# Patient Record
Sex: Female | Born: 1950 | Race: White | Hispanic: No | Marital: Married | Smoking: Former smoker
Health system: Southern US, Community
[De-identification: ages and names within clinical notes are randomized; demographics above are authoritative.]

## PROBLEM LIST (undated history)

## (undated) DIAGNOSIS — I059 Rheumatic mitral valve disease, unspecified: Secondary | ICD-10-CM

## (undated) DIAGNOSIS — I080 Rheumatic disorders of both mitral and aortic valves: Secondary | ICD-10-CM

## (undated) DIAGNOSIS — E785 Hyperlipidemia, unspecified: Secondary | ICD-10-CM

## (undated) HISTORY — DX: Rheumatic disorders of both mitral and aortic valves: I08.0

## (undated) HISTORY — PX: ABDOMINAL HYSTERECTOMY: SHX81

## (undated) HISTORY — DX: Rheumatic mitral valve disease, unspecified: I05.9

## (undated) HISTORY — PX: BREAST BIOPSY: SHX20

## (undated) HISTORY — DX: Hyperlipidemia, unspecified: E78.5

## (undated) HISTORY — PX: BREAST EXCISIONAL BIOPSY: SUR124

## (undated) HISTORY — PX: CERVICAL SPINE SURGERY: SHX589

---

## 2004-12-05 ENCOUNTER — Ambulatory Visit: Payer: Self-pay | Admitting: Family Medicine

## 2005-08-20 ENCOUNTER — Encounter: Payer: Self-pay | Admitting: Emergency Medicine

## 2005-08-20 ENCOUNTER — Ambulatory Visit: Payer: Self-pay | Admitting: Cardiology

## 2005-08-21 ENCOUNTER — Inpatient Hospital Stay (HOSPITAL_COMMUNITY): Admission: AD | Admit: 2005-08-21 | Discharge: 2005-08-22 | Payer: Self-pay | Admitting: Cardiology

## 2005-08-21 ENCOUNTER — Encounter: Payer: Self-pay | Admitting: Internal Medicine

## 2005-08-27 ENCOUNTER — Ambulatory Visit: Payer: Self-pay | Admitting: Cardiology

## 2005-09-29 ENCOUNTER — Ambulatory Visit: Payer: Self-pay | Admitting: Cardiology

## 2005-12-11 ENCOUNTER — Ambulatory Visit: Payer: Self-pay

## 2006-10-09 DIAGNOSIS — Z79890 Hormone replacement therapy (postmenopausal): Secondary | ICD-10-CM | POA: Insufficient documentation

## 2006-10-09 DIAGNOSIS — M810 Age-related osteoporosis without current pathological fracture: Secondary | ICD-10-CM | POA: Insufficient documentation

## 2006-10-23 ENCOUNTER — Ambulatory Visit: Payer: Self-pay | Admitting: Cardiology

## 2006-12-15 ENCOUNTER — Ambulatory Visit: Payer: Self-pay

## 2007-05-19 ENCOUNTER — Ambulatory Visit: Payer: Self-pay | Admitting: Gastroenterology

## 2008-01-18 ENCOUNTER — Ambulatory Visit: Payer: Self-pay

## 2009-01-02 ENCOUNTER — Ambulatory Visit: Payer: Self-pay | Admitting: Family Medicine

## 2009-01-02 DIAGNOSIS — M6530 Trigger finger, unspecified finger: Secondary | ICD-10-CM | POA: Insufficient documentation

## 2009-01-18 ENCOUNTER — Ambulatory Visit: Payer: Self-pay | Admitting: Family Medicine

## 2009-03-19 DIAGNOSIS — I8390 Asymptomatic varicose veins of unspecified lower extremity: Secondary | ICD-10-CM | POA: Insufficient documentation

## 2009-10-19 ENCOUNTER — Encounter: Payer: Self-pay | Admitting: Cardiology

## 2009-10-30 ENCOUNTER — Encounter: Payer: Self-pay | Admitting: Cardiology

## 2009-11-01 DIAGNOSIS — I519 Heart disease, unspecified: Secondary | ICD-10-CM | POA: Insufficient documentation

## 2009-12-14 ENCOUNTER — Ambulatory Visit: Payer: Self-pay | Admitting: Family Medicine

## 2009-12-24 DIAGNOSIS — I4891 Unspecified atrial fibrillation: Secondary | ICD-10-CM | POA: Insufficient documentation

## 2009-12-24 DIAGNOSIS — I059 Rheumatic mitral valve disease, unspecified: Secondary | ICD-10-CM | POA: Insufficient documentation

## 2009-12-24 DIAGNOSIS — I341 Nonrheumatic mitral (valve) prolapse: Secondary | ICD-10-CM | POA: Insufficient documentation

## 2009-12-26 ENCOUNTER — Ambulatory Visit: Payer: Self-pay | Admitting: Cardiology

## 2009-12-26 DIAGNOSIS — I080 Rheumatic disorders of both mitral and aortic valves: Secondary | ICD-10-CM | POA: Insufficient documentation

## 2009-12-26 DIAGNOSIS — I428 Other cardiomyopathies: Secondary | ICD-10-CM | POA: Insufficient documentation

## 2010-01-03 ENCOUNTER — Ambulatory Visit: Payer: Self-pay | Admitting: Internal Medicine

## 2010-01-03 ENCOUNTER — Encounter: Payer: Self-pay | Admitting: Internal Medicine

## 2010-01-03 ENCOUNTER — Ambulatory Visit (HOSPITAL_COMMUNITY): Admission: RE | Admit: 2010-01-03 | Discharge: 2010-01-03 | Payer: Self-pay | Admitting: Internal Medicine

## 2010-01-09 ENCOUNTER — Telehealth: Payer: Self-pay | Admitting: Cardiology

## 2010-01-22 ENCOUNTER — Ambulatory Visit: Payer: Self-pay

## 2010-01-24 ENCOUNTER — Ambulatory Visit: Payer: Self-pay

## 2010-03-04 ENCOUNTER — Telehealth: Payer: Self-pay | Admitting: Cardiology

## 2010-04-09 ENCOUNTER — Ambulatory Visit: Payer: Self-pay | Admitting: Family Medicine

## 2010-04-15 ENCOUNTER — Ambulatory Visit: Payer: Self-pay | Admitting: Family Medicine

## 2010-04-16 DIAGNOSIS — M5412 Radiculopathy, cervical region: Secondary | ICD-10-CM | POA: Insufficient documentation

## 2010-04-17 ENCOUNTER — Telehealth: Payer: Self-pay | Admitting: Cardiology

## 2010-06-18 ENCOUNTER — Encounter: Payer: Self-pay | Admitting: Cardiology

## 2010-06-20 ENCOUNTER — Ambulatory Visit: Payer: Self-pay | Admitting: Cardiology

## 2010-07-02 ENCOUNTER — Ambulatory Visit: Payer: Self-pay

## 2010-07-02 ENCOUNTER — Ambulatory Visit: Payer: Self-pay | Admitting: Internal Medicine

## 2010-07-02 ENCOUNTER — Ambulatory Visit (HOSPITAL_COMMUNITY): Admission: RE | Admit: 2010-07-02 | Discharge: 2010-07-02 | Payer: Self-pay | Admitting: Cardiology

## 2010-07-02 ENCOUNTER — Encounter: Payer: Self-pay | Admitting: Cardiology

## 2010-07-29 ENCOUNTER — Telehealth: Payer: Self-pay | Admitting: Cardiology

## 2010-08-29 ENCOUNTER — Ambulatory Visit: Payer: Self-pay | Admitting: General Surgery

## 2010-10-15 NOTE — Miscellaneous (Signed)
  Clinical Lists Changes  Observations: Added new observation of TEEFINDING: - Left ventricle: Systolic function was normal. The estimated   ejection fraction was in the range of 50% to 55%. Hypokinesis of   the anteroseptal myocardium. - Mitral valve: Mild prolapse, involving the anterior leaflet and   the posterior leaflet. Mild regurgitation. - Atrial septum: There was a small patent foramen ovale. There was   an atrial septal aneurysm, predominantly within the right atrial   cavity. - Tricuspid valve: No evidence of vegetation. (01/03/2010 14:43)      TE Echocardiogram  Procedure date:  01/03/2010  Findings:      - Left ventricle: Systolic function was normal. The estimated   ejection fraction was in the range of 50% to 55%. Hypokinesis of   the anteroseptal myocardium. - Mitral valve: Mild prolapse, involving the anterior leaflet and   the posterior leaflet. Mild regurgitation. - Atrial septum: There was a small patent foramen ovale. There was   an atrial septal aneurysm, predominantly within the right atrial   cavity. - Tricuspid valve: No evidence of vegetation.

## 2010-10-15 NOTE — Progress Notes (Signed)
Summary: [facility] to review TEE results/waiting on report review from [facility]  Phone Note Call from Patient Call back at Home Phone [phone]   Caller: Patient Reason for Call: Talk to Nurse, Talk to Doctor, Lab or Test Results Summary of Call: pt would like TEE results she had this last Thursday and she was wondering when and who her next f/u would be with. If no one calls today please call after 4pm cause thats when she gets home Initial call taken by: Omer Jack,  January 09, 2010 4:14 PM  Follow-up for Phone Call        Talked with Mrs. Hardgrove and told her the results of her TEE were not available yet.   I told her Dr. Lindaann Slough nurse, Elita Quick, would call her with those results as well as if she needed a follow-up appt with Dr. Antoine Poche or her pcp.  Follow-up by: Lisabeth Devoid RN,  January 09, 2010 4:32 PM  Additional Follow-up for Phone Call Additional follow up Details #1::        received TEE results and taken to Dr Antoine Poche for review.  Sander Nephew, RN    Additional Follow-up for Phone Call Additional follow up Details #2::    I called the patient with results.  MR mild/mod.  I will follow with TTE in six months. Follow-up by: Rollene Rotunda, MD, [facility],  Jan 15, 2010 5:53 PM

## 2010-10-15 NOTE — Letter (Signed)
Summary: Cardioversion/TEE Instructions  Architectural technologist, Main Office  [address]   [place], [place] [address]   Phone: [phone]  Fax: [phone]        Cardioversion / TEE Cardioversion Instructions  You are scheduled for a Cardioversion / TEE Cardioversion on ____________________________ with Dr. _________________________________________.   Please arrive at the [facility] at _____________ a.m. / p.m. on the day of your procedure.  1)   DIET:  A)   Nothing to eat or drink after midnight except your medications with a sip of water.  B)   YOU MAY TAKE ALL of your remaining medications with a small amount of water.  2)  Must have a responsible person to drive you home.  3)   Bring a current list of your medications and current insurance cards.   * Special Note:  Every effort is made to have your procedure done on time. Occasionally there are emergencies that present themselves at the hospital that may cause delays. Please be patient if a delay does occur.  * If you have any questions after you get home, please call the office at [phone].

## 2010-10-15 NOTE — Assessment & Plan Note (Signed)
Summary: f53m   Visit Type:  Follow-up Primary Provider:  Maurine Minister Chrismon PA-C  CC:  Atrial Fib, MR, and MVP.  History of Present Illness: The patient presents for followup of mitral valve prolapse with mitral regurgitation. This summer she had a transesophageal ultrasound to followup a transthoracic echo done at [place]. The transesophageal suggested mild mitral regurgitation which contradicted to some degree the transthoracic suggesting moderate MR with a reduced EF. Ejection fraction by TTE was low normal at 50%. She does have some mild anterior leaf prolapse. The patient also has a history of atrial fibrillation. However, she has had no recurrent tachycardia palpitations and no suggestion of recurrent arrhythmia. She denies any new shortness of breath, PND or orthopnea. She has had no chest pressure, neck or arm discomfort.  Current Medications (verified): 1)  Propranolol Hcl 40 Mg Tabs (Propranolol Hcl) .Marland Kitchen.. 1 By Mouth Two Times A Day 2)  Diltiazem Hcl 120 Mg Tabs (Diltiazem Hcl) .Marland Kitchen.. 1 Po Daily 3)  Aspirin 325 Mg  Tabs (Aspirin) .Marland Kitchen.. 1 By Mouth Daily 4)  Vitamin D 2000 Unit Tabs (Cholecalciferol) .Marland Kitchen.. 1 By Mouth Daily 5)  Calcitonin (Salmon) 200 Unit/act Soln (Calcitonin (Salmon)) .... As Directed 6)  Stress Vitamin With Iron .Marland Kitchen.. 1 By Mouth Daily  Allergies (verified): 1)  ! Sulfa  Past History:  Past Medical History: Reviewed history from 12/26/2009 and no changes required.  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  Hysterectomy.  Review of Systems       As stated in the HPI and negative for all other systems.   Vital Signs:  Patient profile:   60 year old female Height:      66 inches Weight:      136 pounds BMI:     22.03 Pulse rate:   66 / minute Resp:     16 per minute BP  sitting:   102 / 60  (right arm)  Vitals Entered By: Marrion Coy, CNA (June 20, 2010 4:12 PM)  Physical Exam  General:  Well developed, well nourished, in no acute distress. Head:  normocephalic and atraumatic Eyes:  PERRLA/EOM intact; conjunctiva and lids normal. Neck:  Neck supple, no JVD. No masses, thyromegaly or abnormal cervical nodes. Chest Wall:  no deformities or breast masses noted Lungs:  Clear bilaterally to auscultation and percussion. Abdomen:  Bowel sounds positive; abdomen soft and non-tender without masses, organomegaly, or hernias noted. No hepatosplenomegaly. Msk:  Back normal, normal gait. Muscle strength and tone normal. Skin:  Intact without lesions or rashes. Cervical Nodes:  no significant adenopathy Axillary Nodes:  no significant adenopathy Inguinal Nodes:  no significant adenopathy Psych:  Normal affect.   Detailed Cardiovascular Exam  Neck    Carotids: Carotids full and equal bilaterally without bruits.      Neck Veins: Normal, no JVD.    Heart    Inspection: no deformities or lifts noted.      Palpation: normal PMI with no thrills palpable.      Auscultation: regular rate and rhythm, S1, S2 without murmurs, rubs, gallops, or clicks.    Vascular    Abdominal Aorta: no palpable masses, pulsations, or audible bruits.      Femoral Pulses: normal femoral pulses bilaterally.      Pedal Pulses: normal pedal pulses bilaterally.      Radial Pulses: normal radial pulses bilaterally.  Peripheral Circulation: no clubbing, cyanosis, or edema noted with normal capillary refill.     EKG  Procedure date:  06/20/2010  Findings:      Sinus rhythm, rate 68, axis within normal limits, intervals within normal limits, no acute ST-T wave changes.  Impression & Recommendations:  Problem # 1:  MITRAL INSUFFICIENCY (ICD-396.3) I will order a transthoracic echocardiogram to further evaluate this. If it continues to be mild I will follow this in one year.  Her discussed the anatomy and physiology in detail today. Orders: Echocardiogram (Echo)  Problem # 2:  FIBRILLATION, ATRIAL (ICD-427.31) She has had no recurrent tachycardia arrhythmias. Further evaluation is planned. Orders: EKG w/ Interpretation (93000)  Problem # 3:  CARDIOMYOPATHY (ICD-425.4) Her EF is low normal and she has no symptoms. No change in therapy is planned.  Patient Instructions: 1)  Your physician recommends that you schedule a follow-up appointment in: 12 months with Dr Antoine Poche 2)  Your physician recommends that you continue on your current medications as directed. Please refer to the Current Medication list given to you today. 3)  Your physician has requested that you have an echocardiogram.  Echocardiography is a painless test that uses sound waves to create images of your heart. It provides your doctor with information about the size and shape of your heart and how well your heart's chambers and valves are working.  This procedure takes approximately one hour. There are no restrictions for this procedure.

## 2010-10-15 NOTE — Progress Notes (Signed)
Summary: is pt scheduled to soon  Phone Note Call from Patient Call back at Home Phone [phone]   Caller: Patient Reason for Call: Talk to Nurse, Talk to Doctor Summary of Call: pt is scheduled for july 19th but she thinks this appt is too soon. I read to the last office note that said pt to schedule appt after TEE but she thought it said 6 months but I see no time frame Initial call taken by: Omer Jack,  March 04, 2010 11:56 AM  Follow-up for Phone Call        per TEE Dr Antoine Poche said f/u in 6 months, pt aware cx'd July appt put pt in for reminder for Oct. Meredith Staggers, RN  March 04, 2010 12:11 PM

## 2010-10-15 NOTE — Progress Notes (Signed)
Summary: Question about having dental work  Phone Note Call from Patient Call back at [employer] [phone]   Caller: Patient Summary of Call: Pt having dental work need to know if she needs premeds Initial call taken by: Judie Grieve,  April 17, 2010 1:28 PM  Follow-up for Phone Call        Pt. called to find out if she needs antibiotics prior dental work. Pt. states she has been diagnosed with mitral valve prolapse and mild regurgutation. I let pt. know according to the guidelines she will need antibiotics only if she has had a mitral valve repair or replacement. Pt. verbalized understanding. Follow-up by: Ollen Gross, RN, BSN,  April 17, 2010 2:55 PM

## 2010-10-15 NOTE — Assessment & Plan Note (Signed)
Summary: f3y/abn echo/mitral insuffiency   Visit Type:  Initial Consult Primary Provider:  Dortha Kern PA-C  CC:  MR.  History of Present Illness: The patient presents for evaluation of an abnormal echocardiogram. I saw her greater than 3 years ago after she was treated for atrial fibrillation requiring cardioversion. She had a history of mitral valve prolapse at that time but no regurgitation. She has not seen [place] in followup as there had been no indication. She has had no symptomatic recurrence of atrial fibrillation. She will get some rare palpitations. These are not sustained and not similar to her previous period  Recently she had a followup echocardiogram. This demonstrated mild global left ventricular dysfunction with an ejection fraction of 40%, moderate left atrial enlargement and moderate to severe mitral insufficiency. There was an atrial septal aneurysm which had previously been described. There was mild prolapse. She is referred for evaluation of this.  She gets along well working full time. She gets tired at work but this is not new. She does not have any shortness of breath and denies any PND or orthopnea. She does not have any chest discomfort, neck or arm discomfort. She does not have any palpitations, presyncope or syncope.  Current Medications (verified): 1)  Propranolol Hcl 40 Mg Tabs (Propranolol Hcl) .Marland Kitchen.. 1 By Mouth Two Times A Day 2)  Diltiazem Hcl 120 Mg Tabs (Diltiazem Hcl) .Marland Kitchen.. 1 Po Daily 3)  Aspirin 325 Mg  Tabs (Aspirin) .Marland Kitchen.. 1 By Mouth Daily 4)  Vitamin D 2000 Unit Tabs (Cholecalciferol) .Marland Kitchen.. 1 By Mouth Daily 5)  Calcitonin (Salmon) 200 Unit/act Soln (Calcitonin (Salmon)) .... As Directed 6)  Stress Vitamin With Iron .Marland Kitchen.. 1 By Mouth Daily  Allergies (verified): 1)  ! 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  Hysterectomy.  Family History: Reviewed history from 12/24/2009 and no changes required.  Contributory for coronary disease in later age. She does  have a family history of her dad and sister having thyroid problems.  Social History: Reviewed history from 12/24/2009 and no changes required.  The patient is married. She has 2 children. One grandchild.  She never smoked cigarettes.  Review of Systems       Positive for occasional leg cramping, reflux. Otherwise as stated in the history of present illness negative for all other systems.  Vital Signs:  Patient profile:   60 year old female Height:      66 inches Weight:      133 pounds BMI:     21.54 Pulse rate:   63 / minute Resp:     16 per minute BP sitting:   114 / 71  (left arm)  Vitals Entered By: Marrion Coy, CNA (December 26, 2009 4:20 PM)  Physical Exam  General:  Well developed, well nourished, in no acute distress. Head:  normocephalic and atraumatic Eyes:  PERRLA/EOM intact; conjunctiva and lids normal. Mouth:  Teeth, gums and palate normal. Oral mucosa normal. Neck:  Neck supple, no JVD. No masses, thyromegaly or abnormal cervical nodes. Chest Wall:  no deformities or breast masses noted Lungs:  Clear bilaterally to auscultation and percussion. Abdomen:  Bowel sounds positive; abdomen soft and non-tender without masses, organomegaly, or hernias noted. No hepatosplenomegaly. Msk:  Back normal, normal gait. Muscle strength and tone normal. Extremities:  No clubbing  or cyanosis. Neurologic:  Alert and oriented x 3. Skin:  Intact without lesions or rashes. Cervical Nodes:  no significant adenopathy Axillary Nodes:  no significant adenopathy Inguinal Nodes:  no significant adenopathy Psych:  Normal affect.   Detailed Cardiovascular Exam  Neck    Carotids: Carotids full and equal bilaterally without bruits.       Neck Veins: Normal, no JVD.    Heart    Inspection: no deformities or lifts noted.      Palpation: normal PMI with no thrills palpable.      Auscultation: regular rate and rhythm, S1, S2 without murmurs, rubs, gallops, or clicks.    Vascular    Abdominal Aorta: no palpable masses, pulsations, or audible bruits.      Femoral Pulses: normal femoral pulses bilaterally.      Pedal Pulses: normal pedal pulses bilaterally.      Radial Pulses: normal radial pulses bilaterally.      Peripheral Circulation: no clubbing, cyanosis, or edema noted with normal capillary refill.     EKG  Procedure date:  12/26/2009  Findings:      Sinus rhythm, rate 59, axis within normal limits, intervals within normal limits, no acute ST-T wave changes.  Impression & Recommendations:  Problem # 1:  MITRAL INSUFFICIENCY (ICD-396.3) The patient has mitral regurgitation identified on an outside echocardiogram done in [place]. I actually do not appreciate a murmur or a mitral valve prolapse click. However, given this finding on echo the next step is a transesophageal echocardiogram. She has no contraindications and I have discussed this with her. We will schedule this.  Problem # 2:  CARDIOMYOPATHY (ICD-425.4) We can reassess her ejection fraction at the time of the transesophageal echocardiogram. I will then prescribed therapy as indicated.  Problem # 3:  FIBRILLATION, ATRIAL (ICD-427.31) She has had no symptomatic recurrence of this. No further therapy is indicated.  Patient Instructions: 1)  Your physician recommends that you schedule a follow-up appointment AFTER TEE 2)  Your physician recommends that you continue on your current medications as directed. Please refer to the Current Medication list given to you today. 3)  Your physician has requested that you have a TEE/Cardioversion.  During a TEE, sound waves are used to create images of your heart. It provides your doctor with information about the size  and shape of your heart and how well your heart's chambers and valves are working. In this test, a transducer is attached to the end of a flexible tube that is guided down your throat and into your esophagus (the tube leading from your mouth to your stomach) to get a more detailed image of your heart. Once the TEE has determined that a blood clot is not present, the cardioversion begins.  Electrical cardioversion uses a jolt of electricity to your heart either through paddles or wired patches attached to your chest. This is a controlled, usually prescheduled, procedure. This procedure is done at the hospital and you are not awake during the procedure.  You usually go home the day of the procedure. Please see the instruction sheet given to you today for more information.  TO BE SCHEDULED

## 2010-10-15 NOTE — Progress Notes (Signed)
Summary: results of echo  Phone Note Call from Patient   Caller: Patient [phone] Reason for Call: Talk to Nurse, Lab or Test Results Summary of Call: pt calling re results of echo Initial call taken by: Glynda Jaeger,  July 29, 2010 4:23 PM  Follow-up for Phone Call        [facility] Katina Dung, RN, BSN  July 29, 2010 4:45 PM --I talked with pt

## 2010-10-15 NOTE — Consult Note (Signed)
Summary: [facility]   Imported By: Marylou Mccoy 02/07/2010 15:33:16  _____________________________________________________________________  External Attachment:    Type:   Image     Comment:   External Document

## 2011-01-31 NOTE — H&P (Signed)
Kristina Jordan, Kristina Jordan                ACCOUNT NO.:  [id number]   MEDICAL RECORD NO.:  [id number]          PATIENT TYPE:  EMS   LOCATION:  ED                           FACILITY:  [facility]   PHYSICIAN:  Rollene Rotunda, M.D.   DATE OF BIRTH:  [birth year]   DATE OF ADMISSION:  08/20/2005  DATE OF DISCHARGE:                                HISTORY & PHYSICAL   PRIMARY CARE PHYSICIAN:  Julieanne Manson, MD.   CARDIOLOGIST:  None.   REASON FOR PRESENTATION:  Patient with palpitations.   HISTORY OF PRESENT ILLNESS:  The patient is a very pleasant 60 year old  white female with a past history of mitral valve prolapse. She also has a  history of some dizziness that was evaluated with catheterization some time  ago. She was told she had normal coronary arteries.   Today she was playing with her new grandchild. She developed sudden  palpitations. Her daughter who is a Engineer, civil (consulting) noted her heart rate to be  irregular and suggested she come to the ER where she was noted to be in  atrial fibrillation with a rapid ventricular response. She had some mild  shortness of breath. She had some fleeting sharp chest discomfort. However,  she had no substernal chest pressure and no neck or arm discomfort. She had  no PND or orthopnea. She did not have any presyncope or syncope. Since  coming to the emergency room and having her heart rate slowed down with a  little IV diltiazem, she has had resolution of her symptoms though she  remains in atrial fibrillation. She has not noticed these before. She has  had isolated skipped beats over the years but these have never been  particularly problematic. Otherwise she is active and denies any  cardiovascular symptoms. In particular, she denies any chest discomfort,  neck discomfort, arm discomfort with activity, any nausea, vomiting, or  excessive diaphoresis.   PAST MEDICAL HISTORY:  Osteoporosis, mitral valve prolapse.   PAST SURGICAL HISTORY:  Hysterectomy.   ALLERGIES:  Questionably to sulfa.   MEDICATIONS:  1.  Propranolol 40 mg b.i.d.  2.  Premarin.  3.  Miacalcin nasal spray.  4.  Calcium.  5.  Vitamin D.   SOCIAL HISTORY:  The patient is married. She has 2 children. One grandchild.  She never smoked cigarettes.   FAMILY HISTORY:  Contributory for coronary disease in later age. She does  have a family history of her dad and sister having thyroid problems.   REVIEW OF SYMPTOMS:  Positive for recent URI treated with Z-pack. She has  had a dry nonprotective cough for quite a while. She has cold intolerance  that is chronic.   PHYSICAL EXAMINATION:  GENERAL:  The patient is in no distress.  VITAL SIGNS:  Blood pressure 103/77, heart rate 95 and irregular.  HEENT:  Eyes unremarkable. Pupils equal round and reactive to light, fundi  not visualized. Oral mucosa unremarkable.  NECK:  No jugular venous distention, wave form within normal limits, carotid  upstroke brisk and symmetric. No bruits, no thyromegaly.  LYMPHATICS:  No cervical, axillary or  inguinal adenopathy.  LUNGS:  Clear to auscultation bilaterally.  BACK:  No costovertebral angle tenderness.  CHEST:  Unremarkable.  HEART:  PMI not displaced with sustained, S1 and S2 within normal limits, no  S3, no murmurs.  ABDOMEN:  Flat, positive bowel sounds, normal in frequency and pitch, no  bruits, rebound, guarding, no midline pulsatile mass, no hepatosplenomegaly  or splenomegaly.  SKIN:  No rashes, no nodules.  EXTREMITIES:  2+ pulses throughout. No edema, no cyanosis, no clubbing.  NEUROLOGIC:  Oriented to person, place and time. Cranial nerves II-XII  grossly intact, motor grossly intact.   EKG, atrial fibrillation, acts within normal limits. Interval is within  normal limits, nonspecific inferior ST depression.   Chest x-ray questionable emphysema.   LABORATORY DATA:  WBC 5.6, hemoglobin 12.4, platelets 211, sodium 139,  potassium 3.6, chloride 106, BUN 14, creatinine  0.8, CK-MB 1.2, troponin  less than 0.05.   IMPRESSION:  1.  Atrial fibrillation, the patient had new onset atrial fibrillation. She      does have a history of mitral valve prolapse. At this point will check      an echocardiogram. Will follow her on telemetry. I will start heparin.      Will check a TSH. If her echocardiogram is unremarkable, she could be      considered to have lone atrial fibrillation and not require long-term      anticoagulation. If she does not convert spontaneously, she would need      cardioversion before discharge. Will rate control with Cardizem. I will      restart her propranolol. She could have a higher dose of propranolol at      discharge if her blood pressure tolerates for rate control.  2.  Mitral valve prolapse. Again this will be assessed with the      echocardiogram.  3.  Osteoporosis. Will continue her Miacalcin, vitamin D and calcium.           ______________________________  Rollene Rotunda, M.D.     JH/MEDQ  D:  08/21/2005  T:  08/21/2005  Job:  [id number]   cc:   Julieanne Manson  Fax: [phone]

## 2011-01-31 NOTE — Assessment & Plan Note (Signed)
[facility]                            CARDIOLOGY OFFICE NOTE   NAME:Kristina Jordan, Kristina Jordan                       MRN:          [id number]  DATE:10/23/2006                            DOB:          16-Mar-1951    PRIMARY CARE PHYSICIAN:  Dr. Julieanne Manson.   REASON FOR PRESENTATION:  The patient with atrial fibrillation.   HISTORY OF PRESENT ILLNESS:  The patient presents for followup of her  atrial fibrillation that required cardioversion in December. Since that  time, she has had no further sustained tachy palpitations. She may  occasionally feel her heart fluttering, but has had no significant  symptoms. She has had no pre-syncope or syncope. She denies any chest  pain. She has had no shortness of breath.   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   ALLERGIES:  Questionable allergy to SULFA.   MEDICATIONS:  1. Propranolol 40 mg b.i.d.  2. Aspirin 225 mg daily.  3. Calcium.  4. Miacalcin.  5. Premarin.  6. Multivitamin.  7. Cartia 100 mg daily.   REVIEW OF SYSTEMS:  As stated in the history of present illness,  otherwise negative for other systems.   PHYSICAL EXAMINATION:  GENERAL: The patient is in no distress.  VITAL SIGNS: Blood pressure 116/72, heart rate 62 and regular, weight  125 pounds, body mass index 20.  HEENT:  Eyelids unremarkable. Pupils equal, round, and reactive to  light, fundi are visualized, oral mucosa unremarkable.  NECK: No jugular venous distension at 45 degrees, carotid upstroke brisk  and symmetrical no bruits, no thyromegaly.  LYMPHATICS: No cervical, maxillary, inguinal adenopathy.  LUNGS: Clear to auscultation bilaterally.  BACK: No costovertebral angle tenderness.  CHEST: Unremarkable.  HEART: PMI not displaced or sustained, S1 and S2 within normal limits,  no S3, no S4, no  clicks, no rubs, no murmurs.  ABDOMEN: Flat, positive bowel sounds normal to frequency and pitch, no  bruits, no rebound, no guarding, no midline pulsatile mass, no  hepatomegaly or splenomegaly.  SKIN: No rashes, nodules.  EXTREMITIES: 2+ pulses, no cyanosis, clubbing, or edema.  NEUROLOGIC: Oriented to person, time and place, cranial nerves 2-12 are  grossly intact, motor grossly intact.   EKG sinus rhythm, rate 62, axis within normal limits, intervals within  normal limits, no acute STT wave changes.   IMPRESSION:  1. Atrial fibrillation. The patient has had no sustained paroxysms      with this. No further cardiovascular testing is suggested. She will      continue on the propranolol and the [place]. If she has any problems      with tachy palpitations she is to call our office.  2. Mitral valve prolapse, this is very mild. There is no evidence of      significant regurgitation. I do not hear any on physical      examination. This can be followed clinically.  3. Followup. We will see her back in this office or in our [facility]  office as needed.     Rollene Rotunda, MD, [facility]  Electronically Signed    JH/MedQ  DD: 10/23/2006  DT: 10/24/2006  Job #: [id number]   cc:   Julieanne Manson

## 2011-01-31 NOTE — Discharge Summary (Signed)
Kristina Jordan, Kristina Jordan                ACCOUNT NO.:  [id number]   MEDICAL RECORD NO.:  [id number]          PATIENT TYPE:  INP   LOCATION:  2030                         FACILITY:  [facility]   PHYSICIAN:  Maisie Fus Long            DATE OF BIRTH:  03-Jul-1951   DATE OF ADMISSION:  08/21/2005  DATE OF DISCHARGE:  08/22/2005                                 DISCHARGE SUMMARY   PRIMARY DIAGNOSIS:  Paroxysmal atrial fibrillation.   SECONDARY DIAGNOSES:  1.  History of mitral valve prolapse.  2.  Reported history of catheterization in the past that was normal.  No      further details available.  3.  History of osteoporosis.  4.  Status post hysterectomy.  5.  Anti-coagulation with aspirin, begun this admission.   ALLERGIES:  POSSIBLY SULFA.   PROCEDURE:  Direct current cardioversion.   HOSPITAL COURSE:  Kristina Jordan is a 60 year old female with no known history  of coronary artery disease.  She also has no history of arhythmia.  On the  day of admission, she was playing with her grandchild and developed  palpitations.  Her daughter, who is a Engineer, civil (consulting), noted that her heart beat was  irregular and when she came to the Emergency Room she was in atrial  fibrillation with rapid ventricular response.  Her heart rate slowed some  with IV Diltiazem and her symptoms resolved, but she remained in atrial  fibrillation.  She was admitted for further evaluation and treatment.   The next day, her potassium was 3.6.  Her point of care markers were  negative and her TSH was within normal limits at 0.786.  A 2-D  echocardiogram showed an EF of 55 to 65% with the mitral valve mildly  thickened with a mild myxomatous appearance and minimal mitral valve  prolapse noted only in the apical views.  The interatrial septum bowed from  left to right consistent with increased left atrial pressure and there was  an atrial septal aneurysm.  There was no paracardial effusion, and no other  valvular abnormalities.   On August 22, 2005, Kristina Jordan was evaluated by Dr. Daleen Squibb.  He felt that  cardioversion was the best option for her.  Anesthesia sedated her, and she  was cardioverted with 120 joules to normal sinus rhythm.  She is to start  Diltiazem SR 120 mg q. day, and an aspirin a day.  On August 22, 2005-p.m.,  she was maintaining sinus rhythm and considered stable for discharge with  outpatient follow-up arranged.   DISCHARGE INSTRUCTIONS:  Her activities be increased gradually.  She is to  stick to a low-fat diet.  She is to use steroid cream on the cardioversion  marks.  She is to follow-up with Dr. Jenene Slicker PA, or NP on August 27, 2005 at 8:45.   DISCHARGE MEDICATIONS:  1.  Diltiazem SR 120 mg q. day.  2.  Aspirin 325 mg a day.      Theodore Demark, P.A. [facility]    ______________________________  [name]    RB/MEDQ  D:  08/22/2005  T:  08/23/2005  Job:  [id number]   cc:   Julieanne Manson  Fax: [phone]

## 2011-01-31 NOTE — Discharge Summary (Signed)
NAMEANGELIA, Kristina Jordan                ACCOUNT NO.:  [id number]   MEDICAL RECORD NO.:  [id number]          PATIENT TYPE:  INP   LOCATION:  2030                         FACILITY:  [facility]   PHYSICIAN:  Jesse Sans. Wall, M.D.   DATE OF BIRTH:  12/29/1950   DATE OF ADMISSION:  08/21/2005  DATE OF DISCHARGE:                                 DISCHARGE SUMMARY   PROCEDURE:  Direct current cardioversion.   INDICATIONS FOR PROCEDURE:  New onset atrial fibrillation not converting  with intravenous Diltiazem and beta blockers.  The patient has a history of  mitral valve prolapse.  Echocardiogram shows trivial mitral regurgitation  with mild mitral valve prolapse.  Informed consent obtained.   DESCRIPTION OF PROCEDURE:  Dr. Roxine Caddy anesthetized the patient with 70 mg  IV Pentothal.  120 joules of biphasic current administered converting the  patient to sinus tach with PVCs.  She is now in sinus rhythm.  There were no  complications.   PLAN:  1.  Check 12 lead EKG.  2.  Diltiazem XR 180 mg p.o. now.  Will discontinue IV Diltiazem in one      hour.  3.  Continue Propranolol 40 mg b.i.d.  She was on this prior to admission.  4.  Discontinue heparin.  5.  Increase activity.  6.  Aspirin 325 mg p.o. daily then q.a.m.  7.  Discharge home later on today.  8.  Will schedule follow up with Dr. Rollene Rotunda.      Thomas C. Wall, M.D.  Electronically Signed     TCW/MEDQ  D:  08/22/2005  T:  08/22/2005  Job:  [id number]   cc:   Rollene Rotunda, M.D.  [address]  [address]  [place]  [place] [address]   Julieanne Manson  Fax: [phone]

## 2011-02-19 ENCOUNTER — Ambulatory Visit: Payer: Self-pay | Admitting: Family Medicine

## 2011-07-14 ENCOUNTER — Encounter: Payer: Self-pay | Admitting: *Deleted

## 2011-07-15 ENCOUNTER — Ambulatory Visit (INDEPENDENT_AMBULATORY_CARE_PROVIDER_SITE_OTHER): Payer: BC Managed Care – PPO | Admitting: Cardiology

## 2011-07-15 ENCOUNTER — Encounter: Payer: Self-pay | Admitting: Cardiology

## 2011-07-15 DIAGNOSIS — I4891 Unspecified atrial fibrillation: Secondary | ICD-10-CM

## 2011-07-15 DIAGNOSIS — I059 Rheumatic mitral valve disease, unspecified: Secondary | ICD-10-CM

## 2011-07-15 NOTE — Assessment & Plan Note (Signed)
I would like to follow this up with repeat echocardiography. Further treatment will be based on this result.

## 2011-07-15 NOTE — Patient Instructions (Signed)
Your physician has requested that you have an echocardiogram. Echocardiography is a painless test that uses sound waves to create images of your heart. It provides your doctor with information about the size and shape of your heart and how well your heart's chambers and valves are working. This procedure takes approximately one hour. There are no restrictions for this procedure.  Follow up in 1 year with Dr Hochrein.  You will receive a letter in the mail 2 months before you are due.  Please call us when you receive this letter to schedule your follow up appointment.  

## 2011-07-15 NOTE — Assessment & Plan Note (Signed)
She has had rare palpitations but no further episodes of fibrillation. No change in therapy is indicated.

## 2011-07-15 NOTE — Progress Notes (Signed)
HPI The patient presents for followup of an atrial septal aneurysm and mitral valve prolapse with mild MR. Since I last saw her she has had no new complaints. She has some rare palpitations. However, she has no presyncope or syncope. She denies any chest pressure, neck or arm discomfort. She has no new shortness of breath, PND or orthopnea. She doesn't exercise but she works a full-time job and walks they are without limitations. She's had no weight gain or edema.  Allergies  Allergen Reactions  . Sulfonamide Derivatives     Current Outpatient Prescriptions  Medication Sig Dispense Refill  . aspirin 325 MG tablet Take 325 mg by mouth daily.        . calcitonin, salmon, (MIACALCIN/FORTICAL) 200 UNIT/ACT nasal spray       . Cholecalciferol (VITAMIN D) 2000 UNITS CAPS Take 1 capsule by mouth daily.        Marland Kitchen diltiazem (CARDIZEM CD) 120 MG 24 hr capsule       . propranolol (INDERAL) 40 MG tablet Take 40 mg by mouth 2 (two) times daily.          Past Medical History  Diagnosis Date  . MITRAL VALVE PROLAPSE   . MITRAL INSUFFICIENCY   . FIBRILLATION, ATRIAL   . CARDIOMYOPATHY     Past Surgical History  Procedure Date  . Abdominal hysterectomy   . Cervical spine surgery     ROS:  As stated in the HPI and negative for all other systems.  PHYSICAL EXAM BP 103/59  Pulse 69  Resp 18  Ht 5\' 6"  (1.676 m)  Wt 136 lb (61.689 kg)  BMI 21.95 kg/m2 GENERAL:  Well appearing HEENT:  Pupils equal round and reactive, fundi not visualized, oral mucosa unremarkable NECK:  No jugular venous distention, waveform within normal limits, carotid upstroke brisk and symmetric, no bruits, no thyromegaly LYMPHATICS:  No cervical, inguinal adenopathy LUNGS:  Clear to auscultation bilaterally BACK:  No CVA tenderness CHEST:  Unremarkable HEART:  PMI not displaced or sustained,S1 and S2 within normal limits, no S3, no S4, no clicks, no rubs, no murmurs ABD:  Flat, positive bowel sounds normal in frequency  in pitch, no bruits, no rebound, no guarding, no midline pulsatile mass, no hepatomegaly, no splenomegaly EXT:  2 plus pulses throughout, no edema, no cyanosis no clubbing SKIN:  No rashes no nodules NEURO:  Cranial nerves II through XII grossly intact, motor grossly intact throughout PSYCH:  Cognitively intact, oriented to person place and time  EKG:  Sinus rhythm, rate 67, axis within normal limits, intervals within normal limits, no acute ST-T wave changes.  ASSESSMENT AND PLAN

## 2011-07-24 ENCOUNTER — Ambulatory Visit (HOSPITAL_COMMUNITY): Payer: BC Managed Care – PPO | Attending: Internal Medicine | Admitting: Radiology

## 2011-07-24 DIAGNOSIS — I059 Rheumatic mitral valve disease, unspecified: Secondary | ICD-10-CM | POA: Insufficient documentation

## 2011-07-24 DIAGNOSIS — I079 Rheumatic tricuspid valve disease, unspecified: Secondary | ICD-10-CM | POA: Insufficient documentation

## 2011-08-04 ENCOUNTER — Telehealth: Payer: Self-pay | Admitting: Cardiology

## 2011-08-04 NOTE — Telephone Encounter (Signed)
Reviewed echo results with pt

## 2011-08-04 NOTE — Telephone Encounter (Signed)
Pt calling about test results and you can leave message

## 2011-09-09 ENCOUNTER — Inpatient Hospital Stay (HOSPITAL_COMMUNITY)
Admission: EM | Admit: 2011-09-09 | Discharge: 2011-09-11 | DRG: 121 | Disposition: A | Discharge status: Home / Self Care | Payer: BC Managed Care – PPO | Attending: Internal Medicine | Admitting: Internal Medicine

## 2011-09-09 ENCOUNTER — Other Ambulatory Visit: Payer: Self-pay

## 2011-09-09 ENCOUNTER — Encounter (HOSPITAL_COMMUNITY): Payer: Self-pay

## 2011-09-09 ENCOUNTER — Emergency Department (HOSPITAL_COMMUNITY): Payer: BC Managed Care – PPO

## 2011-09-09 DIAGNOSIS — I428 Other cardiomyopathies: Secondary | ICD-10-CM | POA: Diagnosis present

## 2011-09-09 DIAGNOSIS — R0789 Other chest pain: Secondary | ICD-10-CM | POA: Diagnosis present

## 2011-09-09 DIAGNOSIS — I4891 Unspecified atrial fibrillation: Secondary | ICD-10-CM | POA: Diagnosis present

## 2011-09-09 DIAGNOSIS — I359 Nonrheumatic aortic valve disorder, unspecified: Secondary | ICD-10-CM | POA: Diagnosis present

## 2011-09-09 DIAGNOSIS — I341 Nonrheumatic mitral (valve) prolapse: Secondary | ICD-10-CM | POA: Diagnosis present

## 2011-09-09 DIAGNOSIS — I214 Non-ST elevation (NSTEMI) myocardial infarction: Principal | ICD-10-CM | POA: Diagnosis not present

## 2011-09-09 DIAGNOSIS — I059 Rheumatic mitral valve disease, unspecified: Secondary | ICD-10-CM | POA: Diagnosis present

## 2011-09-09 DIAGNOSIS — R079 Chest pain, unspecified: Secondary | ICD-10-CM

## 2011-09-09 DIAGNOSIS — Z7982 Long term (current) use of aspirin: Secondary | ICD-10-CM

## 2011-09-09 HISTORY — DX: Non-ST elevation (NSTEMI) myocardial infarction: I21.4

## 2011-09-09 LAB — URINALYSIS, ROUTINE W REFLEX MICROSCOPIC
Glucose, UA: NEGATIVE mg/dL
Protein, ur: NEGATIVE mg/dL

## 2011-09-09 LAB — DIFFERENTIAL
Basophils Relative: 0 % (ref 0–1)
Eosinophils Absolute: 0.4 10*3/uL (ref 0.0–0.7)
Eosinophils Relative: 6 % — ABNORMAL HIGH (ref 0–5)
Monocytes Absolute: 0.5 10*3/uL (ref 0.1–1.0)
Monocytes Relative: 9 % (ref 3–12)

## 2011-09-09 LAB — LIPID PANEL
Cholesterol: 156 mg/dL (ref 0–200)
HDL: 63 mg/dL (ref >39)
Total CHOL/HDL Ratio: 2.5 RATIO
Triglycerides: 144 mg/dL (ref <150)

## 2011-09-09 LAB — COMPREHENSIVE METABOLIC PANEL
Albumin: 3.4 g/dL — ABNORMAL LOW (ref 3.5–5.2)
BUN: 21 mg/dL (ref 6–23)
Chloride: 104 mEq/L (ref 96–112)
Creatinine, Ser: 0.77 mg/dL (ref 0.50–1.10)
Total Bilirubin: 0.2 mg/dL — ABNORMAL LOW (ref 0.3–1.2)
Total Protein: 6.7 g/dL (ref 6.0–8.3)

## 2011-09-09 LAB — CARDIAC PANEL(CRET KIN+CKTOT+MB+TROPI)
CK, MB: 5.9 ng/mL — ABNORMAL HIGH (ref 0.3–4.0)
Total CK: 70 U/L (ref 7–177)

## 2011-09-09 LAB — CBC
HCT: 35.8 % — ABNORMAL LOW (ref 36.0–46.0)
Hemoglobin: 11.8 g/dL — ABNORMAL LOW (ref 12.0–15.0)
MCH: 28.2 pg (ref 26.0–34.0)
MCHC: 33 g/dL (ref 30.0–36.0)
MCV: 85.6 fL (ref 78.0–100.0)

## 2011-09-09 LAB — URINE MICROSCOPIC-ADD ON

## 2011-09-09 LAB — TSH: TSH: 0.895 u[IU]/mL (ref 0.350–4.500)

## 2011-09-09 LAB — POCT I-STAT TROPONIN I

## 2011-09-09 MED ORDER — ONDANSETRON HCL 4 MG PO TABS: 4.0000 mg | ORAL_TABLET | Freq: Four times a day (QID) | ORAL | Status: DC | PRN

## 2011-09-09 MED ORDER — ONDANSETRON HCL 4 MG/2ML IJ SOLN: 4.0000 mg | Freq: Four times a day (QID) | INTRAMUSCULAR | Status: DC | PRN

## 2011-09-09 MED ORDER — CALCITONIN (SALMON) 200 UNIT/ACT NA SOLN
1.0000 | Freq: Every day | NASAL | Status: DC
  Administered 2011-09-09 – 2011-09-11 (×3): 1 via NASAL
  Filled 2011-09-09: qty 3.7

## 2011-09-09 MED ORDER — NITROGLYCERIN 0.4 MG SL SUBL: 0.4000 mg | SUBLINGUAL_TABLET | SUBLINGUAL | Status: DC | PRN

## 2011-09-09 MED ORDER — HYDROCODONE-ACETAMINOPHEN 5-325 MG PO TABS: 1.0000 | ORAL_TABLET | ORAL | Status: DC | PRN

## 2011-09-09 MED ORDER — HEPARIN SOD (PORCINE) IN D5W 100 UNIT/ML IV SOLN
800.0000 [IU]/h | INTRAVENOUS | Status: DC
  Administered 2011-09-09: 700 [IU]/h via INTRAVENOUS
  Administered 2011-09-10: 750 [IU]/h via INTRAVENOUS
  Filled 2011-09-09: qty 250

## 2011-09-09 MED ORDER — VITAMIN D 50 MCG (2000 UT) PO CAPS: 1.0000 | ORAL_CAPSULE | Freq: Every day | ORAL | Status: DC

## 2011-09-09 MED ORDER — PANTOPRAZOLE SODIUM 40 MG PO TBEC
40.0000 mg | DELAYED_RELEASE_TABLET | Freq: Every day | ORAL | Status: DC
  Administered 2011-09-09 – 2011-09-11 (×3): 40 mg via ORAL
  Filled 2011-09-09 (×3): qty 1

## 2011-09-09 MED ORDER — SIMVASTATIN 20 MG PO TABS
20.0000 mg | ORAL_TABLET | Freq: Every day | ORAL | Status: DC
  Administered 2011-09-09: 20 mg via ORAL
  Filled 2011-09-09 (×2): qty 1

## 2011-09-09 MED ORDER — SODIUM CHLORIDE 0.9 % IV SOLN
250.0000 mL | INTRAVENOUS | Status: DC | PRN
  Administered 2011-09-09: 250 mL via INTRAVENOUS

## 2011-09-09 MED ORDER — ALUM & MAG HYDROXIDE-SIMETH 200-200-20 MG/5ML PO SUSP: 30.0000 mL | Freq: Four times a day (QID) | ORAL | Status: DC | PRN

## 2011-09-09 MED ORDER — HEPARIN BOLUS VIA INFUSION
3000.0000 [IU] | Freq: Once | INTRAVENOUS | Status: AC
  Administered 2011-09-09: 3000 [IU] via INTRAVENOUS
  Filled 2011-09-09: qty 3000

## 2011-09-09 MED ORDER — ASPIRIN 325 MG PO TABS
325.0000 mg | ORAL_TABLET | Freq: Every day | ORAL | Status: AC
  Administered 2011-09-09 – 2011-09-10 (×2): 325 mg via ORAL
  Filled 2011-09-09 (×2): qty 1

## 2011-09-09 MED ORDER — PROPRANOLOL HCL 40 MG PO TABS
40.0000 mg | ORAL_TABLET | Freq: Two times a day (BID) | ORAL | Status: DC
Start: 2011-09-09 — End: 2011-09-11
  Administered 2011-09-09 – 2011-09-11 (×4): 40 mg via ORAL
  Filled 2011-09-09 (×6): qty 1

## 2011-09-09 MED ORDER — SODIUM CHLORIDE 0.9 % IJ SOLN: 3.0000 mL | INTRAMUSCULAR | Status: DC | PRN

## 2011-09-09 MED ORDER — VITAMIN D3 25 MCG (1000 UNIT) PO TABS
2000.0000 [IU] | ORAL_TABLET | Freq: Every day | ORAL | Status: DC
  Administered 2011-09-09 – 2011-09-11 (×3): 2000 [IU] via ORAL
  Filled 2011-09-09 (×3): qty 2

## 2011-09-09 MED ORDER — DILTIAZEM HCL ER COATED BEADS 120 MG PO CP24
120.0000 mg | ORAL_CAPSULE | Freq: Every day | ORAL | Status: DC
Start: 2011-09-09 — End: 2011-09-11
  Administered 2011-09-09 – 2011-09-11 (×3): 120 mg via ORAL
  Filled 2011-09-09 (×3): qty 1

## 2011-09-09 MED ORDER — SODIUM CHLORIDE 0.9 % IJ SOLN
3.0000 mL | Freq: Two times a day (BID) | INTRAMUSCULAR | Status: DC
  Administered 2011-09-09 – 2011-09-11 (×4): 3 mL via INTRAVENOUS

## 2011-09-09 NOTE — Consult Note (Signed)
Reason for Consult:Chest pain  Referring Physician: [name], Jordan is an 60 y.o. female.   HPI: 60 y/o female with a PMH of paroxysmal AFIB (on Propranolol and Diltiazem for rate contro-currently in sinus rhythml), MVP with mild mitral regurgitation who was admitted for chest pain that started at about 1 a.m. On 09/09/2011.  Pain is described as "hard" (could not elaborate any further) 7/10 in severity.  It lasted about 20 minutes before spontaneous relief (after chewing aspirin).  She continues to have intermittent chest pain on and off but of lower severity than the initial chest pain that lead to her admission. Her cardiac markers were position with CKMB 5.9 and troponin of 0.94. Prior TTE from 07/24/11 showed LVEF of 60-65%.  Past Medical History  Diagnosis Date  . MITRAL VALVE PROLAPSE   . MITRAL INSUFFICIENCY   . FIBRILLATION, ATRIAL   . CARDIOMYOPATHY   . Atrial septal aneurysm     Past Surgical History  Procedure Date  . Abdominal hysterectomy   . Cervical spine surgery     Family History  Problem Relation Age of Onset  . Coronary artery disease    . Thyroid disease    . Multiple sclerosis Sister   . Heart attack Mother     Social History:  reports that she has quit smoking. She does not have any smokeless tobacco history on file. She reports that she does not drink alcohol or use illicit drugs.  Allergies:  Allergies  Allergen Reactions  . Sulfonamide Derivatives Rash    Medications: Reviewed  Results for orders placed during the hospital encounter of 09/09/11 (from the past 48 hour(s))  CBC     Status: Abnormal   Collection Time   09/09/11  2:59 AM      Component Value Range Comment   WBC 5.6  4.0 - 10.5 (K/uL)    RBC 4.18  3.87 - 5.11 (MIL/uL)    Hemoglobin 11.8 (*) 12.0 - 15.0 (g/dL)    HCT 04.5 (*) 40.9 - 46.0 (%)    MCV 85.6  78.0 - 100.0 (fL)    MCH 28.2  26.0 - 34.0 (pg)    MCHC 33.0  30.0 - 36.0 (g/dL)    RDW 81.1  91.4 - 78.2 (%)     Platelets 190  150 - 400 (K/uL)   DIFFERENTIAL     Status: Abnormal   Collection Time   09/09/11  2:59 AM      Component Value Range Comment   Neutrophils Relative 63  43 - 77 (%)    Neutro Abs 3.5  1.7 - 7.7 (K/uL)    Lymphocytes Relative 22  12 - 46 (%)    Lymphs Abs 1.2  0.7 - 4.0 (K/uL)    Monocytes Relative 9  3 - 12 (%)    Monocytes Absolute 0.5  0.1 - 1.0 (K/uL)    Eosinophils Relative 6 (*) 0 - 5 (%)    Eosinophils Absolute 0.4  0.0 - 0.7 (K/uL)    Basophils Relative 0  0 - 1 (%)    Basophils Absolute 0.0  0.0 - 0.1 (K/uL)   COMPREHENSIVE METABOLIC PANEL     Status: Abnormal   Collection Time   09/09/11  2:59 AM      Component Value Range Comment   Sodium 139  135 - 145 (mEq/L)    Potassium 3.9  3.5 - 5.1 (mEq/L)    Chloride 104  96 - 112 (mEq/L)  CO2 27  19 - 32 (mEq/L)    Glucose, Bld 96  70 - 99 (mg/dL)    BUN 21  6 - 23 (mg/dL)    Creatinine, Ser 1.61  0.50 - 1.10 (mg/dL)    Calcium 09.6  8.4 - 10.5 (mg/dL)    Total Protein 6.7  6.0 - 8.3 (g/dL)    Albumin 3.4 (*) 3.5 - 5.2 (g/dL)    AST 23  0 - 37 (U/L)    ALT 18  0 - 35 (U/L)    Alkaline Phosphatase 102  39 - 117 (U/L)    Total Bilirubin 0.2 (*) 0.3 - 1.2 (mg/dL)    GFR calc non Af Amer 88 (*) >90 (mL/min)    GFR calc Af Amer >90  >90 (mL/min)   POCT I-STAT TROPONIN I     Status: Normal   Collection Time   09/09/11  3:19 AM      Component Value Range Comment   Troponin i, poc 0.00  0.00 - 0.08 (ng/mL)    Comment 3            URINALYSIS, ROUTINE W REFLEX MICROSCOPIC     Status: Abnormal   Collection Time   09/09/11  3:41 AM      Component Value Range Comment   Color, Urine YELLOW  YELLOW     APPearance CLEAR  CLEAR     Specific Gravity, Urine 1.005  1.005 - 1.030     pH 6.0  5.0 - 8.0     Glucose, UA NEGATIVE  NEGATIVE (mg/dL)    Hgb urine dipstick NEGATIVE  NEGATIVE     Bilirubin Urine NEGATIVE  NEGATIVE     Ketones, ur NEGATIVE  NEGATIVE (mg/dL)    Protein, ur NEGATIVE  NEGATIVE (mg/dL)     Urobilinogen, UA 0.2  0.0 - 1.0 (mg/dL)    Nitrite NEGATIVE  NEGATIVE     Leukocytes, UA TRACE (*) NEGATIVE    URINE MICROSCOPIC-ADD ON     Status: Normal   Collection Time   09/09/11  3:41 AM      Component Value Range Comment   Squamous Epithelial / LPF RARE  RARE     WBC, UA 0-2  <3 (WBC/hpf)    Bacteria, UA RARE  RARE    TSH     Status: Normal   Collection Time   09/09/11  8:45 AM      Component Value Range Comment   TSH 0.895  0.350 - 4.500 (uIU/mL)   CARDIAC PANEL(CRET KIN+CKTOT+MB+TROPI)     Status: Abnormal   Collection Time   09/09/11  3:24 PM      Component Value Range Comment   Total CK 70  7 - 177 (U/L)    CK, MB 5.9 (*) 0.3 - 4.0 (ng/mL)    Troponin I 0.94 (*) <0.30 (ng/mL)    Relative Index RELATIVE INDEX IS INVALID  0.0 - 2.5    LIPID PANEL     Status: Normal   Collection Time   09/09/11  4:46 PM      Component Value Range Comment   Cholesterol 156  0 - 200 (mg/dL)    Triglycerides 045  <150 (mg/dL)    HDL 63  >40 (mg/dL)    Total CHOL/HDL Ratio 2.5      VLDL 29  0 - 40 (mg/dL)    LDL Cholesterol 64  0 - 99 (mg/dL)     Dg Chest 2 View  98/07/9146  *  RADIOLOGY REPORT*  Clinical Data: Chest pain.  CHEST - 2 VIEW  Comparison: Chest radiograph performed 08/20/2005  Findings: The lungs are hyperexpanded, with flattening of the hemidiaphragms, compatible with COPD.  There is no evidence of focal opacification, pleural effusion or pneumothorax.  Bilateral nipple shadows are seen, more prominent on the right.  The heart is normal in size; the mediastinal contour is within normal limits.  No acute osseous abnormalities are seen. Cervical spinal fusion hardware is noted.  IMPRESSION: Findings of COPD; no acute cardiopulmonary process seen.  Original Report Authenticated By: Tonia Ghent, M.D.    Review of Systems  Constitutional: Negative.   HENT: Negative.   Eyes: Negative.   Respiratory: Negative.   Cardiovascular: Positive for chest pain. Negative for  palpitations, orthopnea, claudication, leg swelling and PND.  Gastrointestinal: Negative.   Genitourinary: Negative.   Musculoskeletal: Negative.   Skin: Negative.   Neurological: Negative.   Psychiatric/Behavioral: Negative.    Blood pressure 102/54, pulse 78, temperature 98.9 F (37.2 C), temperature source Oral, resp. rate 18, height 5\' 5"  (1.651 m), weight 57.9 kg (127 lb 10.3 oz), SpO2 98.00%. Physical Exam  Constitutional: She is oriented to person, place, and time. She appears well-developed and well-nourished.  HENT:  Head: Normocephalic.  Eyes: EOM are normal.  Neck: Normal range of motion.  Cardiovascular: Normal rate, regular rhythm, normal heart sounds and intact distal pulses.   Respiratory: Effort normal and breath sounds normal. No respiratory distress. She has no wheezes. She has no rales. She exhibits no tenderness.  GI: Soft. Bowel sounds are normal.  Musculoskeletal: Normal range of motion.  Neurological: She is alert and oriented to person, place, and time.  Skin: Skin is dry.   ECG: Sinus rhythm with no ST/T-wave changes.  Assessment:  1. NSTEMI 2. MVP with mild mitral regurgitation 3. History of paroxymal AFIB-currently in sinus rhythm  Plan:  1. Agree with Aspirin, but at 81 mg every Daily 2. Agree with heparin as you are already doing 3. Start Metoprolol at 25mg  b.i.d and d/c propranolol 4.  Wean Diltiazem as you are maximizing metoprolol 5. Continue to cycle cardiac markers 6. If recurrent chest pain, consider nitro paste to chest wall 7. Fasting lipids and LFTs in a.m. 8.  NPO past midnight for possible cardiac cath in a.m.  Emile Ringgenberg E 09/09/2011, 6:24 PM

## 2011-09-09 NOTE — ED Notes (Signed)
Pt states that she fell asleep in a chair, and awoke and then had chest pain

## 2011-09-09 NOTE — H&P (Signed)
PCP:   Raynelle Jan., MD   Chief Complaint: Chest pain   HPI: Kristina Jordan is an 60 y.o. female with history for mitral valve prolapse, atrial septal aneurysm, mitral regurg, cardiomyopathy, proximal atrial fibrillation, but no known coronary artery disease, presents to the emergency room with 2 episodes of substernal chest pain lasting 30 minutes each. She denied shortness of breath, nausea, diaphoresis, black stool, bloody stool, fever, chills, leg swelling, or calf tenderness. She has been active and denied any exertional chest pain. She still works  at [employer] and denied having any chest discomfort. She has no diabetes, hypertension, hyperlipidemia, or premature coronary disease in her family. Workup included negative cardiac markers, a normal EKG, and a chest x-ray only showed evidence of COPD. Hospitalists was asked to admit her for cardiac rule out.  Rewiew of Systems:  The patient denies anorexia, fever, weight loss,, vision loss, decreased hearing, hoarseness, chest pain, syncope, dyspnea on exertion, peripheral edema, balance deficits, hemoptysis, abdominal pain, melena, hematochezia, hematuria, incontinence, genital sores, muscle weakness, suspicious skin lesions, transient blindness, difficulty walking, depression, unusual weight change, abnormal bleeding, enlarged lymph nodes, angioedema, and breast masses.   Past Medical History  Diagnosis Date  . MITRAL VALVE PROLAPSE   . MITRAL INSUFFICIENCY   . FIBRILLATION, ATRIAL   . CARDIOMYOPATHY   . Atrial septal aneurysm     Past Surgical History  Procedure Date  . Abdominal hysterectomy   . Cervical spine surgery     Medications:  HOME MEDS: Prior to Admission medications   Medication Sig Start Date End Date Taking? Authorizing Provider  aspirin 325 MG tablet Take 325 mg by mouth daily.     Yes Historical Provider, MD  calcitonin, salmon, (MIACALCIN/FORTICAL) 200 UNIT/ACT nasal spray Place 1 spray into the nose  daily.  04/28/11  Yes Historical Provider, MD  Cholecalciferol (VITAMIN D) 2000 UNITS CAPS Take 1 capsule by mouth daily.     Yes Historical Provider, MD  diltiazem (CARDIZEM CD) 120 MG 24 hr capsule Take 120 mg by mouth daily.  05/31/11  Yes Historical Provider, MD  Multiple Vitamins-Iron (STRESS B/IRON) TABS Take 1 tablet by mouth daily.    Yes Historical Provider, MD  propranolol (INDERAL) 40 MG tablet Take 40 mg by mouth 2 (two) times daily.     Yes Historical Provider, MD     Allergies:  Allergies  Allergen Reactions  . Sulfonamide Derivatives Rash    Social History:   reports that she has quit smoking. She does not have any smokeless tobacco history on file. She reports that she does not drink alcohol or use illicit drugs. 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  Family History: Family History  Problem Relation Age of Onset  . Coronary artery disease    . Thyroid disease     her mother had a massive MI at age 34.   Physical Exam: Filed Vitals:   09/09/11 0500 09/09/11 0515 09/09/11 0530 09/09/11 0545  BP: 101/56 103/54 103/54 105/55  Pulse: 65 65 73 69  Temp:      TempSrc:      Resp: 18 13 17 17   Height:      Weight:      SpO2: 97% 96% 98% 98%   Blood pressure 105/55, pulse 69, temperature 97.7 F (36.5 C), temperature source Oral, resp. rate 17, height 5\' 5"  (1.651 m), weight 58.968 kg (130 lb), SpO2 98.00%.  GEN:  Pleasant  person lying in the  stretcher in no acute distress; cooperative with exam PSYCH:  alert and oriented x4; does not appear anxious does not appear depressed; affect is normal HEENT: Mucous membranes pink and anicteric; PERRLA; EOM intact; no cervical lymphadenopathy nor thyromegaly or carotid bruit; no JVD; Breasts:: Not examined CHEST WALL: No tenderness CHEST: Normal respiration, there are scattered rhonchi with decreased breath sounds throughout. HEART: Regular rate and rhythm; no murmurs rubs or gallops BACK: No kyphosis or  scoliosis; no CVA tenderness ABDOMEN: Obese, soft non-tender; no masses, no organomegaly, normal abdominal bowel sounds; no pannus; no intertriginous candida. Rectal Exam: Not done EXTREMITIES: No bone or joint deformity; age-appropriate arthropathy of the hands and knees; no edema; no ulcerations. Genitalia: not examined PULSES: 2+ and symmetric SKIN: Normal hydration no rash or ulceration CNS: Cranial nerves 2-12 grossly intact no focal neurologic deficit   Labs & Imaging Results for orders placed during the hospital encounter of 09/09/11 (from the past 48 hour(s))  CBC     Status: Abnormal   Collection Time   09/09/11  2:59 AM      Component Value Range Comment   WBC 5.6  4.0 - 10.5 (K/uL)    RBC 4.18  3.87 - 5.11 (MIL/uL)    Hemoglobin 11.8 (*) 12.0 - 15.0 (g/dL)    HCT 16.1 (*) 09.6 - 46.0 (%)    MCV 85.6  78.0 - 100.0 (fL)    MCH 28.2  26.0 - 34.0 (pg)    MCHC 33.0  30.0 - 36.0 (g/dL)    RDW 04.5  40.9 - 81.1 (%)    Platelets 190  150 - 400 (K/uL)   DIFFERENTIAL     Status: Abnormal   Collection Time   09/09/11  2:59 AM      Component Value Range Comment   Neutrophils Relative 63  43 - 77 (%)    Neutro Abs 3.5  1.7 - 7.7 (K/uL)    Lymphocytes Relative 22  12 - 46 (%)    Lymphs Abs 1.2  0.7 - 4.0 (K/uL)    Monocytes Relative 9  3 - 12 (%)    Monocytes Absolute 0.5  0.1 - 1.0 (K/uL)    Eosinophils Relative 6 (*) 0 - 5 (%)    Eosinophils Absolute 0.4  0.0 - 0.7 (K/uL)    Basophils Relative 0  0 - 1 (%)    Basophils Absolute 0.0  0.0 - 0.1 (K/uL)   COMPREHENSIVE METABOLIC PANEL     Status: Abnormal   Collection Time   09/09/11  2:59 AM      Component Value Range Comment   Sodium 139  135 - 145 (mEq/L)    Potassium 3.9  3.5 - 5.1 (mEq/L)    Chloride 104  96 - 112 (mEq/L)    CO2 27  19 - 32 (mEq/L)    Glucose, Bld 96  70 - 99 (mg/dL)    BUN 21  6 - 23 (mg/dL)    Creatinine, Ser 9.14  0.50 - 1.10 (mg/dL)    Calcium 78.2  8.4 - 10.5 (mg/dL)    Total Protein 6.7  6.0 -  8.3 (g/dL)    Albumin 3.4 (*) 3.5 - 5.2 (g/dL)    AST 23  0 - 37 (U/L)    ALT 18  0 - 35 (U/L)    Alkaline Phosphatase 102  39 - 117 (U/L)    Total Bilirubin 0.2 (*) 0.3 - 1.2 (mg/dL)    GFR calc non Af Denyse Dago  88 (*) >90 (mL/min)    GFR calc Af Amer >90  >90 (mL/min)   POCT I-STAT TROPONIN I     Status: Normal   Collection Time   09/09/11  3:19 AM      Component Value Range Comment   Troponin i, poc 0.00  0.00 - 0.08 (ng/mL)    Comment 3            URINALYSIS, ROUTINE W REFLEX MICROSCOPIC     Status: Abnormal   Collection Time   09/09/11  3:41 AM      Component Value Range Comment   Color, Urine YELLOW  YELLOW     APPearance CLEAR  CLEAR     Specific Gravity, Urine 1.005  1.005 - 1.030     pH 6.0  5.0 - 8.0     Glucose, UA NEGATIVE  NEGATIVE (mg/dL)    Hgb urine dipstick NEGATIVE  NEGATIVE     Bilirubin Urine NEGATIVE  NEGATIVE     Ketones, ur NEGATIVE  NEGATIVE (mg/dL)    Protein, ur NEGATIVE  NEGATIVE (mg/dL)    Urobilinogen, UA 0.2  0.0 - 1.0 (mg/dL)    Nitrite NEGATIVE  NEGATIVE     Leukocytes, UA TRACE (*) NEGATIVE    URINE MICROSCOPIC-ADD ON     Status: Normal   Collection Time   09/09/11  3:41 AM      Component Value Range Comment   Squamous Epithelial / LPF RARE  RARE     WBC, UA 0-2  <3 (WBC/hpf)    Bacteria, UA RARE  RARE     Dg Chest 2 View  09/09/2011  *RADIOLOGY REPORT*  Clinical Data: Chest pain.  CHEST - 2 VIEW  Comparison: Chest radiograph performed 08/20/2005  Findings: The lungs are hyperexpanded, with flattening of the hemidiaphragms, compatible with COPD.  There is no evidence of focal opacification, pleural effusion or pneumothorax.  Bilateral nipple shadows are seen, more prominent on the right.  The heart is normal in size; the mediastinal contour is within normal limits.  No acute osseous abnormalities are seen. Cervical spinal fusion hardware is noted.  IMPRESSION: Findings of COPD; no acute cardiopulmonary process seen.  Original Report Authenticated  By: Tonia Ghent, M.D.      Assessment Present on Admission:  .MITRAL VALVE PROLAPSE .Atypical chest pain   PLAN:  Patient presents with atypical chest pain and low cardiac risk factors admitted for cardiac rule out. I do not think that her chest pain is cardiac however. We'll continue all her medications including her beta blocker, aspirin, calcium channel blocker, and will add a PPI to her regiment. Other plans as per orders. Ffurther risk modifications discussed as well.    Roderic Lammert 09/09/2011, 6:28 AM

## 2011-09-09 NOTE — ED Notes (Signed)
MD at bedside. Dr. Dayna Ramus with patient

## 2011-09-09 NOTE — ED Provider Notes (Signed)
History     CSN: [id number]  Arrival date & time 09/09/11  0202     Chief Complaint  Patient presents with  . Chest Pain   HPI Pt was seen at 0255.  Per pt, c/o gradual onset and resolution of one episode of chest "pain" that began approx 0100 PTA.  Pt describes the CP as mid-sternal chest "heaviness."  States the discomfort woke her up from sleep.  Pt states she was sitting in a chair when it began.  Discomfort lasted approx 1/2 hour before resolving after taking ASA 325mg  PO.  Denies hx of same pain, no SOB/cough, no palpitations, no abd pain, no back pain, no N/V/D, no fevers.    Past Medical History  Diagnosis Date  . MITRAL VALVE PROLAPSE   . MITRAL INSUFFICIENCY   . FIBRILLATION, ATRIAL   . CARDIOMYOPATHY   . Atrial septal aneurysm     Past Surgical History  Procedure Date  . Abdominal hysterectomy   . Cervical spine surgery     Family History  Problem Relation Age of Onset  . Coronary artery disease    . Thyroid disease      History  Substance Use Topics  . Smoking status: Former Games developer  . Smokeless tobacco: Not on file  . Alcohol Use: No    Review of Systems ROS: Statement: All systems negative except as marked or noted in the HPI; Constitutional: Negative for fever and chills. ; ; Eyes: Negative for eye pain, redness and discharge. ; ; ENMT: Negative for ear pain, hoarseness, nasal congestion, sinus pressure and sore throat. ; ; Cardiovascular: +CP.  Negative for palpitations, diaphoresis, dyspnea and peripheral edema. ; ; Respiratory: Negative for cough, wheezing and stridor. ; ; Gastrointestinal: Negative for nausea, vomiting, diarrhea, abdominal pain, blood in stool, hematemesis, jaundice and rectal bleeding. . ; ; Genitourinary: Negative for dysuria, flank pain and hematuria. ; ; Musculoskeletal: Negative for back pain and neck pain. Negative for swelling and trauma.; ; Skin: Negative for pruritus, rash, abrasions, blisters, bruising and skin lesion.; ;  Neuro: Negative for headache, lightheadedness and neck stiffness. Negative for weakness, altered level of consciousness , altered mental status, extremity weakness, paresthesias, involuntary movement, seizure and syncope.     Allergies  Sulfonamide derivatives  Home Medications   Current Outpatient Rx  Name Route Sig Dispense Refill  . ASPIRIN 325 MG PO TABS Oral Take 325 mg by mouth daily.      Marland Kitchen CALCITONIN (SALMON) 200 UNIT/ACT NA SOLN Nasal Place 1 spray into the nose daily.     Marland Kitchen VITAMIN D 2000 UNITS PO CAPS Oral Take 1 capsule by mouth daily.      Marland Kitchen DILTIAZEM HCL ER COATED BEADS 120 MG PO CP24 Oral Take 120 mg by mouth daily.     . STRESS B/IRON PO TABS Oral Take 1 tablet by mouth daily.     Marland Kitchen PROPRANOLOL HCL 40 MG PO TABS Oral Take 40 mg by mouth 2 (two) times daily.        BP 105/55  Pulse 69  Temp(Src) 97.7 F (36.5 C) (Oral)  Resp 17  Ht 5\' 5"  (1.651 m)  Wt 130 lb (58.968 kg)  BMI 21.63 kg/m2  SpO2 98%  Physical Exam 0300: Physical examination:  Nursing notes reviewed; Vital signs and O2 SAT reviewed;  Constitutional: Well developed, Well nourished, Well hydrated, In no acute distress; Head:  Normocephalic, atraumatic; Eyes: EOMI, PERRL, No scleral icterus; ENMT: Mouth and pharynx normal, Mucous membranes  moist; Neck: Supple, Full range of motion, No lymphadenopathy; Cardiovascular: Regular rate and rhythm, No murmur, rub, or gallop; Respiratory: Breath sounds clear & equal bilaterally, No rales, rhonchi, wheezes, or rub, Normal respiratory effort/excursion; Chest: Nontender, Movement normal; Abdomen: Soft, Nontender, Nondistended, Normal bowel sounds;  Extremities: Pulses normal, No tenderness, No edema, No calf edema or asymmetry.; Neuro: AA&Ox3, Major CN grossly intact. Speech clear, no facial droop.  No gross focal motor or sensory deficits in extremities.; Skin: Color normal, Warm, Dry, no rash.    ED Course  Procedures   MDM  MDM Reviewed: nursing note, vitals and  previous chart Reviewed previous: ECG Interpretation: ECG, labs and x-ray    Date: 09/09/2011  Rate: 60  Rhythm: normal sinus rhythm  QRS Axis: normal  Intervals: normal  ST/T Wave abnormalities: normal  Conduction Disutrbances:none  Narrative Interpretation:   Old EKG Reviewed: unchanged; no significant changes from previous EKG dated 08/22/2005.  Results for orders placed during the hospital encounter of 09/09/11  CBC      Component Value Range   WBC 5.6  4.0 - 10.5 (K/uL)   RBC 4.18  3.87 - 5.11 (MIL/uL)   Hemoglobin 11.8 (*) 12.0 - 15.0 (g/dL)   HCT 16.1 (*) 09.6 - 46.0 (%)   MCV 85.6  78.0 - 100.0 (fL)   MCH 28.2  26.0 - 34.0 (pg)   MCHC 33.0  30.0 - 36.0 (g/dL)   RDW 04.5  40.9 - 81.1 (%)   Platelets 190  150 - 400 (K/uL)  DIFFERENTIAL      Component Value Range   Neutrophils Relative 63  43 - 77 (%)   Neutro Abs 3.5  1.7 - 7.7 (K/uL)   Lymphocytes Relative 22  12 - 46 (%)   Lymphs Abs 1.2  0.7 - 4.0 (K/uL)   Monocytes Relative 9  3 - 12 (%)   Monocytes Absolute 0.5  0.1 - 1.0 (K/uL)   Eosinophils Relative 6 (*) 0 - 5 (%)   Eosinophils Absolute 0.4  0.0 - 0.7 (K/uL)   Basophils Relative 0  0 - 1 (%)   Basophils Absolute 0.0  0.0 - 0.1 (K/uL)  COMPREHENSIVE METABOLIC PANEL      Component Value Range   Sodium 139  135 - 145 (mEq/L)   Potassium 3.9  3.5 - 5.1 (mEq/L)   Chloride 104  96 - 112 (mEq/L)   CO2 27  19 - 32 (mEq/L)   Glucose, Bld 96  70 - 99 (mg/dL)   BUN 21  6 - 23 (mg/dL)   Creatinine, Ser 9.14  0.50 - 1.10 (mg/dL)   Calcium 78.2  8.4 - 10.5 (mg/dL)   Total Protein 6.7  6.0 - 8.3 (g/dL)   Albumin 3.4 (*) 3.5 - 5.2 (g/dL)   AST 23  0 - 37 (U/L)   ALT 18  0 - 35 (U/L)   Alkaline Phosphatase 102  39 - 117 (U/L)   Total Bilirubin 0.2 (*) 0.3 - 1.2 (mg/dL)   GFR calc non Af Amer 88 (*) >90 (mL/min)   GFR calc Af Amer >90  >90 (mL/min)  URINALYSIS, ROUTINE W REFLEX MICROSCOPIC      Component Value Range   Color, Urine YELLOW  YELLOW    APPearance  CLEAR  CLEAR    Specific Gravity, Urine 1.005  1.005 - 1.030    pH 6.0  5.0 - 8.0    Glucose, UA NEGATIVE  NEGATIVE (mg/dL)   Hgb urine dipstick NEGATIVE  NEGATIVE    Bilirubin Urine NEGATIVE  NEGATIVE    Ketones, ur NEGATIVE  NEGATIVE (mg/dL)   Protein, ur NEGATIVE  NEGATIVE (mg/dL)   Urobilinogen, UA 0.2  0.0 - 1.0 (mg/dL)   Nitrite NEGATIVE  NEGATIVE    Leukocytes, UA TRACE (*) NEGATIVE   POCT I-STAT TROPONIN I      Component Value Range   Troponin i, poc 0.00  0.00 - 0.08 (ng/mL)   Comment 3           URINE MICROSCOPIC-ADD ON      Component Value Range   Squamous Epithelial / LPF RARE  RARE    WBC, UA 0-2  <3 (WBC/hpf)   Bacteria, UA RARE  RARE     Dg Chest 2 View 09/09/2011  *RADIOLOGY REPORT*  Clinical Data: Chest pain.  CHEST - 2 VIEW  Comparison: Chest radiograph performed 08/20/2005  Findings: The lungs are hyperexpanded, with flattening of the hemidiaphragms, compatible with COPD.  There is no evidence of focal opacification, pleural effusion or pneumothorax.  Bilateral nipple shadows are seen, more prominent on the right.  The heart is normal in size; the mediastinal contour is within normal limits.  No acute osseous abnormalities are seen. Cervical spinal fusion hardware is noted.  IMPRESSION: Findings of COPD; no acute cardiopulmonary process seen.  Original Report Authenticated By: Tonia Ghent, M.D.     Eugénie.Cooks:  Pt states she "feels fine now" but endorses she had chest discomfort "a while ago" while in the ED but did not tell ED staff.  Now vague regarding when she had the chest discomfort and now long it lasted, as well as its description.  Dx testing d/w pt and family.  Questions answered.  Verb understanding, agreeable to admit.  T/C to [facility] Dr. Conley Rolls, case discussed, including:  HPI, pertinent PM/SHx, VS/PE, dx testing, ED course and treatment.  Agreeable to admit.  Requests to obtain tele bed to team 1/Dr. Sunnie Nielsen.     Haisley Arens Allison Quarry,  DO 09/10/11 1940

## 2011-09-09 NOTE — Progress Notes (Signed)
ANTICOAGULATION CONSULT NOTE - Initial Consult  Pharmacy Consult for Heparin Indication: CP, r/o ACS  Admit Complaint: CP  Pharmacist System-Based Medication Review: Anticoagulation: To start heparin IV for CP, r/o ACS. No Hx bleeding per H&P, CBC ok.  Infectious Disease: afeb, WBC wnl. Cardiovascular: Hx mitral valve prolapse, atrial septal aneurysm, mitral regurg, cardiomyopathy, proximal AFib, but no known CAD. EKG normal. CE x3 q8 hours. TSH wnl. Home meds cardizem, asa 325, propranolol have been continued. Statin added. VSS. Endocrinology: random CBG 96. No Hx DM. Gastrointestinal / Nutrition: PO PPI added Neurology: A&O, no current CP, per MD note. Nephrology: SCr 0.77, CLcr >60, lytes wnl Pulmonary: CXR showed evidence of COPD, pt says she quit smoking. Currently on RA, 98% O2 sat. Hematology / Oncology: see anticoag. PTA Medication Issues: none identified. Best Practices: PO PPI, home meds reviewed, IV heparin  Goal of Therapy:  Heparin level 0.3-0.7 units/ml   Plan:  1. Heparin IV bolus 3000 units x1 2. Heparin continuous IV infusion 700 units/hr (=7 mL/hr) 3. Will check a 6 hour heparin level 4. Daily CBC and heparin level 5. Will monitor for bleeding  Kristina Jordan 09/09/2011,5:13 PM  Medications:  Prescriptions prior to admission  Medication Sig Dispense Refill  . aspirin 325 MG tablet Take 325 mg by mouth daily.        . calcitonin, salmon, (MIACALCIN/FORTICAL) 200 UNIT/ACT nasal spray Place 1 spray into the nose daily.       . Cholecalciferol (VITAMIN D) 2000 UNITS CAPS Take 1 capsule by mouth daily.        Marland Kitchen diltiazem (CARDIZEM CD) 120 MG 24 hr capsule Take 120 mg by mouth daily.       . Multiple Vitamins-Iron (STRESS B/IRON) TABS Take 1 tablet by mouth daily.       . propranolol (INDERAL) 40 MG tablet Take 40 mg by mouth 2 (two) times daily.           Allergies  Allergen Reactions  . Sulfonamide Derivatives Rash    Patient Measurements: Height: 5\' 5"   (165.1 cm) Weight: 127 lb 10.3 oz (57.9 kg) IBW/kg (Calculated) : 57   Vital Signs: Temp: 98.9 F (37.2 C) (12/25 1404) Temp src: Oral (12/25 1404) BP: 102/54 mmHg (12/25 1404) Pulse Rate: 78  (12/25 1404)  Labs:  Basename 09/09/11 1524 09/09/11 0259  HGB -- 11.8*  HCT -- 35.8*  PLT -- 190  APTT -- --  LABPROT -- --  INR -- --  HEPARINUNFRC -- --  CREATININE -- 0.77  CKTOTAL 70 --  CKMB 5.9* --  TROPONINI 0.94* --   Estimated Creatinine Clearance: 65.6 ml/min (by C-G formula based on Cr of 0.77).  Medical History: Past Medical History  Diagnosis Date  . MITRAL VALVE PROLAPSE   . MITRAL INSUFFICIENCY   . FIBRILLATION, ATRIAL   . CARDIOMYOPATHY   . Atrial septal aneurysm

## 2011-09-09 NOTE — Progress Notes (Signed)
Patient seen and examined. Patient denies chest pain at this time. He had one episode that lasted for 15-20 minutes. She relates that she had some strange sensation on her heart. She denies shortness of breath or chest pain on exertion. I review labs. We'll continue with cardiac enzymes x3 every 8 hours. TSH already ordered. Continue with Cardizem and a beta blocker. Continue with aspirin. Patient might be able to be discharge 1226. Cardiac enzymes negative with close followup with her cardiologist.

## 2011-09-10 ENCOUNTER — Encounter (HOSPITAL_COMMUNITY)
Admission: EM | Disposition: A | Discharge status: Home / Self Care | Payer: Self-pay | Source: Home / Self Care | Attending: Internal Medicine

## 2011-09-10 ENCOUNTER — Encounter (HOSPITAL_COMMUNITY): Payer: Self-pay | Admitting: Cardiovascular Disease

## 2011-09-10 DIAGNOSIS — I214 Non-ST elevation (NSTEMI) myocardial infarction: Secondary | ICD-10-CM

## 2011-09-10 HISTORY — PX: LEFT HEART CATHETERIZATION WITH CORONARY ANGIOGRAM: SHX5451

## 2011-09-10 LAB — CBC
HCT: 39.1 % (ref 36.0–46.0)
MCH: 27.5 pg (ref 26.0–34.0)
MCHC: 32 g/dL (ref 30.0–36.0)
MCV: 85.9 fL (ref 78.0–100.0)
Platelets: 199 10*3/uL (ref 150–400)
RDW: 13.7 % (ref 11.5–15.5)

## 2011-09-10 LAB — CARDIAC PANEL(CRET KIN+CKTOT+MB+TROPI)
CK, MB: 4 ng/mL (ref 0.3–4.0)
Relative Index: INVALID (ref 0.0–2.5)
Total CK: 65 U/L (ref 7–177)

## 2011-09-10 LAB — HEPARIN LEVEL (UNFRACTIONATED): Heparin Unfractionated: 0.31 IU/mL (ref 0.30–0.70)

## 2011-09-10 LAB — POCT ACTIVATED CLOTTING TIME: Activated Clotting Time: 105 seconds

## 2011-09-10 SURGERY — LEFT HEART CATHETERIZATION WITH CORONARY ANGIOGRAM
Anesthesia: LOCAL

## 2011-09-10 MED ORDER — ASPIRIN EC 325 MG PO TBEC: 325.0000 mg | DELAYED_RELEASE_TABLET | Freq: Every day | ORAL | Status: DC

## 2011-09-10 MED ORDER — HEPARIN (PORCINE) IN NACL 2-0.9 UNIT/ML-% IJ SOLN
INTRAMUSCULAR | Status: AC
  Filled 2011-09-10: qty 2000

## 2011-09-10 MED ORDER — NITROGLYCERIN 0.2 MG/ML ON CALL CATH LAB
INTRAVENOUS | Status: AC
  Filled 2011-09-10: qty 1

## 2011-09-10 MED ORDER — ROSUVASTATIN CALCIUM 5 MG PO TABS
5.0000 mg | ORAL_TABLET | Freq: Every day | ORAL | Status: DC
  Administered 2011-09-10: 5 mg via ORAL
  Filled 2011-09-10 (×3): qty 1

## 2011-09-10 MED ORDER — SODIUM CHLORIDE 0.9 % IV SOLN
INTRAVENOUS | Status: AC
  Administered 2011-09-10: 19:00:00 via INTRAVENOUS

## 2011-09-10 MED ORDER — FENTANYL CITRATE 0.05 MG/ML IJ SOLN
INTRAMUSCULAR | Status: AC
  Filled 2011-09-10: qty 2

## 2011-09-10 MED ORDER — SODIUM CHLORIDE 0.9 % IV SOLN: INTRAVENOUS | Status: DC

## 2011-09-10 MED ORDER — ASPIRIN 81 MG PO CHEW: 324.0000 mg | CHEWABLE_TABLET | ORAL | Status: DC

## 2011-09-10 MED ORDER — LIDOCAINE HCL (PF) 1 % IJ SOLN
INTRAMUSCULAR | Status: AC
  Filled 2011-09-10: qty 30

## 2011-09-10 MED ORDER — MIDAZOLAM HCL 2 MG/2ML IJ SOLN
INTRAMUSCULAR | Status: AC
  Filled 2011-09-10: qty 2

## 2011-09-10 MED ORDER — SODIUM CHLORIDE 0.9 % IV SOLN: 1.0000 mL/kg/h | INTRAVENOUS | Status: DC

## 2011-09-10 NOTE — Progress Notes (Signed)
Subjective: Kristina Jordan denies chest pain, shortness of breath. She has not had anymore episode of chest pain. Objective: Filed Vitals:   09/09/11 1033 09/09/11 1404 09/09/11 2202 09/10/11 0430  BP: 102/57 102/54 110/58 114/58  Pulse:  78 79 92  Temp:  98.9 F (37.2 C) 97.5 F (36.4 C) 97.4 F (36.3 C)  TempSrc:  Oral Oral Oral  Resp:  18 18 18   Height:      Weight:    57.1 kg (125 lb 14.1 oz)  SpO2:  98% 99% 95%   Weight change: -1.068 kg (-2 lb 5.7 oz)  Intake/Output Summary (Last 24 hours) at 09/10/11 1212 Last data filed at 09/10/11 1151  Gross per 24 hour  Intake 909.76 ml  Output   2875 ml  Net -1965.24 ml    General: Alert, awake, oriented x3, in no acute distress.  HEENT: No bruits, no goiter.  Heart: Regular rate and rhythm, without murmurs, rubs, gallops.  Lungs: Clear to auscultation, bilateral air movement.  Abdomen: Soft, nontender, nondistended, positive bowel sounds.  Neuro: Grossly intact, nonfocal. Extremities no edema.   Lab Results:  [facility] 09/09/11 0259  NA 139  K 3.9  CL 104  CO2 27  GLUCOSE 96  BUN 21  CREATININE 0.77  CALCIUM 10.0  MG --  PHOS --    Basename 09/09/11 0259  AST 23  ALT 18  ALKPHOS 102  BILITOT 0.2*  PROT 6.7  ALBUMIN 3.4*    Basename 09/10/11 0600 09/09/11 0259  WBC 5.8 5.6  NEUTROABS -- 3.5  HGB 12.5 11.8*  HCT 39.1 35.8*  MCV 85.9 85.6  PLT 199 190    Basename 09/10/11 0910 09/09/11 2355 09/09/11 1524  CKTOTAL 56 65 70  CKMB 4.0 4.9* 5.9*  CKMBINDEX -- -- --  TROPONINI 0.53* 0.88* 0.94*    Basename 09/09/11 1646  CHOL 156  HDL 63  LDLCALC 64  TRIG 144  CHOLHDL 2.5  LDLDIRECT --    Basename 09/09/11 0845  TSH 0.895  T4TOTAL --  T3FREE --  THYROIDAB --   Studies/Results: Dg Chest 2 View  09/09/2011  *RADIOLOGY REPORT*  Clinical Data: Chest pain.  CHEST - 2 VIEW  Comparison: Chest radiograph performed 08/20/2005  Findings: The lungs are hyperexpanded, with flattening of the  hemidiaphragms, compatible with COPD.  There is no evidence of focal opacification, pleural effusion or pneumothorax.  Bilateral nipple shadows are seen, more prominent on the right.  The heart is normal in size; the mediastinal contour is within normal limits.  No acute osseous abnormalities are seen. Cervical spinal fusion hardware is noted.  IMPRESSION: Findings of COPD; no acute cardiopulmonary process seen.  Original Report Authenticated By: Tonia Ghent, M.D.    Medications: I have reviewed the patient's current medications.   Patient Active Hospital Problem List:  History A fib:  Continue with Cardizem. Will defer  to cardiology wean Diltiazem as recommended by  Initial cardiology consult note.  Metoprolol. MITRAL VALVE PROLAPSE (12/24/2009)  Propranolol change it to metoprolol by cardiology.   Non-ST elevation MI (NSTEMI) (09/09/2011)  Chest pain-free, continue with metoprolol, aspirin, IV heparin. Nitroglycerin  when necessary. For cardiac cath today.      LOS: 1 day   [name] M.D.  [facility] 09/10/2011, 12:12 PM

## 2011-09-10 NOTE — H&P (View-Only) (Signed)
SUBJECTIVE:  Came in with chest pain.  None at present.  Denies SOB  PHYSICAL EXAM Filed Vitals:   09/09/11 1033 09/09/11 1404 09/09/11 2202 09/10/11 0430  BP: 102/57 102/54 110/58 114/58  Pulse:  78 79 92  Temp:  98.9 F (37.2 C) 97.5 F (36.4 C) 97.4 F (36.3 C)  TempSrc:  Oral Oral Oral  Resp:  18 18 18   Height:      Weight:    125 lb 14.1 oz (57.1 kg)  SpO2:  98% 99% 95%   General:  No distress Lungs:  Clear Heart:  RRR, no rub Abdomen:  Positive bowel sounds, no rebound no guarding Extremities:  No edema.  LABS: Lab Results  Component Value Date   CKTOTAL 65 09/09/2011   CKMB 4.9* 09/09/2011   TROPONINI 0.88* 09/09/2011   Results for orders placed during the hospital encounter of 09/09/11 (from the past 24 hour(s))  TSH     Status: Normal   Collection Time   09/09/11  8:45 AM      Component Value Range   TSH 0.895  0.350 - 4.500 (uIU/mL)  CARDIAC PANEL(CRET KIN+CKTOT+MB+TROPI)     Status: Abnormal   Collection Time   09/09/11  3:24 PM      Component Value Range   Total CK 70  7 - 177 (U/L)   CK, MB 5.9 (*) 0.3 - 4.0 (ng/mL)   Troponin I 0.94 (*) <0.30 (ng/mL)   Relative Index RELATIVE INDEX IS INVALID  0.0 - 2.5   LIPID PANEL     Status: Normal   Collection Time   09/09/11  4:46 PM      Component Value Range   Cholesterol 156  0 - 200 (mg/dL)   Triglycerides 161  <096 (mg/dL)   HDL 63  >04 (mg/dL)   Total CHOL/HDL Ratio 2.5     VLDL 29  0 - 40 (mg/dL)   LDL Cholesterol 64  0 - 99 (mg/dL)  CARDIAC PANEL(CRET KIN+CKTOT+MB+TROPI)     Status: Abnormal   Collection Time   09/09/11 11:55 PM      Component Value Range   Total CK 65  7 - 177 (U/L)   CK, MB 4.9 (*) 0.3 - 4.0 (ng/mL)   Troponin I 0.88 (*) <0.30 (ng/mL)   Relative Index RELATIVE INDEX IS INVALID  0.0 - 2.5   HEPARIN LEVEL (UNFRACTIONATED)     Status: Normal   Collection Time   09/09/11 11:56 PM      Component Value Range   Heparin Unfractionated 0.31  0.30 - 0.70 (IU/mL)  CBC      Status: Normal   Collection Time   09/10/11  6:00 AM      Component Value Range   WBC 5.8  4.0 - 10.5 (K/uL)   RBC 4.55  3.87 - 5.11 (MIL/uL)   Hemoglobin 12.5  12.0 - 15.0 (g/dL)   HCT 54.0  98.1 - 19.1 (%)   MCV 85.9  78.0 - 100.0 (fL)   MCH 27.5  26.0 - 34.0 (pg)   MCHC 32.0  30.0 - 36.0 (g/dL)   RDW 47.8  29.5 - 62.1 (%)   Platelets 199  150 - 400 (K/uL)    Intake/Output Summary (Last 24 hours) at 09/10/11 0810 Last data filed at 09/10/11 0700  Gross per 24 hour  Intake 1433.68 ml  Output   2225 ml  Net -791.32 ml    ASSESSMENT AND PLAN:    1)  Non-ST elevation MI (NSTEMI):  No EKG yet this am.  Trop slightly elevated.  She has had a distant cath without CAD.  However, now with chest pressure.  Needs repeat cath.  I will schedule for right femoral cath.  (Very small wrists).   2)  MITRAL VALVE PROLAPSE:  Echo in Nov.  I clarified that she does not need SBE prophylaxis.  Note:  EF is better than is has been in the past.  Rollene Rotunda 09/10/2011 8:10 AM

## 2011-09-10 NOTE — Progress Notes (Signed)
 SUBJECTIVE:  Came in with chest pain.  None at present.  Denies SOB  PHYSICAL EXAM Filed Vitals:   09/09/11 1033 09/09/11 1404 09/09/11 2202 09/10/11 0430  BP: 102/57 102/54 110/58 114/58  Pulse:  78 79 92  Temp:  98.9 F (37.2 C) 97.5 F (36.4 C) 97.4 F (36.3 C)  TempSrc:  Oral Oral Oral  Resp:  18 18 18  Height:      Weight:    125 lb 14.1 oz (57.1 kg)  SpO2:  98% 99% 95%   General:  No distress Lungs:  Clear Heart:  RRR, no rub Abdomen:  Positive bowel sounds, no rebound no guarding Extremities:  No edema.  LABS: Lab Results  Component Value Date   CKTOTAL 65 09/09/2011   CKMB 4.9* 09/09/2011   TROPONINI 0.88* 09/09/2011   Results for orders placed during the hospital encounter of 09/09/11 (from the past 24 hour(s))  TSH     Status: Normal   Collection Time   09/09/11  8:45 AM      Component Value Range   TSH 0.895  0.350 - 4.500 (uIU/mL)  CARDIAC PANEL(CRET KIN+CKTOT+MB+TROPI)     Status: Abnormal   Collection Time   09/09/11  3:24 PM      Component Value Range   Total CK 70  7 - 177 (U/L)   CK, MB 5.9 (*) 0.3 - 4.0 (ng/mL)   Troponin I 0.94 (*) <0.30 (ng/mL)   Relative Index RELATIVE INDEX IS INVALID  0.0 - 2.5   LIPID PANEL     Status: Normal   Collection Time   09/09/11  4:46 PM      Component Value Range   Cholesterol 156  0 - 200 (mg/dL)   Triglycerides 144  <150 (mg/dL)   HDL 63  >39 (mg/dL)   Total CHOL/HDL Ratio 2.5     VLDL 29  0 - 40 (mg/dL)   LDL Cholesterol 64  0 - 99 (mg/dL)  CARDIAC PANEL(CRET KIN+CKTOT+MB+TROPI)     Status: Abnormal   Collection Time   09/09/11 11:55 PM      Component Value Range   Total CK 65  7 - 177 (U/L)   CK, MB 4.9 (*) 0.3 - 4.0 (ng/mL)   Troponin I 0.88 (*) <0.30 (ng/mL)   Relative Index RELATIVE INDEX IS INVALID  0.0 - 2.5   HEPARIN LEVEL (UNFRACTIONATED)     Status: Normal   Collection Time   09/09/11 11:56 PM      Component Value Range   Heparin Unfractionated 0.31  0.30 - 0.70 (IU/mL)  CBC      Status: Normal   Collection Time   09/10/11  6:00 AM      Component Value Range   WBC 5.8  4.0 - 10.5 (K/uL)   RBC 4.55  3.87 - 5.11 (MIL/uL)   Hemoglobin 12.5  12.0 - 15.0 (g/dL)   HCT 39.1  36.0 - 46.0 (%)   MCV 85.9  78.0 - 100.0 (fL)   MCH 27.5  26.0 - 34.0 (pg)   MCHC 32.0  30.0 - 36.0 (g/dL)   RDW 13.7  11.5 - 15.5 (%)   Platelets 199  150 - 400 (K/uL)    Intake/Output Summary (Last 24 hours) at 09/10/11 0810 Last data filed at 09/10/11 0700  Gross per 24 hour  Intake 1433.68 ml  Output   2225 ml  Net -791.32 ml    ASSESSMENT AND PLAN:    1)    Non-ST elevation MI (NSTEMI):  No EKG yet this am.  Trop slightly elevated.  She has had a distant cath without CAD.  However, now with chest pressure.  Needs repeat cath.  I will schedule for right femoral cath.  (Very small wrists).   2)  MITRAL VALVE PROLAPSE:  Echo in Nov.  I clarified that she does not need SBE prophylaxis.  Note:  EF is better than is has been in the past.  Sven Pinheiro 09/10/2011 8:10 AM   

## 2011-09-10 NOTE — Interval H&P Note (Signed)
History and Physical Interval Note:  09/10/2011 2:46 PM  Kristina Jordan  has presented today for surgery, with the diagnosis of chest pain  The various methods of treatment have been discussed with the patient and family. After consideration of risks, benefits and other options for treatment, the patient has consented to  Procedure(s): LEFT HEART CATHETERIZATION WITH CORONARY ANGIOGRAM as a surgical intervention .  The patients' history has been reviewed, patient examined, no change in status, stable for surgery.  I have reviewed the patients' chart and labs.  Questions were answered to the patient's satisfaction.     Lorine Bears

## 2011-09-10 NOTE — Op Note (Signed)
Cardiac Catheterization Procedure Note  Name: Kristina Jordan MRN: [id number] DOB: Feb 18, 1951  Procedure: Left Heart Cath, Selective Coronary Angiography, LV angiography were  Indication: Non-ST elevation myocardial infarction.   Medications:  Sedation:  1 mg IV Versed, 25 mcg IV Fentanyl  Contrast:  70 Omnipaque  Procedural details: The right groin was prepped, draped, and anesthetized with 1% lidocaine. Using modified Seldinger technique, a 5 French sheath was introduced into the right femoral artery. Standard Judkins catheters were used for coronary angiography and left ventriculography. Catheter exchanges were performed over a guidewire. There were no immediate procedural complications. The patient was transferred to the post catheterization recovery area for further monitoring.   Procedural Findings:  Hemodynamics: AO:  129/57   mmHg LV:  133/8    mmHg LVEDP: 10  mmHg  Coronary angiography: Coronary dominance: Left   Left Main:  Normal in size but short. It's free of any significant disease.  Left Anterior Descending (LAD):  Normal in size with no significant atherosclerosis.  1st diagonal (D1):  Normal in size and free of significant disease.  2nd diagonal (D2):  Normal in size without significant disease.  3rd diagonal (D3):  Small in size.  Circumflex (LCx):  The vessel is large in size and dominant. It has no significant atherosclerosis or obstructive disease.  1st obtuse marginal:  Normal in size and free of significant disease.  2nd obtuse marginal:  Small in size and free of significant disease.  3rd obtuse marginal:  Small in size.   Right Coronary Artery: Small in size and nondominant. It has minor irregularities without significant disease. Left ventriculography: Left ventricular systolic function is low normal , LVEF is estimated at 50-55% %, there is no significant mitral regurgitation   Final Conclusions:   1. Normal coronary arteries. 2. Low normal LV  systolic function with an estimated ejection fraction of 50-55%. Normal filling pressures.  Recommendations: Medical therapy is recommended. No culprit was identified for non-ST elevation myocardial infarction.  Lorine Bears MD, [facility] 09/10/2011, 3:20 PM

## 2011-09-10 NOTE — Progress Notes (Signed)
ANTICOAGULATION CONSULT NOTE - Follow Up Consult  Pharmacy Consult for heparin Indication: chest pain/ACS  Allergies  Allergen Reactions  . Sulfonamide Derivatives Rash    Patient Measurements: Height: 5\' 5"  (165.1 cm) Weight: 127 lb 10.3 oz (57.9 kg) IBW/kg (Calculated) : 57   Vital Signs: Temp: 97.5 F (36.4 C) (12/25 2202) Temp src: Oral (12/25 2202) BP: 110/58 mmHg (12/25 2202) Pulse Rate: 79  (12/25 2202)  Labs:  Basename 09/09/11 2356 09/09/11 2355 09/09/11 1524 09/09/11 0259  HGB -- -- -- 11.8*  HCT -- -- -- 35.8*  PLT -- -- -- 190  APTT -- -- -- --  LABPROT -- -- -- --  INR -- -- -- --  HEPARINUNFRC 0.31 -- -- --  CREATININE -- -- -- 0.77  CKTOTAL -- 65 70 --  CKMB -- 4.9* 5.9* --  TROPONINI -- 0.88* 0.94* --   Estimated Creatinine Clearance: 65.6 ml/min (by C-G formula based on Cr of 0.77).   Medications:  Scheduled:    . aspirin  325 mg Oral Daily  . calcitonin (salmon)  1 spray Alternating Nares Daily  . cholecalciferol  2,000 Units Oral Daily  . diltiazem  120 mg Oral Daily  . heparin  3,000 Units Intravenous Once  . pantoprazole  40 mg Oral Q0600  . propranolol  40 mg Oral BID  . simvastatin  20 mg Oral q1800  . sodium chloride  3 mL Intravenous Q12H  . DISCONTD: Vitamin D  1 capsule Oral Daily   Infusions:    . heparin 700 Units/hr (09/09/11 1756)    Assessment: 60yo female therapeutic on heparin with initial dosing for CP though at very low end of goal.  Goal of Therapy:  Heparin level 0.3-0.7 units/ml   Plan:  Will increase heparin very slightly to 750mg /hr and check level with next CE.  Colleen Can PharmD BCPS 09/10/2011,1:09 AM

## 2011-09-10 NOTE — Progress Notes (Signed)
ANTICOAGULATION CONSULT NOTE - Initial Consult  Pharmacy Consult for Heparin Indication: CP, r/o ACS Admit Complaint: CP  Pharmacist System-Based Medication Review: Anticoagulation: IV heparin for ACS - NSTEMI per cards currently at goal (0.31) but at lower end, will increase slightly. Hx bleeding per H&P, CBC ok.  Infectious Disease: afeb, WBC wnl. Cardiovascular: Hx mitral valve prolapse, atrial septal aneurysm, mitral regurg, cardiomyopathy, proximal AFib, but no known CAD. EKG normal. CE x3 q8 hours - troponin elevated. CK, MB elevated.Marland Kitchen TSH wnl. Home meds cardizem, asa 325, propranolol, have been continued. zocor added. VSS. Plan for right femoral cath per cards. Endocrinology: glucose levels ok. No Hx DM. Gastrointestinal / Nutrition: PO PPI added Neurology: A&O Nephrology: CLcr >60, lytes stable Pulmonary: CXR showed evidence of COPD, pt says she quit smoking. Currently on RA, 95% O2 sat. Hematology / Oncology: CBC stable. PTA Medication Issues: none identified. Best Practices: PO PPI, home meds reviewed, IV heparin  Goal of Therapy:  Heparin level 0.3-0.7 units/ml   Plan:  1. Increase Heparin IV gtt to 800 units/hr 2. F/U AM heparin level 3. Will monitor for bleeding   Janace Litten, PharmD 09/10/11  10:45 am  Medications:  Prescriptions prior to admission  Medication Sig Dispense Refill  . aspirin 325 MG tablet Take 325 mg by mouth daily.        . calcitonin, salmon, (MIACALCIN/FORTICAL) 200 UNIT/ACT nasal spray Place 1 spray into the nose daily.       . Cholecalciferol (VITAMIN D) 2000 UNITS CAPS Take 1 capsule by mouth daily.        Marland Kitchen diltiazem (CARDIZEM CD) 120 MG 24 hr capsule Take 120 mg by mouth daily.       . Multiple Vitamins-Iron (STRESS B/IRON) TABS Take 1 tablet by mouth daily.       . propranolol (INDERAL) 40 MG tablet Take 40 mg by mouth 2 (two) times daily.           Allergies  Allergen Reactions  . Sulfonamide Derivatives Rash    Patient  Measurements: Height: 5\' 5"  (165.1 cm) Weight: 125 lb 14.1 oz (57.1 kg) (scale c) IBW/kg (Calculated) : 57   Vital Signs: Temp: 97.4 F (36.3 C) (12/26 0430) Temp src: Oral (12/26 0430) BP: 114/58 mmHg (12/26 0430) Pulse Rate: 92  (12/26 0430)  Labs:  Basename 09/10/11 0910 09/10/11 0600 09/09/11 2356 09/09/11 2355 09/09/11 1524 09/09/11 0259  HGB -- 12.5 -- -- -- 11.8*  HCT -- 39.1 -- -- -- 35.8*  PLT -- 199 -- -- -- 190  APTT -- -- -- -- -- --  LABPROT 13.1 -- -- -- -- --  INR 0.97 -- -- -- -- --  HEPARINUNFRC 0.31 -- 0.31 -- -- --  CREATININE -- -- -- -- -- 0.77  CKTOTAL 56 -- -- 65 70 --  CKMB 4.0 -- -- 4.9* 5.9* --  TROPONINI 0.53* -- -- 0.88* 0.94* --   Estimated Creatinine Clearance: 65.6 ml/min (by C-G formula based on Cr of 0.77).  Medical History: Past Medical History  Diagnosis Date  . MITRAL VALVE PROLAPSE   . MITRAL INSUFFICIENCY   . FIBRILLATION, ATRIAL   . CARDIOMYOPATHY   . Atrial septal aneurysm

## 2011-09-10 NOTE — Progress Notes (Signed)
Utilization review complete 

## 2011-09-10 NOTE — Progress Notes (Signed)
Heparin rate increase to 8ml per pharmacy order. Will continue to monitor.

## 2011-09-11 DIAGNOSIS — R079 Chest pain, unspecified: Secondary | ICD-10-CM

## 2011-09-11 LAB — URINE CULTURE: Colony Count: 15000

## 2011-09-11 LAB — CBC
HCT: 39.2 % (ref 36.0–46.0)
Hemoglobin: 12.5 g/dL (ref 12.0–15.0)
MCH: 27.4 pg (ref 26.0–34.0)
RBC: 4.57 MIL/uL (ref 3.87–5.11)

## 2011-09-11 MED ORDER — SIMVASTATIN 10 MG PO TABS: 10.0000 mg | ORAL_TABLET | Freq: Every day | ORAL | Status: DC

## 2011-09-11 NOTE — Progress Notes (Signed)
Patient discharged to home in stable condition with spouse, vital signs stable, no complaints, all questions answered and follow up care explained.

## 2011-09-11 NOTE — Progress Notes (Signed)
   SUBJECTIVE:  Palpitations but no chest pain last night  PHYSICAL EXAM Filed Vitals:   09/10/11 2100 09/10/11 2242 09/11/11 0522 09/11/11 0617  BP: 88/48 91/46 104/64 108/68  Pulse: 67  63 63  Temp: 97.9 F (36.6 C)  97.6 F (36.4 C)   TempSrc:   Oral   Resp: 20  18   Height:      Weight:   57.153 kg (126 lb)   SpO2: 97%  96%    General:  No distress Lungs:  Clear Heart:  RRR, no rub Abdomen:  Positive bowel sounds, no rebound no guarding Extremities:  Right groin without hematoma or echymosis LABS: Lab Results  Component Value Date   CKTOTAL 56 09/10/2011   CKMB 4.0 09/10/2011   TROPONINI 0.53* 09/10/2011   Results for orders placed during the hospital encounter of 09/09/11 (from the past 24 hour(s))  CARDIAC PANEL(CRET KIN+CKTOT+MB+TROPI)     Status: Abnormal   Collection Time   09/10/11  9:10 AM      Component Value Range   Total CK 56  7 - 177 (U/L)   CK, MB 4.0  0.3 - 4.0 (ng/mL)   Troponin I 0.53 (*) <0.30 (ng/mL)   Relative Index RELATIVE INDEX IS INVALID  0.0 - 2.5   HEPARIN LEVEL (UNFRACTIONATED)     Status: Normal   Collection Time   09/10/11  9:10 AM      Component Value Range   Heparin Unfractionated 0.31  0.30 - 0.70 (IU/mL)  PROTIME-INR     Status: Normal   Collection Time   09/10/11  9:10 AM      Component Value Range   Prothrombin Time 13.1  11.6 - 15.2 (seconds)   INR 0.97  0.00 - 1.49   POCT ACTIVATED CLOTTING TIME     Status: Normal   Collection Time   09/10/11  3:16 PM      Component Value Range   Activated Clotting Time 105    CBC     Status: Normal   Collection Time   09/11/11  6:22 AM      Component Value Range   WBC 5.6  4.0 - 10.5 (K/uL)   RBC 4.57  3.87 - 5.11 (MIL/uL)   Hemoglobin 12.5  12.0 - 15.0 (g/dL)   HCT 16.1  09.6 - 04.5 (%)   MCV 85.8  78.0 - 100.0 (fL)   MCH 27.4  26.0 - 34.0 (pg)   MCHC 31.9  30.0 - 36.0 (g/dL)   RDW 40.9  81.1 - 91.4 (%)   Platelets 191  150 - 400 (K/uL)    Intake/Output Summary (Last 24  hours) at 09/11/11 7829 Last data filed at 09/11/11 0615  Gross per 24 hour  Intake 1089.08 ml  Output   1625 ml  Net -535.92 ml    ASSESSMENT AND PLAN:    1)  Non-ST elevation MI (NSTEMI):  Cath with normal coronary arteries.  No further cardiac work up.     2)  MITRAL VALVE PROLAPSE:  Echo in Nov.  I clarified that she does not need SBE prophylaxis.  Note:  EF is better than is has been in the past.  No further work up.  Please call us with further questions.  I will have the office call to see her in about six months.  Fayrene Fearing [facility] 09/11/2011 7:26 AM

## 2011-09-11 NOTE — Progress Notes (Signed)
   CARE MANAGEMENT NOTE 09/11/2011  Patient:  Kristina Jordan, Kristina Jordan   Account Number:  [id number]  Date Initiated:  09/10/2011  Documentation initiated by:  Donn Pierini  Subjective/Objective Assessment:   Pt admitted with chest pain, cardiac enz. positive for NSTEMI     Action/Plan:   PTA pt lived at home with spouse, was independent with ADLs   Anticipated DC Date:  09/12/2011   Anticipated DC Plan:  HOME/SELF CARE      DC Planning Services  CM consult      Choice offered to / List presented to:             Status of service:  Completed, signed off Medicare Important Message given?   (If response is "NO", the following Medicare IM given date fields will be blank) Date Medicare IM given:   Date Additional Medicare IM given:    Discharge Disposition:  HOME/SELF CARE  Per UR Regulation:  Reviewed for med. necessity/level of care/duration of stay  Comments:  PCP- Chrismon  09/11/11- 1100- Donn Pierini RN, BSN [phone] Pt for discharge today, no further workup, no discharge needs  09/10/11- 1440- Donn Pierini RN, BSN [phone] Pt ruled in for NSTEMI, cards following, plan for cath. CM to follow.

## 2011-10-06 NOTE — Discharge Summary (Signed)
Patient ID: Kristina Jordan MRN: [id number] DOB/AGE: [birth year] 63 y.o. Primary Care Physician:CHRISMON,DENNIE E., MD, MD Admit date: 09/09/2011 Discharge date: 10/06/2011    Discharge Diagnoses:     MITRAL VALVE PROLAPSE  Non-ST elevation MI (NSTEMI) - with cardiac cath showing patent coronaries.    Medication List  As of 10/06/2011  8:55 AM   START taking these medications         simvastatin 10 MG tablet   Commonly known as: ZOCOR   Take 1 tablet (10 mg total) by mouth at bedtime.         CONTINUE taking these medications         aspirin 325 MG tablet      calcitonin (salmon) 200 UNIT/ACT nasal spray   Commonly known as: MIACALCIN/FORTICAL      diltiazem 120 MG 24 hr capsule   Commonly known as: CARDIZEM CD      propranolol 40 MG tablet   Commonly known as: INDERAL      Vitamin D 2000 UNITS Caps         STOP taking these medications         Stress B/Iron Tabs          Where to get your medications    These are the prescriptions that you need to pick up.   You may get these medications from any pharmacy.         simvastatin 10 MG tablet            Discharged Condition:good Coronary angiography:  Coronary dominance: Left  Left Main: Normal in size but short. It's free of any significant disease.  Left Anterior Descending (LAD): Normal in size with no significant atherosclerosis.  1st diagonal (D1): Normal in size and free of significant disease.  2nd diagonal (D2): Normal in size without significant disease.  3rd diagonal (D3): Small in size.  Circumflex (LCx): The vessel is large in size and dominant. It has no significant atherosclerosis or obstructive disease.  1st obtuse marginal: Normal in size and free of significant disease.  2nd obtuse marginal: Small in size and free of significant disease.  3rd obtuse marginal: Small in size.  Right Coronary Artery: Small in size and nondominant. It has minor irregularities without significant  disease. Left ventriculography: Left ventricular systolic function is low normal , LVEF is estimated at 50-55% %, there is no significant mitral regurgitation  Final Conclusions:  1. Normal coronary arteries.  2. Low normal LV systolic function with an estimated ejection fraction of 50-55%. Normal filling pressures.      Consults:[facility]  Significant Diagnostic Studies: Dg Chest 2 View  09/09/2011  *RADIOLOGY REPORT*  Clinical Data: Chest pain.  CHEST - 2 VIEW  Comparison: Chest radiograph performed 08/20/2005  Findings: The lungs are hyperexpanded, with flattening of the hemidiaphragms, compatible with COPD.  There is no evidence of focal opacification, pleural effusion or pneumothorax.  Bilateral nipple shadows are seen, more prominent on the right.  The heart is normal in size; the mediastinal contour is within normal limits.  No acute osseous abnormalities are seen. Cervical spinal fusion hardware is noted.  IMPRESSION: Findings of COPD; no acute cardiopulmonary process seen.  Original Report Authenticated By: Tonia Ghent, M.D.     Hospital Course: 61 yo woman with known mitral valve prolapse presents with chest pain, initial troponin elevated at 0.895. She was treated with iv  Heparin, aspirin and BB. She had a cardiac cath on 09/10/11 with normal coronaries.  Plan is to treat medically with BB, ASA and CCB.   Discharge Exam: Blood pressure 108/68, pulse 63, temperature 97.6 F (36.4 C), temperature source Oral, resp. rate 18, height 5\' 5"  (1.651 m), weight 57.153 kg (126 lb), SpO2 96.00%. Alert and oriented x3 CVS: RRR RS: CTAB  Disposition: home  Discharge Orders    Future Orders Please Complete By Expires   Diet - low sodium heart healthy      Increase activity slowly         Follow-up Information    Follow up with [facility] E[facility] MD. Make an appointment in 1 week.      Follow up with Rollene Rotunda, MD. (As needed)    Contact information:   [address] [place] [address] [phone]          Signed: Lonia Blood 10/06/2011, 8:55 AM

## 2012-02-23 ENCOUNTER — Ambulatory Visit: Payer: Self-pay | Admitting: Family Medicine

## 2012-07-29 ENCOUNTER — Ambulatory Visit (INDEPENDENT_AMBULATORY_CARE_PROVIDER_SITE_OTHER): Payer: BC Managed Care – PPO | Admitting: Cardiology

## 2012-07-29 ENCOUNTER — Encounter: Payer: Self-pay | Admitting: Cardiology

## 2012-07-29 VITALS — BP 104/65 | HR 65 | Ht 64.0 in | Wt 137.4 lb

## 2012-07-29 DIAGNOSIS — I341 Nonrheumatic mitral (valve) prolapse: Secondary | ICD-10-CM

## 2012-07-29 DIAGNOSIS — I059 Rheumatic mitral valve disease, unspecified: Secondary | ICD-10-CM

## 2012-07-29 NOTE — Patient Instructions (Signed)
Your physician has requested that you have an echocardiogram. Echocardiography is a painless test that uses sound waves to create images of your heart. It provides your doctor with information about the size and shape of your heart and how well your heart's chambers and valves are working. This procedure takes approximately one hour. There are no restrictions for this procedure.  Stop Zocor.

## 2012-07-29 NOTE — Progress Notes (Signed)
HPI The patient presents for followup after a recent non-Q-wave myocardial infarction. She had a cardiac catheterization and I reviewed this. It demonstrated normal coronary arteries. Her ejection fraction was low normal. There were no regional wall motion abnormalities.  Since this hospitalization the patient has had some rare palpitations. However, she has had nothing like the symptoms she had that night. She's not describing any presyncope or syncope. She's had no shortness of breath, PND or orthopnea. She's had no chest pressure, neck or arm discomfort. She's been active particularly at her job which requires a lot of walking.  Allergies  Allergen Reactions  . Sulfonamide Derivatives Rash    Current Outpatient Prescriptions  Medication Sig Dispense Refill  . aspirin 325 MG tablet Take 325 mg by mouth daily.        . calcitonin, salmon, (MIACALCIN/FORTICAL) 200 UNIT/ACT nasal spray Place 1 spray into the nose daily.       . Cholecalciferol (VITAMIN D) 2000 UNITS CAPS Take 1 capsule by mouth daily.        Marland Kitchen diltiazem (CARDIZEM CD) 120 MG 24 hr capsule Take 120 mg by mouth daily.       . propranolol (INDERAL) 40 MG tablet Take 40 mg by mouth 2 (two) times daily.        . simvastatin (ZOCOR) 10 MG tablet Take 1 tablet (10 mg total) by mouth at bedtime.  30 tablet  0    Past Medical History  Diagnosis Date  . MITRAL VALVE PROLAPSE   . MITRAL INSUFFICIENCY   . FIBRILLATION, ATRIAL   . CARDIOMYOPATHY   . Atrial septal aneurysm     Past Surgical History  Procedure Date  . Abdominal hysterectomy   . Cervical spine surgery     ROS:  As stated in the HPI and negative for all other systems.  PHYSICAL EXAM BP 104/65  Pulse 65  Ht 5\' 4"  (1.626 m)  Wt 137 lb 6.4 oz (62.324 kg)  BMI 23.58 kg/m2 GENERAL:  Well appearing HEENT:  Pupils equal round and reactive, fundi not visualized NECK:  No jugular venous distention, waveform within normal limits, carotid upstroke brisk and  symmetric, no bruits, no thyromegaly LUNGS:  Clear to auscultation bilaterally BACK:  No CVA tenderness CHEST:  Unremarkable HEART:  PMI not displaced or sustained,S1 and S2 within normal limits, no S3, no S4, no clicks, no rubs, no murmurs ABD:  Flat, positive bowel sounds normal in frequency in pitch, no bruits, no rebound, no guarding, no midline pulsatile mass, no hepatomegaly, no splenomegaly EXT:  2 plus pulses throughout, no edema, no cyanosis no clubbing NEURO:  Cranial nerves II through XII grossly intact, motor grossly intact throughout PSYCH:  Cognitively intact, oriented to person place and time  EKG:  Sinus rhythm, rate 65, axis within normal limits, intervals within normal limits, no acute ST-T wave changes.  07/29/2012  ASSESSMENT AND PLAN  NQWMI The patient had normal coronary arteries. No further workup is suggested. Of note she is interested in stopping her simvastatin given her normal coronary arteries I think this is reasonable.  MITRAL VALVE PROLAPSE -  I would like to follow this up with repeat echocardiography. Further treatment will be based on this result.  FIBRILLATION, ATRIAL - She has had rare palpitations since her hospitalization. There haven't been any documented episodes of fibrillation, although this could have been an undocumented episodes. Given the absence of ongoing symptoms no further evaluation is planned.  CARDIOMYOPATHY The ejection fraction be  assessed with the echo as above.

## 2012-08-04 ENCOUNTER — Ambulatory Visit (HOSPITAL_COMMUNITY): Payer: BC Managed Care – PPO | Attending: Cardiology | Admitting: Radiology

## 2012-08-04 DIAGNOSIS — I341 Nonrheumatic mitral (valve) prolapse: Secondary | ICD-10-CM

## 2012-08-04 DIAGNOSIS — I059 Rheumatic mitral valve disease, unspecified: Secondary | ICD-10-CM | POA: Insufficient documentation

## 2012-08-04 DIAGNOSIS — I252 Old myocardial infarction: Secondary | ICD-10-CM | POA: Insufficient documentation

## 2012-08-04 NOTE — Progress Notes (Signed)
Echocardiogram performed.  

## 2013-02-23 ENCOUNTER — Ambulatory Visit: Payer: Self-pay | Admitting: Family Medicine

## 2013-07-18 ENCOUNTER — Ambulatory Visit: Payer: BC Managed Care – PPO | Admitting: Cardiology

## 2013-08-16 ENCOUNTER — Ambulatory Visit (INDEPENDENT_AMBULATORY_CARE_PROVIDER_SITE_OTHER): Payer: BC Managed Care – PPO | Admitting: Cardiology

## 2013-08-16 ENCOUNTER — Encounter (INDEPENDENT_AMBULATORY_CARE_PROVIDER_SITE_OTHER): Payer: Self-pay

## 2013-08-16 ENCOUNTER — Encounter: Payer: Self-pay | Admitting: Cardiology

## 2013-08-16 VITALS — BP 110/64 | HR 65 | Ht 64.0 in | Wt 126.0 lb

## 2013-08-16 DIAGNOSIS — I4891 Unspecified atrial fibrillation: Secondary | ICD-10-CM

## 2013-08-16 NOTE — Progress Notes (Signed)
    HPI The patient presents for followup after a recent non-Q-wave myocardial infarction. She had a cardiac catheterization which demonstrated normal coronary arteries. Her ejection fraction was low normal. There were no regional wall motion abnormalities.  Since I last saw him he has done well.  The patient denies any new symptoms such as chest discomfort, neck or arm discomfort. There has been no new shortness of breath, PND or orthopnea. There has been nopresyncope or syncope.  She has rare nonsustained palpitations. She remains active at work.   Allergies  Allergen Reactions  . Sulfonamide Derivatives Rash    Current Outpatient Prescriptions  Medication Sig Dispense Refill  . aspirin 325 MG tablet Take 325 mg by mouth daily.        . benzonatate (TESSALON) 100 MG capsule Take by mouth 3 (three) times daily as needed for cough.      . calcitonin, salmon, (MIACALCIN/FORTICAL) 200 UNIT/ACT nasal spray Place 1 spray into the nose daily.       . Calcium Carb-Cholecalciferol (CALCIUM 600 + D PO) Take by mouth daily.      . Cholecalciferol (VITAMIN D) 2000 UNITS CAPS Take 1 capsule by mouth daily.        Marland Kitchen diltiazem (CARDIZEM CD) 120 MG 24 hr capsule Take 120 mg by mouth daily.       . propranolol (INDERAL) 40 MG tablet Take 40 mg by mouth 2 (two) times daily.         No current facility-administered medications for this visit.    Past Medical History  Diagnosis Date  . MITRAL VALVE PROLAPSE   . MITRAL INSUFFICIENCY   . FIBRILLATION, ATRIAL   . CARDIOMYOPATHY   . Atrial septal aneurysm     Past Surgical History  Procedure Laterality Date  . Abdominal hysterectomy    . Cervical spine surgery      ROS:  As stated in the HPI and negative for all other systems.  PHYSICAL EXAM BP 110/64  Pulse 65  Ht 5\' 4"  (1.626 m)  Wt 126 lb (57.153 kg)  BMI 21.62 kg/m2 GENERAL:  Well appearing NECK:  No jugular venous distention, waveform within normal limits, carotid upstroke brisk and  symmetric, no bruits, no thyromegaly LUNGS:  Clear to auscultation bilaterally CHEST:  Unremarkable HEART:  PMI not displaced or sustained,S1 and S2 within normal limits, no S3, no S4, positive clicks, no rubs, no murmurs ABD:  Flat, positive bowel sounds normal in frequency in pitch, no bruits, no rebound, no guarding, no midline pulsatile mass, no hepatomegaly, no splenomegaly EXT:  2 plus pulses throughout, no edema, no cyanosis no clubbing  EKG:  Sinus rhythm, rate 65, axis within normal limits, intervals within normal limits, no acute ST-T wave changes.  08/16/2013  ASSESSMENT AND PLAN  NQWMI The patient had normal coronary arteries. No further workup is suggested.   MITRAL VALVE PROLAPSE -  No further evaluation this year is suggested. 3 echocardiograms in a row demonstrated moderate prolapse without significant regurgitation. I will likely repeat an echo next year when I see her.  She has had no change to her physical exam.  CARDIOMYOPATHY Her EF was normal on the last echo.  No change in therapy is indicated.

## 2013-08-16 NOTE — Patient Instructions (Signed)
The current medical regimen is effective;  continue present plan and medications.  Follow up in 1 year with Dr Hochrein.  You will receive a letter in the mail 2 months before you are due.  Please call us when you receive this letter to schedule your follow up appointment.  

## 2014-03-06 ENCOUNTER — Ambulatory Visit: Payer: Self-pay | Admitting: Family Medicine

## 2014-08-15 ENCOUNTER — Encounter: Payer: Self-pay | Admitting: *Deleted

## 2014-08-21 ENCOUNTER — Encounter: Payer: Self-pay | Admitting: Cardiology

## 2014-08-21 ENCOUNTER — Ambulatory Visit (INDEPENDENT_AMBULATORY_CARE_PROVIDER_SITE_OTHER): Payer: Medicare HMO | Admitting: Cardiology

## 2014-08-21 VITALS — BP 105/62 | HR 65 | Ht 64.0 in | Wt 131.6 lb

## 2014-08-21 DIAGNOSIS — I482 Chronic atrial fibrillation, unspecified: Secondary | ICD-10-CM

## 2014-08-21 DIAGNOSIS — I059 Rheumatic mitral valve disease, unspecified: Secondary | ICD-10-CM

## 2014-08-21 NOTE — Patient Instructions (Signed)
Your physician recommends that you schedule a follow-up appointment in: 18 months with Dr. Percival Spanish  We are ordering an Echo for you to get done

## 2014-08-21 NOTE — Progress Notes (Signed)
    HPI The patient presents for followup after a non-Q-wave myocardial infarction in 2012.   A cardiac catheterization which demonstrated normal coronary arteries. Her ejection fraction was low normal. There were no regional wall motion abnormalities.  Since I last saw him he has done well.  The patient denies any new symptoms such as chest discomfort, neck or arm discomfort. There has been no new shortness of breath, PND or orthopnea. There has been nopresyncope or syncope.  She does not feel palpitations frequently.     Allergies  Allergen Reactions  . Sulfonamide Derivatives Rash    Current Outpatient Prescriptions  Medication Sig Dispense Refill  . aspirin 325 MG tablet Take 325 mg by mouth daily.      . calcitonin, salmon, (MIACALCIN/FORTICAL) 200 UNIT/ACT nasal spray Place 1 spray into the nose daily.     . Calcium Carb-Cholecalciferol (CALCIUM 600 + D PO) Take by mouth daily.    . Cholecalciferol (VITAMIN D-3) 1000 UNITS CAPS Take 1,000 Units by mouth once.    . diltiazem (CARDIZEM) 120 MG tablet Take 120 mg by mouth daily.  4  . propranolol (INDERAL) 40 MG tablet Take 40 mg by mouth 2 (two) times daily.       No current facility-administered medications for this visit.    Past Medical History  Diagnosis Date  . MITRAL VALVE PROLAPSE   . MITRAL INSUFFICIENCY   . FIBRILLATION, ATRIAL   . CARDIOMYOPATHY   . Atrial septal aneurysm     Past Surgical History  Procedure Laterality Date  . Abdominal hysterectomy    . Cervical spine surgery      ROS:  As stated in the HPI and negative for all other systems.  PHYSICAL EXAM BP 105/62 mmHg  Pulse 65  Ht 5\' 4"  (1.626 m)  Wt 131 lb 9.6 oz (59.693 kg)  BMI 22.58 kg/m2 GENERAL:  Well appearing NECK:  No jugular venous distention, waveform within normal limits, carotid upstroke brisk and symmetric, no bruits, no thyromegaly LUNGS:  Clear to auscultation bilaterally CHEST:  Unremarkable HEART:  PMI not displaced or  sustained,S1 and S2 within normal limits, no S3, no S4, positive clicks, no rubs, no murmurs ABD:  Flat, positive bowel sounds normal in frequency in pitch, no bruits, no rebound, no guarding, no midline pulsatile mass, no hepatomegaly, no splenomegaly EXT:  2 plus pulses throughout, no edema, no cyanosis no clubbing  EKG:  Sinus rhythm, rate 65, axis within normal limits, intervals within normal limits, no acute ST-T wave changes.  08/21/2014  ASSESSMENT AND PLAN   MITRAL VALVE PROLAPSE -  I will follow up with an echocardiogram.    CARDIOMYOPATHY Her EF was normal on the last echo.  No change in therapy is indicated.

## 2014-08-23 ENCOUNTER — Encounter (HOSPITAL_COMMUNITY): Payer: Self-pay | Admitting: Cardiovascular Disease

## 2014-08-25 ENCOUNTER — Other Ambulatory Visit (HOSPITAL_COMMUNITY): Payer: Self-pay | Admitting: Cardiology

## 2014-08-25 ENCOUNTER — Ambulatory Visit (HOSPITAL_COMMUNITY)
Admission: RE | Admit: 2014-08-25 | Discharge: 2014-08-25 | Disposition: A | Discharge status: Home / Self Care | Payer: Medicare HMO | Source: Ambulatory Visit | Attending: Cardiology | Admitting: Cardiology

## 2014-08-25 DIAGNOSIS — I059 Rheumatic mitral valve disease, unspecified: Secondary | ICD-10-CM

## 2014-08-25 DIAGNOSIS — I482 Chronic atrial fibrillation, unspecified: Secondary | ICD-10-CM

## 2014-08-25 DIAGNOSIS — I729 Aneurysm of unspecified site: Secondary | ICD-10-CM | POA: Insufficient documentation

## 2014-08-25 NOTE — Progress Notes (Signed)
2D Echocardiogram Complete.  08/25/2014   Deliah Boston, RDCS   Preliminary Technician Findings:  There is a large Atrial Septal Aneurysm, as seen with prior Echocardiograms.

## 2014-08-29 ENCOUNTER — Telehealth: Payer: Self-pay | Admitting: Cardiology

## 2014-08-29 NOTE — Telephone Encounter (Signed)
Returning your call,comcerning her test results.

## 2014-08-29 NOTE — Telephone Encounter (Signed)
Pt. Called and informed about her results

## 2015-02-24 ENCOUNTER — Other Ambulatory Visit: Payer: Self-pay | Admitting: Family Medicine

## 2015-03-20 ENCOUNTER — Other Ambulatory Visit: Payer: Self-pay | Admitting: Family Medicine

## 2015-05-13 ENCOUNTER — Other Ambulatory Visit: Payer: Self-pay | Admitting: Family Medicine

## 2015-07-31 ENCOUNTER — Telehealth: Payer: Self-pay | Admitting: Family Medicine

## 2015-07-31 MED ORDER — ALENDRONATE SODIUM 70 MG PO TABS: 70.0000 mg | ORAL_TABLET | ORAL | Status: DC

## 2015-07-31 NOTE — Telephone Encounter (Signed)
Patient advised as directed below. Patient verbalized understanding. Patient states she will start the Alendronate 70 mg at the beginning of 2017. Patient will call pharmacy to put RX on hold until then.

## 2015-07-31 NOTE — Telephone Encounter (Signed)
Attempted to contact patient. No answer nor voicemail.  

## 2015-07-31 NOTE — Telephone Encounter (Signed)
Please review

## 2015-07-31 NOTE — Telephone Encounter (Signed)
Can switch to Alendronate 70 mg one tablet each week 30 minutes before breakfast with a glass of water and can't lie down until after eating breakfast #12 & 3 RF.

## 2015-07-31 NOTE — Telephone Encounter (Signed)
Pt stated that calcitonin, salmon, (MIACALCIN/FORTICAL) 200 UNIT/ACT nasal spray will not be covered by her insurance next year and wanted to know if she could try something else or what she should do. Pt stated she can not afford to pay out of pocket. Please advise. Thanks TNP

## 2015-08-02 ENCOUNTER — Ambulatory Visit (INDEPENDENT_AMBULATORY_CARE_PROVIDER_SITE_OTHER): Payer: Medicare HMO

## 2015-08-02 DIAGNOSIS — Z23 Encounter for immunization: Secondary | ICD-10-CM | POA: Diagnosis not present

## 2015-08-03 ENCOUNTER — Other Ambulatory Visit: Payer: Self-pay | Admitting: Family Medicine

## 2015-08-03 DIAGNOSIS — Z1231 Encounter for screening mammogram for malignant neoplasm of breast: Secondary | ICD-10-CM

## 2015-08-07 DIAGNOSIS — D225 Melanocytic nevi of trunk: Secondary | ICD-10-CM | POA: Diagnosis not present

## 2015-08-07 DIAGNOSIS — D2261 Melanocytic nevi of right upper limb, including shoulder: Secondary | ICD-10-CM | POA: Diagnosis not present

## 2015-08-07 DIAGNOSIS — D2271 Melanocytic nevi of right lower limb, including hip: Secondary | ICD-10-CM | POA: Diagnosis not present

## 2015-08-07 DIAGNOSIS — D2262 Melanocytic nevi of left upper limb, including shoulder: Secondary | ICD-10-CM | POA: Diagnosis not present

## 2015-08-08 ENCOUNTER — Other Ambulatory Visit: Payer: Self-pay | Admitting: Family Medicine

## 2015-08-08 ENCOUNTER — Ambulatory Visit
Admission: RE | Admit: 2015-08-08 | Discharge: 2015-08-08 | Disposition: A | Discharge status: Home / Self Care | Payer: Medicare HMO | Source: Ambulatory Visit | Attending: Family Medicine | Admitting: Family Medicine

## 2015-08-08 DIAGNOSIS — Z1231 Encounter for screening mammogram for malignant neoplasm of breast: Secondary | ICD-10-CM | POA: Diagnosis not present

## 2015-08-13 ENCOUNTER — Telehealth: Payer: Self-pay

## 2015-08-13 NOTE — Telephone Encounter (Signed)
-----   Message from Margo Common, [place] sent at 08/13/2015  4:53 AM EST ----- Normal mammograms without signs of mass or lesion. Recheck in a year.

## 2015-08-13 NOTE — Telephone Encounter (Signed)
Left message to call back  

## 2015-08-13 NOTE — Telephone Encounter (Signed)
Advised patient as below.  

## 2015-08-29 ENCOUNTER — Other Ambulatory Visit: Payer: Self-pay | Admitting: Family Medicine

## 2015-10-15 ENCOUNTER — Telehealth: Payer: Self-pay | Admitting: Family Medicine

## 2015-10-15 NOTE — Telephone Encounter (Signed)
Please review. Thanks!  

## 2015-10-15 NOTE — Telephone Encounter (Signed)
She is going to need something for her osteoporosis.  She will run out of her nasal spray for it on Feb 9th.  She said insurance would not pay much the nasal spray.  She said you had talked about switching her to fosamax..  [facility] [place]  Please call patient and advise of what you want to do.  Her call  [phone].  Thanks Con Memos

## 2015-10-16 NOTE — Telephone Encounter (Signed)
Check with pharmacy to see if the prescription sent on 07-31-15 had been put on hold until February 2017. If not authorize Alendronate 70 mg once a week for a year (#12 with 3 RF). Take it with a full glass of water on an empty stomach before breakfast. Don't lie down for and hour after eating breakfast

## 2015-10-17 NOTE — Telephone Encounter (Signed)
[facility] and they still have RX from 07/31/2015. They will fill RX for patient. Patient is aware.

## 2015-10-18 DIAGNOSIS — L538 Other specified erythematous conditions: Secondary | ICD-10-CM | POA: Diagnosis not present

## 2015-10-18 DIAGNOSIS — L820 Inflamed seborrheic keratosis: Secondary | ICD-10-CM | POA: Diagnosis not present

## 2015-10-29 DIAGNOSIS — H2513 Age-related nuclear cataract, bilateral: Secondary | ICD-10-CM | POA: Diagnosis not present

## 2015-10-29 DIAGNOSIS — H35033 Hypertensive retinopathy, bilateral: Secondary | ICD-10-CM | POA: Diagnosis not present

## 2015-11-19 ENCOUNTER — Encounter: Payer: Medicare HMO | Admitting: Family Medicine

## 2015-11-19 ENCOUNTER — Ambulatory Visit (INDEPENDENT_AMBULATORY_CARE_PROVIDER_SITE_OTHER): Payer: Medicare HMO | Admitting: Family Medicine

## 2015-11-19 ENCOUNTER — Encounter: Payer: Self-pay | Admitting: Family Medicine

## 2015-11-19 VITALS — BP 98/58 | HR 64 | Temp 98.9°F | Resp 14 | Ht 64.5 in | Wt 134.2 lb

## 2015-11-19 DIAGNOSIS — I482 Chronic atrial fibrillation, unspecified: Secondary | ICD-10-CM

## 2015-11-19 DIAGNOSIS — E559 Vitamin D deficiency, unspecified: Secondary | ICD-10-CM | POA: Insufficient documentation

## 2015-11-19 DIAGNOSIS — E7800 Pure hypercholesterolemia, unspecified: Secondary | ICD-10-CM

## 2015-11-19 DIAGNOSIS — Z0000 Encounter for general adult medical examination without abnormal findings: Secondary | ICD-10-CM | POA: Diagnosis not present

## 2015-11-19 DIAGNOSIS — I059 Rheumatic mitral valve disease, unspecified: Secondary | ICD-10-CM | POA: Diagnosis not present

## 2015-11-19 DIAGNOSIS — M810 Age-related osteoporosis without current pathological fracture: Secondary | ICD-10-CM | POA: Diagnosis not present

## 2015-11-19 LAB — POCT URINALYSIS DIPSTICK
BILIRUBIN UA: NEGATIVE
Blood, UA: NEGATIVE
GLUCOSE UA: NEGATIVE
KETONES UA: NEGATIVE
LEUKOCYTES UA: NEGATIVE
Nitrite, UA: NEGATIVE
PROTEIN UA: NEGATIVE
SPEC GRAV UA: 1.025
Urobilinogen, UA: 0.2
pH, UA: 6

## 2015-11-19 NOTE — Progress Notes (Signed)
Patient ID: Kristina Jordan, female   DOB: 03-20-1951, 65 y.o.   MRN: [id number]       Patient: Kristina Jordan, Female    DOB: August 08, 1951, 65 y.o.   MRN: [id number] Visit Date: 11/19/2015  Today's Provider: Vernie Murders, PA   Chief Complaint  Patient presents with  . Annual Exam   Subjective:    Annual physical exam Kristina Jordan is a 65 y.o. female who presents today for health maintenance and complete physical. She feels well. She reports exercising 3 times per week. She reports she is sleeping average 5 hours per night.  ----------------------------------------------------------------- Tdap: 10/21/2010 Prevnar: 11/17/2014 Zoster: 10/27/2011 Pneumovax: 06/02/2011  Review of Systems  Constitutional: Negative.   HENT:       Nasal congestion  Eyes: Negative.   Respiratory: Negative.   Cardiovascular: Negative.   Gastrointestinal: Negative.   Endocrine: Negative.   Genitourinary: Negative.   Musculoskeletal: Negative.   Skin: Negative.   Allergic/Immunologic: Negative.   Neurological: Negative.   Hematological: Negative.   Psychiatric/Behavioral: Negative.     Social History      She  reports that she has quit smoking. She does not have any smokeless tobacco history on file. She reports that she does not drink alcohol or use illicit drugs.       Social History   Social History  . Marital Status: Married    Spouse Name: N/A  . Number of Children: N/A  . Years of Education: N/A   Social History Main Topics  . Smoking status: Former Research scientist (life sciences)  . Smokeless tobacco: None  . Alcohol Use: No  . Drug Use: No  . Sexual Activity: Yes   Other Topics Concern  . None   Social History Narrative    Past Medical History  Diagnosis Date  . MITRAL VALVE PROLAPSE   . MITRAL INSUFFICIENCY   . FIBRILLATION, ATRIAL   . CARDIOMYOPATHY   . Atrial septal aneurysm     Patient Active Problem List   Diagnosis Date Noted  . Hypercholesterolemia without hypertriglyceridemia  11/19/2015  . Avitaminosis D 11/19/2015  . Atypical chest pain 09/09/2011  . Non-ST elevation MI (NSTEMI) ([place]) 09/09/2011  . 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 MITRAL INSUFFICIENCY 12/26/2009  . Mitral valve disorder 12/24/2009  . FIBRILLATION, ATRIAL 12/24/2009  . Heart disease 11/01/2009  . 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 Acquired trigger finger 01/02/2009  . Need for prophylactic hormone replacement therapy (postmenopausal) 10/09/2006  . OP (osteoporosis) 10/09/2006    Past Surgical History  Procedure Laterality Date  . Abdominal hysterectomy    . Cervical spine surgery    . Left heart catheterization with coronary angiogram N/A 09/10/2011    Procedure: LEFT HEART CATHETERIZATION WITH CORONARY ANGIOGRAM;  Surgeon: Wellington Hampshire, MD;  Location: [facility];  Service: Cardiovascular;  Laterality: N/A;    Family History        Family Status  Relation Status Death Age  . Father Deceased   . Sister Deceased   . Brother Alive   . Mother Deceased   . Sister Alive   . Sister Alive   . Brother Alive   . Brother Alive         Her family history includes Heart attack in her mother; Multiple sclerosis in her sister.    Allergies  Allergen Reactions  . Sulfonamide Derivatives Rash    Previous Medications   ALENDRONATE (FOSAMAX) 70 MG TABLET    Take 1 tablet (70 mg total) by  mouth every 7 (seven) days. Take with a full glass of water on an empty stomach. Don't lie down until after eating breakfast.   ASPIRIN 325 MG TABLET    Take 325 mg by mouth daily.     CALCITONIN, SALMON, (MIACALCIN/FORTICAL) 200 UNIT/ACT NASAL SPRAY    USE 1 SPRAY IN ONE NOSTRIL EVERY DAY (ALTERNATE RIGHT AND LEFT NOSTRIL)   CALCIUM CARB-CHOLECALCIFEROL (CALCIUM 600 + D PO)    Take by mouth daily.   CHOLECALCIFEROL (VITAMIN D-3) 1000 UNITS CAPS    Take 1,000 Units by mouth once.   DILTIAZEM (CARDIZEM) 120 MG TABLET    TAKE 1 TABLET BY MOUTH ONCE A DAY   PROPRANOLOL (INDERAL) 40 MG TABLET    TAKE 1 TABLET BY  MOUTH TWICE A DAY    Patient Care Team: Margo Common, PA as PCP - General (Family Medicine)     Objective:   Vitals: BP 98/58 mmHg  Pulse 64  Temp(Src) 98.9 F (37.2 C) (Oral)  Resp 14  Ht 5' 4.5" (1.638 m)  Wt 134 lb 3.2 oz (60.873 kg)  BMI 22.69 kg/m2  SpO2 97%  Wt Readings from Last 3 Encounters:  11/19/15 134 lb 3.2 oz (60.873 kg)  08/21/14 131 lb 9.6 oz (59.693 kg)  08/16/13 126 lb (57.153 kg)    Physical Exam  Constitutional: She is oriented to person, place, and time. She appears well-developed and well-nourished.  HENT:  Head: Normocephalic and atraumatic.  Right Ear: External ear normal.  Left Ear: External ear normal.  Nose: Nose normal.  Mouth/Throat: Oropharynx is clear and moist.  Eyes: Conjunctivae and EOM are normal. Pupils are equal, round, and reactive to light. Right eye exhibits no discharge.  Neck: Normal range of motion. Neck supple. No tracheal deviation present. No thyromegaly present.  Cardiovascular: Normal rate, regular rhythm, normal heart sounds and intact distal pulses.   No murmur heard. Pulmonary/Chest: Effort normal and breath sounds normal. No respiratory distress. She has no wheezes. She has no rales. She exhibits no tenderness.  Abdominal: Soft. She exhibits no distension and no mass. There is no tenderness. There is no rebound and no guarding.  Genitourinary:  Total abdominal hysterectomy in 1995. History of left breast biopsy of a benign lesion in 1996.  Musculoskeletal: Normal range of motion. She exhibits no edema or tenderness.  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 Arnoldo Morale (neurosurgeon).  Lymphadenopathy:    She has no cervical adenopathy.  Neurological: She is alert and oriented to person, place, and time. She has normal reflexes. No cranial nerve deficit. She exhibits normal muscle tone. Coordination normal.  Skin: Skin is warm and dry. No rash noted. No erythema.  Psychiatric: She  has a normal mood and affect. Her behavior is normal. Judgment and thought content normal.     Depression Screen PHQ 2/9 Scores 11/19/2015  PHQ - 2 Score 0    Assessment & Plan:     Routine Health Maintenance and Physical Exam  Exercise Activities and Dietary recommendations Goals    Ride stationary bike for 30 minutes 3 days a week. Continue low fat diet.      Immunization History  Administered Date(s) Administered  . Influenza, High Dose Seasonal PF 08/02/2015  . Pneumococcal Conjugate-13 11/17/2014  . Pneumococcal Polysaccharide-23 06/02/2011  . Tdap 10/21/2010  . Zoster 10/27/2011    Health Maintenance  Topic Date Due  . Hepatitis C Screening  Oct 27, 1950  . INFLUENZA  VACCINE  04/15/2016  . COLONOSCOPY  05/18/2017  . MAMMOGRAM  08/07/2017  . TETANUS/TDAP  10/21/2020  . DEXA SCAN  Completed  . ZOSTAVAX  Completed  . PNA vac Low Risk Adult  Completed      Discussed health benefits of physical activity, and encouraged her to engage in regular exercise appropriate for her age and condition.    --------------------------------------------------------------------  1. Annual physical exam Feeling well without specific complaints. Immunizations up to date. Will give future order for fasting labs. Last mammogram normal 08-11-15. Colonoscopy on 05-19-2007 showed only mild internal hemorrhoids without polyps. - POCT urinalysis dipstick - Hepatitis C Antibody; Future  2. Hypercholesterolemia without hypertriglyceridemia Past history of elevated cholesterol. Will place future order for CMP, TSH and lipid panel. - Comprehensive metabolic panel; Future - TSH; Future - Lipid panel; Future  3. OP (osteoporosis) Had been switched from Miacalcin to Fosamax 70 mg once a week. Will schedule BMD testing.  - DG Bone Density  4. Mitral valve disorder History of MVP with mitral insufficiency, cardiomyopathy and A.fib. Is scheduled for follow up echocardiogram by Dr. Percival Spanish  (cardiologist) in June 2017. - CBC with Differential/Platelet; Future  5. Chronic atrial fibrillation (HCC) Denies palpitations. Presently controlled on Cardizem and Inderal by Dr. Percival Spanish.Marland Kitchen

## 2015-11-30 ENCOUNTER — Ambulatory Visit
Admission: RE | Admit: 2015-11-30 | Discharge: 2015-11-30 | Disposition: A | Discharge status: Home / Self Care | Payer: Medicare HMO | Source: Ambulatory Visit | Attending: Family Medicine | Admitting: Family Medicine

## 2015-11-30 ENCOUNTER — Ambulatory Visit
Admission: RE | Admit: 2015-11-30 | Discharge: 2015-11-30 | Disposition: A | Discharge status: Home / Self Care | Payer: Medicare HMO | Source: Ambulatory Visit | Attending: Nurse Practitioner | Admitting: Nurse Practitioner

## 2015-11-30 ENCOUNTER — Other Ambulatory Visit: Payer: Self-pay

## 2015-11-30 ENCOUNTER — Telehealth: Payer: Self-pay

## 2015-11-30 ENCOUNTER — Ambulatory Visit (INDEPENDENT_AMBULATORY_CARE_PROVIDER_SITE_OTHER): Payer: Medicare HMO | Admitting: Family Medicine

## 2015-11-30 ENCOUNTER — Encounter: Payer: Self-pay | Admitting: Family Medicine

## 2015-11-30 VITALS — BP 102/58 | HR 73 | Temp 97.6°F | Resp 14 | Wt 134.8 lb

## 2015-11-30 DIAGNOSIS — M25552 Pain in left hip: Secondary | ICD-10-CM | POA: Diagnosis not present

## 2015-11-30 DIAGNOSIS — I059 Rheumatic mitral valve disease, unspecified: Secondary | ICD-10-CM

## 2015-11-30 DIAGNOSIS — Z0000 Encounter for general adult medical examination without abnormal findings: Secondary | ICD-10-CM

## 2015-11-30 DIAGNOSIS — E7800 Pure hypercholesterolemia, unspecified: Secondary | ICD-10-CM

## 2015-11-30 MED ORDER — NAPROXEN 500 MG PO TABS: 500.0000 mg | ORAL_TABLET | Freq: Two times a day (BID) | ORAL | Status: DC

## 2015-11-30 NOTE — Patient Instructions (Signed)
Hip Bursitis Bursitis is a swelling and soreness (inflammation) of a fluid-filled sac (bursa). This sac overlies and protects the joints.  CAUSES   Injury.  Overuse of the muscles surrounding the joint.  Arthritis.  Gout.  Infection.  Cold weather.  Inadequate warm-up and conditioning prior to activities. The cause may not be known.  SYMPTOMS   Mild to severe irritation.  Tenderness and swelling over the outside of the hip.  Pain with motion of the hip.  If the bursa becomes infected, a fever may be present. Redness, tenderness, and warmth will develop over the hip. Symptoms usually lessen in 3 to 4 weeks with treatment, but can come back. TREATMENT If conservative treatment does not work, your caregiver may advise draining the bursa and injecting cortisone into the area. This may speed up the healing process. This may also be used as an initial treatment of choice. HOME CARE INSTRUCTIONS   Apply ice to the affected area for 15-20 minutes every 3 to 4 hours while awake for the first 2 days. Put the ice in a plastic bag and place a towel between the bag of ice and your skin.  Rest the painful joint as much as possible, but continue to put the joint through a normal range of motion at least 4 times per day. When the pain lessens, begin normal, slow movements and usual activities to help prevent stiffness of the hip.  Only take over-the-counter or prescription medicines for pain, discomfort, or fever as directed by your caregiver.  Use crutches to limit weight bearing on the hip joint, if advised.  Elevate your painful hip to reduce swelling. Use pillows for propping and cushioning your legs and hips.  Gentle massage may provide comfort and decrease swelling. SEEK IMMEDIATE MEDICAL CARE IF:   Your pain increases even during treatment, or you are not improving.  You have a fever.  You have heat and inflammation over the involved bursa.  You have any other questions or  concerns. MAKE SURE YOU:   Understand these instructions.  Will watch your condition.  Will get help right away if you are not doing well or get worse.   This information is not intended to replace advice given to you by your health care provider. Make sure you discuss any questions you have with your health care provider.   Document Released: 02/21/2002 Document Revised: 11/24/2011 Document Reviewed: 04/03/2015 Elsevier Interactive Patient Education 2016 [employer]  

## 2015-11-30 NOTE — Telephone Encounter (Signed)
Patient advised as directed below. Patient verbalized understanding. [facility].

## 2015-11-30 NOTE — Telephone Encounter (Signed)
-----   Message from Margo Common, [place] sent at 11/30/2015  4:35 PM EDT ----- No bony abnormality on x-ray. Recommend EC-Naprosyn 500 mg BID #60 (which pharmacy?) to use as needed. Recheck in 10-14 days if no better.

## 2015-11-30 NOTE — Progress Notes (Signed)
Patient ID: Kristina Jordan, female   DOB: 06-26-1951, 65 y.o.   MRN: [id number]   Patient: Kristina Jordan Female    DOB: June 28, 1951   65 y.o.   MRN: [id number] Visit Date: 11/30/2015  Today's Provider: Vernie Murders, PA   Chief Complaint  Patient presents with  . Hip Pain   Subjective:    Hip Pain  The incident occurred more than 1 week ago. There was no injury mechanism. The pain is present in the left hip and left thigh. The quality of the pain is described as aching. The pain is at a severity of 8/10. The pain has been intermittent (Only has pain when walking or standing. Causes her to limp.) since onset. Associated symptoms include a loss of motion. Associated symptoms comments: History of significant osteoporosis and had to change to Alendronate from the Miacalcin due to loss of insurance coverage on that medication.. She reports no foreign bodies present. The symptoms are aggravated by movement and weight bearing.    Past Medical History  Diagnosis Date  . MITRAL VALVE PROLAPSE   . MITRAL INSUFFICIENCY   . FIBRILLATION, ATRIAL   . CARDIOMYOPATHY   . Atrial septal aneurysm    Past Surgical History  Procedure Laterality Date  . Abdominal hysterectomy    . Cervical spine surgery    . Left heart catheterization with coronary angiogram N/A 09/10/2011    Procedure: LEFT HEART CATHETERIZATION WITH CORONARY ANGIOGRAM;  Surgeon: Wellington Hampshire, MD;  Location: [facility];  Service: Cardiovascular;  Laterality: N/A;   Family History  Problem Relation Age of Onset  . Coronary artery disease    . Thyroid disease    . Multiple sclerosis Sister   . Heart attack Mother    Previous Medications   ALENDRONATE (FOSAMAX) 70 MG TABLET    Take 1 tablet (70 mg total) by mouth every 7 (seven) days. Take with a full glass of water on an empty stomach. Don't lie down until after eating breakfast.   ASPIRIN 325 MG TABLET    Take 325 mg by mouth daily.     CALCITONIN, SALMON, (MIACALCIN/FORTICAL)  200 UNIT/ACT NASAL SPRAY    USE 1 SPRAY IN ONE NOSTRIL EVERY DAY (ALTERNATE RIGHT AND LEFT NOSTRIL)   CALCIUM CARB-CHOLECALCIFEROL (CALCIUM 600 + D PO)    Take by mouth daily.   CHOLECALCIFEROL (VITAMIN D-3) 1000 UNITS CAPS    Take 1,000 Units by mouth once.   DILTIAZEM (CARDIZEM) 120 MG TABLET    TAKE 1 TABLET BY MOUTH ONCE A DAY   PROPRANOLOL (INDERAL) 40 MG TABLET    TAKE 1 TABLET BY MOUTH TWICE A DAY   Allergies  Allergen Reactions  . Sulfonamide Derivatives Rash    Review of Systems  Constitutional: Negative.   HENT: Negative.   Eyes: Negative.   Respiratory: Negative.   Cardiovascular: Negative.   Gastrointestinal: Negative.   Endocrine: Negative.   Genitourinary: Negative.   Musculoskeletal: Positive for arthralgias.  Skin: Negative.   Allergic/Immunologic: Negative.   Neurological: Negative.   Hematological: Negative.   Psychiatric/Behavioral: Negative.     Social History  Substance Use Topics  . Smoking status: Former Research scientist (life sciences)  . Smokeless tobacco: Not on file  . Alcohol Use: No   Objective:   BP 102/58 mmHg  Pulse 73  Temp(Src) 97.6 F (36.4 C) (Oral)  Resp 14  Wt 134 lb 12.8 oz (61.145 kg)  Physical Exam  Constitutional: She is oriented to person, place, and time.  She appears well-developed and well-nourished. No distress.  HENT:  Head: Normocephalic and atraumatic.  Right Ear: Hearing normal.  Left Ear: Hearing normal.  Nose: Nose normal.  Eyes: Conjunctivae and lids are normal. Right eye exhibits no discharge. Left eye exhibits no discharge. No scleral icterus.  Pulmonary/Chest: Effort normal. No respiratory distress.  Musculoskeletal: She exhibits tenderness.  Slight tenderness around the left greater trochanter. Limping gait favoring the left hip. Decrease in hip ROM to test figure-4 maneuver. Normal pulses and no loss of sensation.  Neurological: She is alert and oriented to person, place, and time.  Skin: Skin is intact. No lesion and no rash  noted.  Psychiatric: She has a normal mood and affect. Her speech is normal and behavior is normal. Thought content normal.      Assessment & Plan:     1. Left hip pain Onset over the past week. No known injury. History of significant osteoporosis. Unable to put left ankle on the right knee and allow the knee to lower. Suspect arthritis with bursitis versus atypical fracture secondary to osteoporosis. Will get x-ray evaluation and consider NSAID for pain and inflammation. Recheck pending report. - DG HIP UNILAT W OR W/O PELVIS 2-3 VIEWS LEFT

## 2015-12-01 LAB — CBC WITH DIFFERENTIAL/PLATELET
Basophils Absolute: 0 10*3/uL (ref 0.0–0.2)
Basos: 0 %
EOS (ABSOLUTE): 0.3 10*3/uL (ref 0.0–0.4)
Eos: 6 %
HEMATOCRIT: 36.4 % (ref 34.0–46.6)
Hemoglobin: 12.4 g/dL (ref 11.1–15.9)
IMMATURE GRANULOCYTES: 0 %
Immature Grans (Abs): 0 10*3/uL (ref 0.0–0.1)
LYMPHS ABS: 1.1 10*3/uL (ref 0.7–3.1)
Lymphs: 22 %
MCH: 28.2 pg (ref 26.6–33.0)
MCHC: 34.1 g/dL (ref 31.5–35.7)
MCV: 83 fL (ref 79–97)
MONOS ABS: 0.4 10*3/uL (ref 0.1–0.9)
Monocytes: 7 %
NEUTROS PCT: 65 %
Neutrophils Absolute: 3.2 10*3/uL (ref 1.4–7.0)
PLATELETS: 225 10*3/uL (ref 150–379)
RBC: 4.39 x10E6/uL (ref 3.77–5.28)
RDW: 13.3 % (ref 12.3–15.4)
WBC: 5 10*3/uL (ref 3.4–10.8)

## 2015-12-01 LAB — LIPID PANEL
CHOL/HDL RATIO: 2.3 ratio (ref 0.0–4.4)
CHOLESTEROL TOTAL: 169 mg/dL (ref 100–199)
HDL: 74 mg/dL (ref >39)
LDL Calculated: 80 mg/dL (ref 0–99)
TRIGLYCERIDES: 76 mg/dL (ref 0–149)
VLDL Cholesterol Cal: 15 mg/dL (ref 5–40)

## 2015-12-01 LAB — COMPREHENSIVE METABOLIC PANEL
A/G RATIO: 1.6 (ref 1.2–2.2)
ALK PHOS: 106 IU/L (ref 39–117)
ALT: 17 IU/L (ref 0–32)
AST: 18 IU/L (ref 0–40)
Albumin: 4.2 g/dL (ref 3.6–4.8)
BUN/Creatinine Ratio: 26 (ref 11–26)
BUN: 20 mg/dL (ref 8–27)
Bilirubin Total: 0.3 mg/dL (ref 0.0–1.2)
CO2: 24 mmol/L (ref 18–29)
CREATININE: 0.78 mg/dL (ref 0.57–1.00)
Calcium: 9.4 mg/dL (ref 8.7–10.3)
Chloride: 105 mmol/L (ref 96–106)
GFR calc Af Amer: 92 mL/min/{1.73_m2} (ref >59)
GFR, EST NON AFRICAN AMERICAN: 79 mL/min/{1.73_m2} (ref >59)
GLOBULIN, TOTAL: 2.7 g/dL (ref 1.5–4.5)
Glucose: 92 mg/dL (ref 65–99)
POTASSIUM: 4.3 mmol/L (ref 3.5–5.2)
Sodium: 141 mmol/L (ref 134–144)
Total Protein: 6.9 g/dL (ref 6.0–8.5)

## 2015-12-01 LAB — TSH: TSH: 1.43 u[IU]/mL (ref 0.450–4.500)

## 2015-12-01 LAB — HEPATITIS C ANTIBODY

## 2015-12-03 ENCOUNTER — Telehealth: Payer: Self-pay

## 2015-12-03 NOTE — Telephone Encounter (Signed)
-----   Message from Margo Common, [place] sent at 12/03/2015  8:24 AM EDT ----- All blood tests normal. Continue present medications and recheck in 6 months.

## 2015-12-03 NOTE — Telephone Encounter (Signed)
Patient advised as directed below. Patient verbalized understanding.  

## 2015-12-12 DIAGNOSIS — M85862 Other specified disorders of bone density and structure, left lower leg: Secondary | ICD-10-CM | POA: Diagnosis not present

## 2015-12-12 DIAGNOSIS — M810 Age-related osteoporosis without current pathological fracture: Secondary | ICD-10-CM | POA: Diagnosis not present

## 2015-12-20 ENCOUNTER — Telehealth: Payer: Self-pay | Admitting: Family Medicine

## 2015-12-20 NOTE — Telephone Encounter (Signed)
Per patient she went to [place]. Contacted [facility] to request a copy of results. Per receptionist she will fax results to [place].

## 2015-12-20 NOTE — Telephone Encounter (Signed)
Don't see a report in chart. Ask where she got the BMD done and request copy of report.

## 2015-12-20 NOTE — Telephone Encounter (Signed)
Pt stated she had her bone density test done on 12/12/15 and she wanted to see if we had the results. Thanks TNP

## 2015-12-20 NOTE — Telephone Encounter (Signed)
Please advise if you have seen BMD results.

## 2015-12-25 ENCOUNTER — Telehealth: Payer: Self-pay | Admitting: Family Medicine

## 2015-12-25 NOTE — Telephone Encounter (Signed)
Patient advised of BMD results. Results to be scanned in chart.

## 2015-12-25 NOTE — Telephone Encounter (Signed)
Pt called to see if we had gotten her bone density back yet. I advised pt that per message in chart Lexine Baton contacted [facility] on 12/20/15 and was advised that they would fax [place] the results. Pt would like to get the results if we have received them. Please advise. Thanks TNP

## 2016-01-22 ENCOUNTER — Other Ambulatory Visit: Payer: Self-pay | Admitting: Family Medicine

## 2016-01-29 ENCOUNTER — Ambulatory Visit (INDEPENDENT_AMBULATORY_CARE_PROVIDER_SITE_OTHER): Payer: Medicare HMO | Admitting: Family Medicine

## 2016-01-29 ENCOUNTER — Encounter: Payer: Self-pay | Admitting: Family Medicine

## 2016-01-29 VITALS — BP 104/56 | HR 67 | Temp 98.1°F | Resp 14 | Wt 135.6 lb

## 2016-01-29 DIAGNOSIS — B351 Tinea unguium: Secondary | ICD-10-CM | POA: Diagnosis not present

## 2016-01-29 NOTE — Progress Notes (Signed)
Patient ID: Kristina Jordan, female   DOB: 03-06-1951, 65 y.o.   MRN: [id number]   Patient: Kristina Jordan Female    DOB: 05-12-1951   65 y.o.   MRN: [id number] Visit Date: 01/29/2016  Today's Provider: Vernie Murders, PA   Chief Complaint  Patient presents with  . Nail Problem   Subjective:    HPI Nail Problem:  Patient reports left great toe nail fell off on Sunday night. Patient denies pain. No known injury. History of onychomycosis and it loosened nail from bed.  Patient Active Problem List   Diagnosis Date Noted  . Hypercholesterolemia without hypertriglyceridemia 11/19/2015  . Avitaminosis D 11/19/2015  . Atypical chest pain 09/09/2011  . Non-ST elevation MI (NSTEMI) ([place]) 09/09/2011  . 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 MITRAL INSUFFICIENCY 12/26/2009  . Mitral valve disorder 12/24/2009  . FIBRILLATION, ATRIAL 12/24/2009  . Heart disease 11/01/2009  . 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 Acquired trigger finger 01/02/2009  . Need for prophylactic hormone replacement therapy (postmenopausal) 10/09/2006  . OP (osteoporosis) 10/09/2006   Past Surgical History  Procedure Laterality Date  . Abdominal hysterectomy    . Cervical spine surgery    . Left heart catheterization with coronary angiogram N/A 09/10/2011    Procedure: LEFT HEART CATHETERIZATION WITH CORONARY ANGIOGRAM;  Surgeon: Wellington Hampshire, MD;  Location: [facility];  Service: Cardiovascular;  Laterality: N/A;   Family History  Problem Relation Age of Onset  . Coronary artery disease    . Thyroid disease    . Multiple sclerosis Sister   . Heart attack Mother    Previous Medications   ALENDRONATE (FOSAMAX) 70 MG TABLET    Take 1 tablet (70 mg total) by mouth every 7 (seven) days. Take with a full glass of water on an empty stomach. Don't lie down until after eating breakfast.   ASPIRIN 325 MG TABLET    Take 325 mg by mouth daily.     CALCITONIN, SALMON, (MIACALCIN/FORTICAL) 200 UNIT/ACT NASAL SPRAY    USE 1 SPRAY IN ONE  NOSTRIL EVERY DAY (ALTERNATE RIGHT AND LEFT NOSTRIL)   CALCIUM CARB-CHOLECALCIFEROL (CALCIUM 600 + D PO)    Take by mouth daily.   CHOLECALCIFEROL (VITAMIN D-3) 1000 UNITS CAPS    Take 2,000 Units by mouth once.   DILTIAZEM (CARDIZEM) 120 MG TABLET    TAKE 1 TABLET BY MOUTH ONCE A DAY   NAPROXEN (NAPROSYN) 500 MG TABLET    Take 1 tablet (500 mg total) by mouth 2 (two) times daily with a meal.   PROPRANOLOL (INDERAL) 40 MG TABLET    TAKE ONE TABLET BY MOUTH TWICE DAILY   Allergies  Allergen Reactions  . Sulfonamide Derivatives Rash   Review of Systems  Constitutional: Negative.   HENT: Negative.   Eyes: Negative.   Respiratory: Negative.   Cardiovascular: Negative.   Gastrointestinal: Negative.   Endocrine: Negative.   Genitourinary: Negative.   Musculoskeletal: Negative.   Skin: Negative.   Allergic/Immunologic: Negative.   Neurological: Negative.   Hematological: Negative.   Psychiatric/Behavioral: Negative.    Social History  Substance Use Topics  . Smoking status: Former Research scientist (life sciences)  . Smokeless tobacco: Not on file  . Alcohol Use: No   Objective:   BP 104/56 mmHg  Pulse 67  Temp(Src) 98.1 F (36.7 C) (Oral)  Resp 14  Wt 135 lb 9.6 oz (61.508 kg)  SpO2 98%  Physical Exam  Constitutional: She is oriented to person, place, and time. She appears well-developed  and well-nourished. No distress.  HENT:  Head: Normocephalic and atraumatic.  Right Ear: Hearing normal.  Left Ear: Hearing normal.  Nose: Nose normal.  Eyes: Conjunctivae and lids are normal. Right eye exhibits no discharge. Left eye exhibits no discharge. No scleral icterus.  Pulmonary/Chest: Effort normal. No respiratory distress.  Musculoskeletal: Normal range of motion.  Thick right great toenail and left second toenail distally (both). Left great toenail totally removed with normal nailbed. No sign of infection or bleeding.  Neurological: She is alert and oriented to person, place, and time.  Skin: Skin is  intact. No lesion and no rash noted.  Psychiatric: She has a normal mood and affect. Her speech is normal and behavior is normal. Thought content normal.      Assessment & Plan:     1. Onychomycosis Has had a thick nail on the left great toe for years. Recently separated from the nail bed and finally came off. No bleeding or sign of infection. Some thickness of the right great toenail and the left second toenail distally. May use OTC Anti-Fungal nail treatment or Lamisil cream. Recheck 6 months if no better.

## 2016-02-19 NOTE — Progress Notes (Signed)
HPI The patient presents for followup after a non-Q-wave myocardial infarction in 2012.   A cardiac catheterization which demonstrated normal coronary arteries. Her ejection fraction was low normal at that time although it was normal on the last echo. She did have some mild mitral valve prolapse with only mild regurgitation. She had a septal aneurysm with fenestrations incidentally noted. Since I last saw her she has done well.  The patient denies any new symptoms such as chest discomfort, neck or arm discomfort. There has been no new shortness of breath, PND or orthopnea. There has been no presyncope or syncope.  She does not feel palpitations .  She exercises a couple of times per week. She stays active at home with her grandchildren.   Allergies  Allergen Reactions  . Sulfonamide Derivatives Rash    Current Outpatient Prescriptions  Medication Sig Dispense Refill  . alendronate (FOSAMAX) 70 MG tablet Take 1 tablet (70 mg total) by mouth every 7 (seven) days. Take with a full glass of water on an empty stomach. Don't lie down until after eating breakfast. 12 tablet 3  . aspirin 325 MG tablet Take 325 mg by mouth daily.      . Calcium Carb-Cholecalciferol (CALCIUM 600 + D PO) Take by mouth daily.    . Cholecalciferol (VITAMIN D-3) 1000 UNITS CAPS Take 2,000 Units by mouth once.    . diltiazem (CARDIZEM) 120 MG tablet TAKE 1 TABLET BY MOUTH ONCE A DAY 90 tablet 3  . naproxen (NAPROSYN) 500 MG tablet Take 1 tablet (500 mg total) by mouth 2 (two) times daily with a meal. 60 tablet 0  . propranolol (INDERAL) 40 MG tablet TAKE ONE TABLET BY MOUTH TWICE DAILY 60 tablet 3   No current facility-administered medications for this visit.    Past Medical History  Diagnosis Date  . MITRAL VALVE PROLAPSE   . MITRAL INSUFFICIENCY   . FIBRILLATION, ATRIAL   . CARDIOMYOPATHY   . Atrial septal aneurysm     Past Surgical History  Procedure Laterality Date  . Abdominal hysterectomy    . Cervical  spine surgery    . Left heart catheterization with coronary angiogram N/A 09/10/2011    Procedure: LEFT HEART CATHETERIZATION WITH CORONARY ANGIOGRAM;  Surgeon: Wellington Hampshire, MD;  Location: [facility];  Service: Cardiovascular;  Laterality: N/A;    ROS:  As stated in the HPI and negative for all other systems.  PHYSICAL EXAM BP 107/68 mmHg  Pulse 68  Ht 5\' 4"  (1.626 m)  Wt 133 lb 6.4 oz (60.51 kg)  BMI 22.89 kg/m2 GENERAL:  Well appearing NECK:  No jugular venous distention, waveform within normal limits, carotid upstroke brisk and symmetric, no bruits, no thyromegaly LUNGS:  Clear to auscultation bilaterally CHEST:  Unremarkable HEART:  PMI not displaced or sustained,S1 and S2 within normal limits, no S3, no S4, positive clicks, no rubs, no murmurs ABD:  Flat, positive bowel sounds normal in frequency in pitch, no bruits, no rebound, no guarding, no midline pulsatile mass, no hepatomegaly, no splenomegaly EXT:  2 plus pulses throughout, no edema, no cyanosis no clubbing  EKG:  Sinus rhythm, rate 68, axis within normal limits, intervals within normal limits, no acute ST-T wave changes.  02/20/2016  ASSESSMENT AND PLAN   MITRAL VALVE PROLAPSE -  I will follow up with an echocardiogram in 18 .    CARDIOMYOPATHY Her EF was normal on the last echo.  No change in therapy is indicated.  SEPTAL ANEURYSM We discussed this.  We will follow this on echo in 18 months.

## 2016-02-20 ENCOUNTER — Encounter: Payer: Self-pay | Admitting: Cardiology

## 2016-02-20 ENCOUNTER — Ambulatory Visit (INDEPENDENT_AMBULATORY_CARE_PROVIDER_SITE_OTHER): Payer: Medicare HMO | Admitting: Cardiology

## 2016-02-20 VITALS — BP 107/68 | HR 68 | Ht 64.0 in | Wt 133.4 lb

## 2016-02-20 DIAGNOSIS — I341 Nonrheumatic mitral (valve) prolapse: Secondary | ICD-10-CM | POA: Diagnosis not present

## 2016-02-20 NOTE — Patient Instructions (Signed)
Medication Instructions:  DECREASE Aspirin 81 mg daily  Labwork: NONE  Testing/Procedures: Your physician has requested that you have an echocardiogram in 18 Months. Echocardiography is a painless test that uses sound waves to create images of your heart. It provides your doctor with information about the size and shape of your heart and how well your heart's chambers and valves are working. This procedure takes approximately one hour. There are no restrictions for this procedure.  Follow-Up: Your physician wants you to follow-up in: 18 Months. You will receive a reminder letter in the mail two months in advance. If you don't receive a letter, please call our office to schedule the follow-up appointment.\  Any Other Special Instructions Will Be Listed Below (If Applicable).   If you need a refill on your cardiac medications before your next appointment, please call your pharmacy.

## 2016-02-24 ENCOUNTER — Other Ambulatory Visit: Payer: Self-pay | Admitting: Family Medicine

## 2016-03-31 ENCOUNTER — Ambulatory Visit (INDEPENDENT_AMBULATORY_CARE_PROVIDER_SITE_OTHER): Payer: Medicare HMO | Admitting: Family Medicine

## 2016-03-31 ENCOUNTER — Encounter: Payer: Self-pay | Admitting: Family Medicine

## 2016-03-31 VITALS — BP 106/62 | HR 72 | Temp 98.2°F | Resp 16 | Ht 64.0 in | Wt 134.0 lb

## 2016-03-31 DIAGNOSIS — IMO0002 Reserved for concepts with insufficient information to code with codable children: Secondary | ICD-10-CM

## 2016-03-31 DIAGNOSIS — N3090 Cystitis, unspecified without hematuria: Secondary | ICD-10-CM

## 2016-03-31 DIAGNOSIS — N8110 Cystocele, unspecified: Secondary | ICD-10-CM

## 2016-03-31 LAB — POCT URINALYSIS DIPSTICK
BILIRUBIN UA: NEGATIVE
GLUCOSE UA: NEGATIVE
Ketones, UA: NEGATIVE
NITRITE UA: NEGATIVE
PH UA: 6
SPEC GRAV UA: 1.02
UROBILINOGEN UA: 0.2

## 2016-03-31 MED ORDER — NITROFURANTOIN MONOHYD MACRO 100 MG PO CAPS: 100.0000 mg | ORAL_CAPSULE | Freq: Two times a day (BID) | ORAL | Status: DC

## 2016-03-31 MED ORDER — PHENAZOPYRIDINE HCL 200 MG PO TABS: 200.0000 mg | ORAL_TABLET | Freq: Three times a day (TID) | ORAL | Status: DC | PRN

## 2016-03-31 NOTE — Patient Instructions (Signed)

## 2016-03-31 NOTE — Progress Notes (Signed)
Patient: Kristina Jordan Female    DOB: 10/17/1950   65 y.o.   MRN: [id number] Visit Date: 03/31/2016  Today's Provider: Vernie Murders, PA   Chief Complaint  Patient presents with  . Urinary Frequency   Subjective:    Urinary Frequency  This is a new problem. The current episode started yesterday. The problem occurs intermittently. The problem has been unchanged. There has been no fever. Associated symptoms include a discharge, frequency and urgency. Pertinent negatives include no hematuria. Associated symptoms comments: Lower abdominal pain. . She has tried nothing for the symptoms.    Past Medical History  Diagnosis Date  . MITRAL VALVE PROLAPSE   . MITRAL INSUFFICIENCY   . FIBRILLATION, ATRIAL   . CARDIOMYOPATHY   . Atrial septal aneurysm    Past Surgical History  Procedure Laterality Date  . Abdominal hysterectomy    . Cervical spine surgery    . Left heart catheterization with coronary angiogram N/A 09/10/2011    Procedure: LEFT HEART CATHETERIZATION WITH CORONARY ANGIOGRAM;  Surgeon: Wellington Hampshire, MD;  Location: [facility];  Service: Cardiovascular;  Laterality: N/A;   Family History  Problem Relation Age of Onset  . Coronary artery disease    . Thyroid disease    . Multiple sclerosis Sister   . Heart attack Mother     Allergies  Allergen Reactions  . Sulfonamide Derivatives Rash   Current Meds  Medication Sig  . alendronate (FOSAMAX) 70 MG tablet Take 1 tablet (70 mg total) by mouth every 7 (seven) days. Take with a full glass of water on an empty stomach. Don't lie down until after eating breakfast.  . aspirin 81 MG tablet Take 81 mg by mouth daily.  . Calcium Carb-Cholecalciferol (CALCIUM 600 + D PO) Take by mouth daily.  . Cholecalciferol (VITAMIN D-3) 1000 UNITS CAPS Take 2,000 Units by mouth once.  . diltiazem (CARDIZEM) 120 MG tablet TAKE ONE TABLET BY MOUTH ONCE DAILY  . propranolol (INDERAL) 40 MG tablet TAKE ONE TABLET BY MOUTH TWICE  DAILY    Review of Systems  Constitutional: Negative.   Genitourinary: Positive for dysuria, urgency, frequency and vaginal discharge. Negative for hematuria.  Skin: Negative.     Social History  Substance Use Topics  . Smoking status: Former Research scientist (life sciences)  . Smokeless tobacco: Not on file  . Alcohol Use: No   Objective:   BP 106/62 mmHg  Pulse 72  Temp(Src) 98.2 F (36.8 C)  Resp 16  Ht 5\' 4"  (1.626 m)  Wt 134 lb (60.782 kg)  BMI 22.99 kg/m2  Physical Exam  Constitutional: She is oriented to person, place, and time. She appears well-developed and well-nourished. No distress.  HENT:  Head: Normocephalic and atraumatic.  Right Ear: Hearing normal.  Left Ear: Hearing normal.  Nose: Nose normal.  Eyes: Conjunctivae and lids are normal. Right eye exhibits no discharge. Left eye exhibits no discharge. No scleral icterus.  Neck: Neck supple.  Cardiovascular: Normal rate and regular rhythm.   Pulmonary/Chest: Effort normal. No respiratory distress.  Abdominal: Soft. Bowel sounds are normal. There is tenderness.  Suprapubic tenderness to palpation.  Genitourinary: Vagina normal.  Moderate cystocele. No discharge.  Musculoskeletal: Normal range of motion.  Neurological: She is alert and oriented to person, place, and time.  Skin: Skin is intact. No lesion and no rash noted.  Psychiatric: She has a normal mood and affect. Her speech is normal and behavior is normal. Thought content normal.  Assessment & Plan:     1. Cystitis Urinary frequency, urgency and bladder spasms started yesterday. No hematuria or fever. Urinalysis showed TNTC WBC's per hpf. Will get urine C&S and start Macrobid. Given Pyridium for spasms. Increase fluid intake and recheck pending culture report. - Urine culture - POCT urinalysis dipstick - nitrofurantoin, macrocrystal-monohydrate, (MACROBID) 100 MG capsule; Take 1 capsule (100 mg total) by mouth 2 (two) times daily.  Dispense: 20 capsule; Refill: 0 -  phenazopyridine (PYRIDIUM) 200 MG tablet; Take 1 tablet (200 mg total) by mouth 3 (three) times daily as needed for pain.  Dispense: 10 tablet; Refill: 0  2. Cystocele Moderate drop in bladder. No irritation of vaginal discharge today. Urinalysis shows pyuria. Will treat acute cystitis per above. - POCT urinalysis dipstick        Vernie Murders, PA  [facility]

## 2016-04-02 LAB — URINE CULTURE

## 2016-04-03 ENCOUNTER — Telehealth: Payer: Self-pay

## 2016-04-03 NOTE — Telephone Encounter (Signed)
Advised patient of results.  

## 2016-04-03 NOTE — Telephone Encounter (Signed)
-----   Message from Margo Common, [place] sent at 04/03/2016 12:36 PM EDT ----- Culture confirms E.coli bacterial infection in urine. It is susceptible to the Macrobid (nitrofurantoin). Finish all the antibiotic and drink extra fluids to flush out infection. Recheck urinalysis in 10 days if any symptoms remain.

## 2016-04-03 NOTE — Telephone Encounter (Signed)
Left message to call back  

## 2016-04-21 ENCOUNTER — Other Ambulatory Visit: Payer: Self-pay | Admitting: Family Medicine

## 2016-04-22 ENCOUNTER — Other Ambulatory Visit: Payer: Self-pay | Admitting: Family Medicine

## 2016-05-20 ENCOUNTER — Other Ambulatory Visit: Payer: Self-pay | Admitting: Family Medicine

## 2016-06-03 DIAGNOSIS — I729 Aneurysm of unspecified site: Secondary | ICD-10-CM | POA: Diagnosis not present

## 2016-06-03 DIAGNOSIS — I482 Chronic atrial fibrillation: Secondary | ICD-10-CM | POA: Diagnosis not present

## 2016-06-03 DIAGNOSIS — I252 Old myocardial infarction: Secondary | ICD-10-CM | POA: Diagnosis not present

## 2016-06-03 DIAGNOSIS — Z0000 Encounter for general adult medical examination without abnormal findings: Secondary | ICD-10-CM | POA: Diagnosis not present

## 2016-06-03 DIAGNOSIS — M810 Age-related osteoporosis without current pathological fracture: Secondary | ICD-10-CM | POA: Diagnosis not present

## 2016-06-09 DIAGNOSIS — R69 Illness, unspecified: Secondary | ICD-10-CM | POA: Diagnosis not present

## 2016-06-30 ENCOUNTER — Other Ambulatory Visit: Payer: Self-pay | Admitting: Family Medicine

## 2016-06-30 DIAGNOSIS — Z1231 Encounter for screening mammogram for malignant neoplasm of breast: Secondary | ICD-10-CM

## 2016-07-17 ENCOUNTER — Other Ambulatory Visit: Payer: Self-pay

## 2016-07-17 MED ORDER — ALENDRONATE SODIUM 70 MG PO TABS
70.0000 mg | ORAL_TABLET | ORAL | 3 refills | Status: DC
Start: 2016-07-17 — End: 2017-06-09

## 2016-07-17 NOTE — Telephone Encounter (Signed)
Refill request received from [facility] requesting alendronate (FOSAMAX) 70 MG tablet

## 2016-07-18 ENCOUNTER — Ambulatory Visit (INDEPENDENT_AMBULATORY_CARE_PROVIDER_SITE_OTHER): Payer: Medicare HMO

## 2016-07-18 DIAGNOSIS — Z23 Encounter for immunization: Secondary | ICD-10-CM

## 2016-08-11 ENCOUNTER — Ambulatory Visit
Admission: RE | Admit: 2016-08-11 | Discharge: 2016-08-11 | Disposition: A | Discharge status: Home / Self Care | Payer: Medicare HMO | Source: Ambulatory Visit | Attending: Family Medicine | Admitting: Family Medicine

## 2016-08-11 ENCOUNTER — Telehealth: Payer: Self-pay

## 2016-08-11 DIAGNOSIS — Z1231 Encounter for screening mammogram for malignant neoplasm of breast: Secondary | ICD-10-CM | POA: Insufficient documentation

## 2016-08-11 NOTE — Telephone Encounter (Signed)
-----   Message from Margo Common, [place] sent at 08/11/2016  3:43 PM EST ----- Normal mammograms without evidence of malignancy. Radiologist recommends repeat screening in a year.

## 2016-08-11 NOTE — Telephone Encounter (Signed)
Patient has been advised. KW 

## 2016-08-19 ENCOUNTER — Other Ambulatory Visit: Payer: Self-pay | Admitting: Family Medicine

## 2016-08-25 ENCOUNTER — Other Ambulatory Visit: Payer: Self-pay | Admitting: Family Medicine

## 2016-08-25 NOTE — Telephone Encounter (Signed)
Per [place] they called [facility] and got the RX transferred. Patient has already picked up RX.

## 2016-08-25 NOTE — Addendum Note (Signed)
Addended by: Jules Schick on: 08/25/2016 10:53 AM   Modules accepted: Orders

## 2016-08-25 NOTE — Telephone Encounter (Signed)
Pt needs refill on her   propranolol (INDERAL) 40 MG tablet but she said the pharmacy said it was on back order.  She uses [facility]  She said she could use the [facility] on [address].  Can we check withthem and see if they have the RX.  Her call back is [phone]  Thanks, Con Memos

## 2016-08-25 NOTE — Telephone Encounter (Signed)
Ask [place] if this patient has refills available for the Propranolol and is it unavailable due to back order.

## 2016-09-25 ENCOUNTER — Other Ambulatory Visit: Payer: Self-pay | Admitting: Family Medicine

## 2016-10-16 DIAGNOSIS — M810 Age-related osteoporosis without current pathological fracture: Secondary | ICD-10-CM | POA: Diagnosis not present

## 2016-10-16 DIAGNOSIS — Z961 Presence of intraocular lens: Secondary | ICD-10-CM | POA: Diagnosis not present

## 2016-10-16 DIAGNOSIS — Z7982 Long term (current) use of aspirin: Secondary | ICD-10-CM | POA: Diagnosis not present

## 2016-10-16 DIAGNOSIS — Z0000 Encounter for general adult medical examination without abnormal findings: Secondary | ICD-10-CM | POA: Diagnosis not present

## 2016-10-16 DIAGNOSIS — Z6822 Body mass index (BMI) 22.0-22.9, adult: Secondary | ICD-10-CM | POA: Diagnosis not present

## 2016-10-16 DIAGNOSIS — Z9849 Cataract extraction status, unspecified eye: Secondary | ICD-10-CM | POA: Diagnosis not present

## 2016-10-16 DIAGNOSIS — I482 Chronic atrial fibrillation: Secondary | ICD-10-CM | POA: Diagnosis not present

## 2016-10-16 DIAGNOSIS — Z7983 Long term (current) use of bisphosphonates: Secondary | ICD-10-CM | POA: Diagnosis not present

## 2016-10-16 DIAGNOSIS — I349 Nonrheumatic mitral valve disorder, unspecified: Secondary | ICD-10-CM | POA: Diagnosis not present

## 2016-10-16 DIAGNOSIS — I252 Old myocardial infarction: Secondary | ICD-10-CM | POA: Diagnosis not present

## 2016-10-24 ENCOUNTER — Ambulatory Visit: Payer: Medicare HMO

## 2016-10-28 ENCOUNTER — Ambulatory Visit: Payer: Medicare HMO | Admitting: Family Medicine

## 2016-10-28 ENCOUNTER — Ambulatory Visit (INDEPENDENT_AMBULATORY_CARE_PROVIDER_SITE_OTHER): Payer: Medicare HMO | Admitting: Family Medicine

## 2016-10-28 ENCOUNTER — Encounter: Payer: Self-pay | Admitting: Family Medicine

## 2016-10-28 VITALS — BP 98/64 | HR 64 | Temp 97.7°F | Resp 16 | Wt 133.0 lb

## 2016-10-28 DIAGNOSIS — R42 Dizziness and giddiness: Secondary | ICD-10-CM

## 2016-10-28 MED ORDER — MECLIZINE HCL 25 MG PO TABS: 25.0000 mg | ORAL_TABLET | Freq: Three times a day (TID) | ORAL | 0 refills | Status: DC | PRN

## 2016-10-28 MED ORDER — ONDANSETRON HCL 4 MG PO TABS: 4.0000 mg | ORAL_TABLET | Freq: Three times a day (TID) | ORAL | 0 refills | Status: DC | PRN

## 2016-10-28 NOTE — Progress Notes (Signed)
Subjective:     Patient ID: Kristina Jordan, female   DOB: 09/01/1951, 66 y.o.   MRN: [id number]  HPI  Chief Complaint  Patient presents with  . Dizziness    Patient reports she has had dizziness and lightheadness since early this morning. Patient also mentions that she has vomited 5 times this morning.   Reports vertigo after she got up at 5 AM which has been intermittent until office visit at 10:30. Not sure if it is triggered by changes in position. Accompanied by her husband today.  Review of Systems  Respiratory: Negative for shortness of breath.   Cardiovascular: Negative for chest pain.       Objective:   Physical Exam  Constitutional: She appears well-developed and well-nourished. No distress.  HENT:  Right Ear: Tympanic membrane normal.  Left Ear: Tympanic membrane normal.  Eyes: EOM are normal. Pupils are equal, round, and reactive to light.  Neck: Carotid bruit is not present.  Cardiovascular: Normal rate and regular rhythm.   Pulmonary/Chest: Breath sounds normal.  Neurological: Coordination (Finger to Nose WNL;  Heel to toe WNL, Romberg negative) normal.       Assessment:    1. Vertigo - meclizine (ANTIVERT) 25 MG tablet; Take 1 tablet (25 mg total) by mouth 3 (three) times daily as needed for dizziness.  Dispense: 21 tablet; Refill: 0 - ondansetron (ZOFRAN) 4 MG tablet; Take 1 tablet (4 mg total) by mouth every 8 (eight) hours as needed for nausea or vomiting.  Dispense: 20 tablet; Refill: 0    Plan:    Call for new symptoms or not improving.

## 2016-10-28 NOTE — Patient Instructions (Signed)
Let us know if you are not improving or have new symptoms

## 2016-11-06 DIAGNOSIS — H2513 Age-related nuclear cataract, bilateral: Secondary | ICD-10-CM | POA: Diagnosis not present

## 2016-11-06 DIAGNOSIS — H35033 Hypertensive retinopathy, bilateral: Secondary | ICD-10-CM | POA: Diagnosis not present

## 2016-11-06 DIAGNOSIS — H40013 Open angle with borderline findings, low risk, bilateral: Secondary | ICD-10-CM | POA: Diagnosis not present

## 2016-11-07 ENCOUNTER — Ambulatory Visit (INDEPENDENT_AMBULATORY_CARE_PROVIDER_SITE_OTHER): Payer: Medicare HMO

## 2016-11-07 VITALS — BP 102/66 | HR 72 | Temp 97.6°F | Ht 64.0 in | Wt 131.6 lb

## 2016-11-07 DIAGNOSIS — Z0000 Encounter for general adult medical examination without abnormal findings: Secondary | ICD-10-CM | POA: Diagnosis not present

## 2016-11-07 DIAGNOSIS — Z23 Encounter for immunization: Secondary | ICD-10-CM

## 2016-11-07 NOTE — Progress Notes (Signed)
Subjective:   Kristina Jordan is a 66 y.o. female who presents for Medicare Annual (Subsequent) preventive examination.  Review of Systems:  N/A  Cardiac Risk Factors include: advanced age (>33men, >71 women);dyslipidemia     Objective:     Vitals: BP 102/66 (BP Location: Right Arm)   Pulse 72   Temp 97.6 F (36.4 C) (Oral)   Ht 5\' 4"  (1.626 m)   Wt 131 lb 9.6 oz (59.7 kg)   BMI 22.59 kg/m   Body mass index is 22.59 kg/m.   Tobacco History  Smoking Status  . Former Smoker  . Types: Cigarettes  Smokeless Tobacco  . Never Used    Comment: only socially     Counseling given: Not Answered   Past Medical History:  Diagnosis Date  . Atrial septal aneurysm   . CARDIOMYOPATHY   . FIBRILLATION, ATRIAL   . MITRAL INSUFFICIENCY   . MITRAL VALVE PROLAPSE    Past Surgical History:  Procedure Laterality Date  . ABDOMINAL HYSTERECTOMY    . BREAST BIOPSY Left    neg  . BREAST BIOPSY Left    stereo- neg  . CERVICAL SPINE SURGERY    . LEFT HEART CATHETERIZATION WITH CORONARY ANGIOGRAM N/A 09/10/2011   Procedure: LEFT HEART CATHETERIZATION WITH CORONARY ANGIOGRAM;  Surgeon: Wellington Hampshire, MD;  Location: [facility];  Service: Cardiovascular;  Laterality: N/A;   Family History  Problem Relation Age of Onset  . Multiple sclerosis Sister   . Cancer Brother     bladder  . Heart attack Mother   . Cancer Sister     bladder  . Coronary artery disease    . Thyroid disease    . Breast cancer Neg Hx    History  Sexual Activity  . Sexual activity: Yes    Outpatient Encounter Prescriptions as of 11/07/2016  Medication Sig  . alendronate (FOSAMAX) 70 MG tablet Take 1 tablet (70 mg total) by mouth every 7 (seven) days. Take with a full glass of water on an empty stomach. Don't lie down until after eating breakfast.  . aspirin 81 MG tablet Take 81 mg by mouth daily.  . Calcium Carb-Cholecalciferol (CALCIUM 600 + D PO) Take 2 capsules by mouth daily.   . Cholecalciferol  (VITAMIN D-3) 1000 UNITS CAPS Take 2,000 Units by mouth once.  . diltiazem (CARDIZEM) 120 MG tablet TAKE ONE TABLET BY MOUTH ONCE DAILY  . meclizine (ANTIVERT) 25 MG tablet Take 1 tablet (25 mg total) by mouth 3 (three) times daily as needed for dizziness.  . ondansetron (ZOFRAN) 4 MG tablet Take 1 tablet (4 mg total) by mouth every 8 (eight) hours as needed for nausea or vomiting.  . propranolol (INDERAL) 40 MG tablet TAKE ONE TABLET BY MOUTH TWICE DAILY  . naproxen (NAPROSYN) 500 MG tablet Take 1 tablet (500 mg total) by mouth 2 (two) times daily with a meal. (Patient not taking: Reported on 03/31/2016)  . nitrofurantoin, macrocrystal-monohydrate, (MACROBID) 100 MG capsule Take 1 capsule (100 mg total) by mouth 2 (two) times daily. (Patient not taking: Reported on 10/28/2016)  . phenazopyridine (PYRIDIUM) 200 MG tablet Take 1 tablet (200 mg total) by mouth 3 (three) times daily as needed for pain. (Patient not taking: Reported on 10/28/2016)   No facility-administered encounter medications on file as of 11/07/2016.     Activities of Daily Living In your present state of health, do you have any difficulty performing the following activities: 11/07/2016 11/19/2015  Hearing?  Tempie Donning  Vision? N N  Difficulty concentrating or making decisions? N N  Walking or climbing stairs? N N  Dressing or bathing? N N  Doing errands, shopping? N N  Preparing Food and eating ? N -  Using the Toilet? N -  In the past six months, have you accidently leaked urine? Y -  Do you have problems with loss of bowel control? N -  Managing your Medications? N -  Managing your Finances? N -  Housekeeping or managing your Housekeeping? N -  Some recent data might be hidden    Patient Care Team: Margo Common, PA as PCP - General (Family Medicine) Minus Breeding, MD as Consulting Physician (Cardiology) Carmon Ginsberg, PA as Referring Physician (Family Medicine)    Assessment:     Exercise Activities and Dietary  recommendations Current Exercise Habits: Home exercise routine, Type of exercise: Other - see comments;walking (bike), Time (Minutes): 30, Frequency (Times/Week): 3, Weekly Exercise (Minutes/Week): 90, Intensity: Mild, Exercise limited by: None identified  Goals    . Increase water intake          Starting 11/07/16, I will increase my water intake to 3-4 glasses a day.      Fall Risk Fall Risk  11/07/2016 11/19/2015  Falls in the past year? No No   Depression Screen PHQ 2/9 Scores 11/07/2016 11/19/2015  PHQ - 2 Score 0 0     Cognitive Function     6CIT Screen 11/07/2016  What Year? 0 points  What month? 0 points  What time? 0 points  Count back from 20 0 points  Months in reverse 0 points  Repeat phrase 4 points  Total Score 4    Immunization History  Administered Date(s) Administered  . Influenza, High Dose Seasonal PF 08/02/2015, 07/18/2016  . Pneumococcal Conjugate-13 11/17/2014  . Pneumococcal Polysaccharide-23 06/02/2011  . Tdap 10/21/2010  . Zoster 10/27/2011   Screening Tests Health Maintenance  Topic Date Due  . COLONOSCOPY  05/18/2017  . MAMMOGRAM  08/11/2018  . TETANUS/TDAP  10/21/2020  . INFLUENZA VACCINE  Completed  . DEXA SCAN  Completed  . Hepatitis C Screening  Completed  . PNA vac Low Risk Adult  Completed      Plan:  I have personally reviewed and addressed the Medicare Annual Wellness questionnaire and have noted the following in the patient's chart:  A. Medical and social history B. Use of alcohol, tobacco or illicit drugs  C. Current medications and supplements D. Functional ability and status E.  Nutritional status F.  Physical activity G. Advance directives H. List of other physicians I.  Hospitalizations, surgeries, and ER visits in previous 12 months J.  [place] such as hearing and vision if needed, cognitive and depression L. Referrals and appointments - none  In addition, I have reviewed and discussed with patient  certain preventive protocols, quality metrics, and best practice recommendations. A written personalized care plan for preventive services as well as general preventive health recommendations were provided to patient.  See attached scanned questionnaire for additional information.   Signed,  Fabio Neighbors, LPN Nurse Health Advisor   MD Recommendations:None  Reviewed the Health Advisor's note and agree with documentation and plan.

## 2016-11-07 NOTE — Patient Instructions (Signed)
Health Maintenance, Female Introduction Adopting a healthy lifestyle and getting preventive care can go a long way to promote health and wellness. Talk with your health care provider about what schedule of regular examinations is right for you. This is a good chance for you to check in with your provider about disease prevention and staying healthy. In between checkups, there are plenty of things you can do on your own. Experts have done a lot of research about which lifestyle changes and preventive measures are most likely to keep you healthy. Ask your health care provider for more information. Weight and diet Eat a healthy diet  Be sure to include plenty of vegetables, fruits, low-fat dairy products, and lean protein.  Do not eat a lot of foods high in solid fats, added sugars, or salt.  Get regular exercise. This is one of the most important things you can do for your health.  Most adults should exercise for at least 150 minutes each week. The exercise should increase your heart rate and make you sweat (moderate-intensity exercise).  Most adults should also do strengthening exercises at least twice a week. This is in addition to the moderate-intensity exercise. Maintain a healthy weight  Body mass index (BMI) is a measurement that can be used to identify possible weight problems. It estimates body fat based on height and weight. Your health care provider can help determine your BMI and help you achieve or maintain a healthy weight.  For females 4 years of age and older:  A BMI below 18.5 is considered underweight.  A BMI of 18.5 to 24.9 is normal.  A BMI of 25 to 29.9 is considered overweight.  A BMI of 30 and above is considered obese. Watch levels of cholesterol and blood lipids  You should start having your blood tested for lipids and cholesterol at 68 years of age, then have this test every 5 years.  You may need to have your cholesterol levels checked more often  if:  Your lipid or cholesterol levels are high.  You are older than 68 years of age.  You are at high risk for heart disease. Cancer screening Lung Cancer  Lung cancer screening is recommended for adults 12-31 years old who are at high risk for lung cancer because of a history of smoking.  A yearly low-dose CT scan of the lungs is recommended for people who:  Currently smoke.  Have quit within the past 15 years.  Have at least a 30-pack-year history of smoking. A pack year is smoking an average of one pack of cigarettes a day for 1 year.  Yearly screening should continue until it has been 15 years since you quit.  Yearly screening should stop if you develop a health problem that would prevent you from having lung cancer treatment. Breast Cancer  Practice breast self-awareness. This means understanding how your breasts normally appear and feel.  It also means doing regular breast self-exams. Let your health care provider know about any changes, no matter how small.  If you are in your 20s or 30s, you should have a clinical breast exam (CBE) by a health care provider every 1-3 years as part of a regular health exam.  If you are 74 or older, have a CBE every year. Also consider having a breast X-ray (mammogram) every year.  If you have a family history of breast cancer, talk to your health care provider about genetic screening.  If you are at high risk for breast cancer,  talk to your health care provider about having an MRI and a mammogram every year.  Breast cancer gene (BRCA) assessment is recommended for women who have family members with BRCA-related cancers. BRCA-related cancers include:  Breast.  Ovarian.  Tubal.  Peritoneal cancers.  Results of the assessment will determine the need for genetic counseling and BRCA1 and BRCA2 testing. Colorectal Cancer  This type of cancer can be detected and often prevented.  Routine colorectal cancer screening usually begins  at 68 years of age and continues through 68 years of age.  Your health care provider may recommend screening at an earlier age if you have risk factors for colon cancer.  Your health care provider may also recommend using home test kits to check for hidden blood in the stool.  A small camera at the end of a tube can be used to examine your colon directly (sigmoidoscopy or colonoscopy). This is done to check for the earliest forms of colorectal cancer.  Routine screening usually begins at age 50.  Direct examination of the colon should be repeated every 5-10 years through 68 years of age. However, you may need to be screened more often if early forms of precancerous polyps or small growths are found. Skin Cancer  Check your skin from head to toe regularly.  Tell your health care provider about any new moles or changes in moles, especially if there is a change in a mole's shape or color.  Also tell your health care provider if you have a mole that is larger than the size of a pencil eraser.  Always use sunscreen. Apply sunscreen liberally and repeatedly throughout the day.  Protect yourself by wearing long sleeves, pants, a wide-brimmed hat, and sunglasses whenever you are outside. Heart disease, diabetes, and high blood pressure  High blood pressure causes heart disease and increases the risk of stroke. High blood pressure is more likely to develop in:  People who have blood pressure in the high end of the normal range (130-139/85-89 mm Hg).  People who are overweight or obese.  People who are African American.  If you are 18-39 years of age, have your blood pressure checked every 3-5 years. If you are 40 years of age or older, have your blood pressure checked every year. You should have your blood pressure measured twice-once when you are at a hospital or clinic, and once when you are not at a hospital or clinic. Record the average of the two measurements. To check your blood pressure  when you are not at a hospital or clinic, you can use:  An automated blood pressure machine at a pharmacy.  A home blood pressure monitor.  If you are between 55 years and 79 years old, ask your health care provider if you should take aspirin to prevent strokes.  Have regular diabetes screenings. This involves taking a blood sample to check your fasting blood sugar level.  If you are at a normal weight and have a low risk for diabetes, have this test once every three years after 68 years of age.  If you are overweight and have a high risk for diabetes, consider being tested at a younger age or more often. Preventing infection Hepatitis B  If you have a higher risk for hepatitis B, you should be screened for this virus. You are considered at high risk for hepatitis B if:  You were born in a country where hepatitis B is common. Ask your health care provider which countries are   considered high risk.  Your parents were born in a high-risk country, and you have not been immunized against hepatitis B (hepatitis B vaccine).  You have HIV or AIDS.  You use needles to inject street drugs.  You live with someone who has hepatitis B.  You have had sex with someone who has hepatitis B.  You get hemodialysis treatment.  You take certain medicines for conditions, including cancer, organ transplantation, and autoimmune conditions. Hepatitis C  Blood testing is recommended for:  Everyone born from 1945 through 1965.  Anyone with known risk factors for hepatitis C. Osteoporosis and menopause  Osteoporosis is a disease in which the bones lose minerals and strength with aging. This can result in serious bone fractures. Your risk for osteoporosis can be identified using a bone density scan.  If you are 65 years of age or older, or if you are at risk for osteoporosis and fractures, ask your health care provider if you should be screened.  Ask your health care provider whether you should take  a calcium or vitamin D supplement to lower your risk for osteoporosis.  Menopause may have certain physical symptoms and risks.  Hormone replacement therapy may reduce some of these symptoms and risks. Talk to your health care provider about whether hormone replacement therapy is right for you. Follow these instructions at home:  Schedule regular health, dental, and eye exams.  Stay current with your immunizations.  Do not use any tobacco products including cigarettes, chewing tobacco, or electronic cigarettes.  If you are pregnant, do not drink alcohol.  If you are breastfeeding, limit how much and how often you drink alcohol.  Limit alcohol intake to no more than 1 drink per day for nonpregnant women. One drink equals 12 ounces of beer, 5 ounces of wine, or 1 ounces of hard liquor.  Do not use street drugs.  Do not share needles.  Ask your health care provider for help if you need support or information about quitting drugs.  Tell your health care provider if you often feel depressed.  Tell your health care provider if you have ever been abused or do not feel safe at home. This information is not intended to replace advice given to you by your health care provider. Make sure you discuss any questions you have with your health care provider. Document Released: 03/17/2011 Document Revised: 02/07/2016 Document Reviewed: 06/05/2015  2017 [employer]vier  

## 2016-11-20 ENCOUNTER — Ambulatory Visit (INDEPENDENT_AMBULATORY_CARE_PROVIDER_SITE_OTHER): Payer: Medicare HMO | Admitting: Family Medicine

## 2016-11-20 ENCOUNTER — Encounter: Payer: Self-pay | Admitting: Family Medicine

## 2016-11-20 VITALS — BP 102/60 | HR 64 | Temp 97.9°F | Resp 14 | Wt 133.2 lb

## 2016-11-20 DIAGNOSIS — E559 Vitamin D deficiency, unspecified: Secondary | ICD-10-CM

## 2016-11-20 DIAGNOSIS — M810 Age-related osteoporosis without current pathological fracture: Secondary | ICD-10-CM

## 2016-11-20 DIAGNOSIS — R195 Other fecal abnormalities: Secondary | ICD-10-CM

## 2016-11-20 DIAGNOSIS — I059 Rheumatic mitral valve disease, unspecified: Secondary | ICD-10-CM

## 2016-11-20 DIAGNOSIS — E7800 Pure hypercholesterolemia, unspecified: Secondary | ICD-10-CM

## 2016-11-20 MED ORDER — PROPRANOLOL HCL 40 MG PO TABS: 40.0000 mg | ORAL_TABLET | Freq: Two times a day (BID) | ORAL | 3 refills | Status: DC

## 2016-11-20 NOTE — Progress Notes (Deleted)
   Patient: Kristina Jordan Female    DOB: Jun 22, 1951   66 y.o.   MRN: [id number] Visit Date: 11/20/2016  Today's Provider: Vernie Murders, PA   No chief complaint on file.  Subjective:    HPI Patient is here to follow up from [facility] on 11/07/2016.    Previous Medications   ALENDRONATE (FOSAMAX) 70 MG TABLET    Take 1 tablet (70 mg total) by mouth every 7 (seven) days. Take with a full glass of water on an empty stomach. Don't lie down until after eating breakfast.   ASPIRIN 81 MG TABLET    Take 81 mg by mouth daily.   CALCIUM CARB-CHOLECALCIFEROL (CALCIUM 600 + D PO)    Take 2 capsules by mouth daily.    CHOLECALCIFEROL (VITAMIN D-3) 1000 UNITS CAPS    Take 2,000 Units by mouth once.   DILTIAZEM (CARDIZEM) 120 MG TABLET    TAKE ONE TABLET BY MOUTH ONCE DAILY   MECLIZINE (ANTIVERT) 25 MG TABLET    Take 1 tablet (25 mg total) by mouth 3 (three) times daily as needed for dizziness.   NAPROXEN (NAPROSYN) 500 MG TABLET    Take 1 tablet (500 mg total) by mouth 2 (two) times daily with a meal.   NITROFURANTOIN, MACROCRYSTAL-MONOHYDRATE, (MACROBID) 100 MG CAPSULE    Take 1 capsule (100 mg total) by mouth 2 (two) times daily.   ONDANSETRON (ZOFRAN) 4 MG TABLET    Take 1 tablet (4 mg total) by mouth every 8 (eight) hours as needed for nausea or vomiting.   PHENAZOPYRIDINE (PYRIDIUM) 200 MG TABLET    Take 1 tablet (200 mg total) by mouth 3 (three) times daily as needed for pain.   PROPRANOLOL (INDERAL) 40 MG TABLET    TAKE ONE TABLET BY MOUTH TWICE DAILY    Review of Systems  Social History  Substance Use Topics  . Smoking status: Former Smoker    Types: Cigarettes  . Smokeless tobacco: Never Used     Comment: only socially  . Alcohol use No   Objective:   There were no vitals taken for this visit.  Physical Exam      Assessment & Plan:       Follow up: No Follow-up on file.

## 2016-11-20 NOTE — Progress Notes (Signed)
Patient: Kristina Jordan, Female    DOB: [birth year], 66 y.o.   MRN: [id number] Visit Date: 11/20/2016  Today's Provider: Vernie Murders, PA   Chief Complaint  Patient presents with  . Annual Exam   Subjective:  Patient is here to follow up from [facility] on 11/07/2016.   Annual physical exam Kristina Jordan is a 66 y.o. female who presents today for health maintenance and complete physical. She feels well. She reports exercising 3 times per week. She reports she is sleeping fairly well.  -----------------------------------------------------------------   Review of Systems  Constitutional: Negative.   HENT: Negative.   Eyes: Negative.   Respiratory: Negative.   Cardiovascular: Negative.   Gastrointestinal: Negative.   Endocrine: Negative.   Genitourinary: Negative.   Musculoskeletal: Negative.   Skin: Negative.   Allergic/Immunologic: Negative.   Neurological: Negative.   Hematological: Negative.   Psychiatric/Behavioral: Negative.     Social History      She  reports that she has quit smoking. Her smoking use included Cigarettes. She has never used smokeless tobacco. She reports that she does not drink alcohol or use drugs.       Social History   Social History  . Marital status: Married    Spouse name: N/A  . Number of children: N/A  . Years of education: N/A   Social History Main Topics  . Smoking status: Former Smoker    Types: Cigarettes  . Smokeless tobacco: Never Used     Comment: only socially  . Alcohol use No  . Drug use: No  . Sexual activity: Yes   Other Topics Concern  . None   Social History Narrative  . None    Past Medical History:  Diagnosis Date  . Atrial septal aneurysm   . CARDIOMYOPATHY   . FIBRILLATION, ATRIAL   . MITRAL INSUFFICIENCY   . MITRAL VALVE PROLAPSE      Patient Active Problem List   Diagnosis Date Noted  . Hypercholesterolemia without hypertriglyceridemia 11/19/2015  . Avitaminosis D 11/19/2015  . Atypical  chest pain 09/09/2011  . Non-ST elevation MI (NSTEMI) ([place]) 09/09/2011  . 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 MITRAL INSUFFICIENCY 12/26/2009  . Mitral valve disorder 12/24/2009  . FIBRILLATION, ATRIAL 12/24/2009  . Heart disease 11/01/2009  . 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 Acquired trigger finger 01/02/2009  . Need for prophylactic hormone replacement therapy (postmenopausal) 10/09/2006  . OP (osteoporosis) 10/09/2006    Past Surgical History:  Procedure Laterality Date  . ABDOMINAL HYSTERECTOMY    . BREAST BIOPSY Left    neg  . BREAST BIOPSY Left    stereo- neg  . CERVICAL SPINE SURGERY    . LEFT HEART CATHETERIZATION WITH CORONARY ANGIOGRAM N/A 09/10/2011   Procedure: LEFT HEART CATHETERIZATION WITH CORONARY ANGIOGRAM;  Surgeon: Wellington Hampshire, MD;  Location: [facility];  Service: Cardiovascular;  Laterality: N/A;    Family History        Family Status  Relation Status  . Father Deceased  . Sister Deceased  . Brother Alive  . Mother Deceased  . Sister Alive  . Sister Alive  . Brother Alive  . Brother Alive  .    Marland Kitchen Neg Hx         Her family history includes Cancer in her brother and sister; Heart attack in her mother; Multiple sclerosis in her sister.     Allergies  Allergen Reactions  . Sulfonamide Derivatives Rash    Current  Outpatient Prescriptions:  .  alendronate (FOSAMAX) 70 MG tablet, Take 1 tablet (70 mg total) by mouth every 7 (seven) days. Take with a full glass of water on an empty stomach. Don't lie down until after eating breakfast., Disp: 12 tablet, Rfl: 3 .  aspirin 81 MG tablet, Take 81 mg by mouth daily., Disp: , Rfl:  .  Calcium Carb-Cholecalciferol (CALCIUM 600 + D PO), Take 2 capsules by mouth daily. , Disp: , Rfl:  .  Cholecalciferol (VITAMIN D-3) 1000 UNITS CAPS, Take 2,000 Units by mouth once., Disp: , Rfl:  .  diltiazem (CARDIZEM) 120 MG tablet, TAKE ONE TABLET BY MOUTH ONCE DAILY, Disp: 90 tablet, Rfl: 3 .  meclizine (ANTIVERT) 25 MG tablet,  Take 1 tablet (25 mg total) by mouth 3 (three) times daily as needed for dizziness., Disp: 21 tablet, Rfl: 0 .  ondansetron (ZOFRAN) 4 MG tablet, Take 1 tablet (4 mg total) by mouth every 8 (eight) hours as needed for nausea or vomiting., Disp: 20 tablet, Rfl: 0 .  propranolol (INDERAL) 40 MG tablet, TAKE ONE TABLET BY MOUTH TWICE DAILY, Disp: 60 tablet, Rfl: 3 .  naproxen (NAPROSYN) 500 MG tablet, Take 1 tablet (500 mg total) by mouth 2 (two) times daily with a meal. (Patient not taking: Reported on 03/31/2016), Disp: 60 tablet, Rfl: 0   Patient Care Team: Margo Common, PA as PCP - General (Family Medicine) Minus Breeding, MD as Consulting Physician (Cardiology) Carmon Ginsberg, [place] as Referring Physician (Family Medicine)      Objective:   Vitals: BP 102/60 (BP Location: Right Arm, Patient Position: Sitting, Cuff Size: Normal)   Pulse 64   Temp 97.9 F (36.6 C) (Oral)   Resp 14   Wt 133 lb 3.2 oz (60.4 kg)   SpO2 98%   BMI 22.86 kg/m   Wt Readings from Last 3 Encounters:  11/20/16 133 lb 3.2 oz (60.4 kg)  11/07/16 131 lb 9.6 oz (59.7 kg)  10/28/16 133 lb (60.3 kg)    Vitals:   11/20/16 1019  BP: 102/60  Pulse: 64  Resp: 14  Temp: 97.9 F (36.6 C)  TempSrc: Oral  SpO2: 98%  Weight: 133 lb 3.2 oz (60.4 kg)     Physical Exam  Constitutional: She is oriented to person, place, and time. She appears well-developed and well-nourished.  HENT:  Head: Normocephalic and atraumatic.  Right Ear: External ear normal.  Left Ear: External ear normal.  Nose: Nose normal.  Mouth/Throat: Oropharynx is clear and moist.  Eyes: Conjunctivae and EOM are normal. Pupils are equal, round, and reactive to light. Right eye exhibits no discharge.  Neck: Normal range of motion. Neck supple. No tracheal deviation present. No thyromegaly present.  Cardiovascular: Normal rate, regular rhythm, normal heart sounds and intact distal pulses.   No murmur heard. Pulmonary/Chest: Effort normal and  breath sounds normal. No respiratory distress. She has no wheezes. She has no rales. She exhibits no tenderness.  Abdominal: Soft. She exhibits no distension and no mass. There is no tenderness. There is no rebound and no guarding.  Genitourinary: Vagina normal. Rectal exam shows guaiac negative stool. No vaginal discharge found.  Musculoskeletal: Normal range of motion. She exhibits no edema or tenderness.  Lymphadenopathy:    She has no cervical adenopathy.  Neurological: She is alert and oriented to person, place, and time. She has normal reflexes. No cranial nerve deficit. She exhibits normal muscle tone. Coordination normal.  Skin: Skin is warm and dry. No  rash noted. No erythema. There is pallor.  Psychiatric: She has a normal mood and affect. Her behavior is normal. Judgment and thought content normal.   Depression Screen PHQ 2/9 Scores 11/07/2016 11/19/2015  PHQ - 2 Score 0 0   Assessment & Plan:     Routine Health Maintenance and Physical Exam  Exercise Activities and Dietary recommendations Goals    . Increase water intake          Starting 11/07/16, I will increase my water intake to 3-4 glasses a day.       Immunization History  Administered Date(s) Administered  . Influenza, High Dose Seasonal PF 07/31/2014, 08/02/2015, 07/18/2016  . Pneumococcal Conjugate-13 11/17/2014  . Pneumococcal Polysaccharide-23 06/02/2011, 11/07/2016  . Tdap 10/21/2010  . Zoster 10/27/2011    Health Maintenance  Topic Date Due  . COLONOSCOPY  05/18/2017  . MAMMOGRAM  08/11/2018  . TETANUS/TDAP  10/21/2020  . INFLUENZA VACCINE  Completed  . DEXA SCAN  Completed  . Hepatitis C Screening  Completed  . PNA vac Low Risk Adult  Completed     Discussed health benefits of physical activity, and encouraged her to engage in regular exercise appropriate for her age and condition.    -------------------------------------------------------------------- 1. Hypercholesterolemia without  hypertriglyceridemia Continues low fat diet and no weight gain. Total cholesterol was 169 with HDL 74, LDL 80 and triglycerides 76 last year on 11-30-15. Recheck blood levels with history of non--Q-wave MI in 2012 and follow up pending reports. - Comprehensive metabolic panel - Lipid panel - TSH  2. Occult blood in stools Pale thin individual with some blood noted in stools occasionally. Diagnosed with mild internal hemorrhoids on colonoscopy by Dr. Allen Norris (GI) 05-19-07. Will check labs for signs of anemia and schedule 10 repeat of colonoscopy. - CBC with Differential/Platelet - Comprehensive metabolic panel - Ambulatory referral to Gastroenterology  3. Avitaminosis D Still taking Calcium and Vitamin-D for osteoporosis. Very thin female and will check for anemia and Vitamin-D level. - CBC with Differential/Platelet - VITAMIN D 25 Hydroxy (Vit-D Deficiency, Fractures)   4. Osteoporosis, unspecified osteoporosis type, unspecified pathological fracture presence Tolerating Fosamax 70 mg q week. Last BMD on 12-12-15 confirmed osteoporosis. Will recheck labs and repeat BMD in a year. - CBC with Differential/Platelet - VITAMIN D 25 Hydroxy (Vit-D Deficiency, Fractures)  5. Mitral valve disorder Stable without palpitations of significance. Followed regularly by Dr. Percival Spanish (cardiologist) and continue Propranolol 40 mg BID. - propranolol (INDERAL) 40 MG tablet; Take 1 tablet (40 mg total) by mouth 2 (two) times daily.  Dispense: 180 tablet; Refill: [facility]

## 2016-11-21 ENCOUNTER — Telehealth: Payer: Self-pay | Admitting: Emergency Medicine

## 2016-11-21 LAB — CBC WITH DIFFERENTIAL/PLATELET
BASOS ABS: 0 10*3/uL (ref 0.0–0.2)
BASOS: 1 %
EOS (ABSOLUTE): 0.4 10*3/uL (ref 0.0–0.4)
Eos: 6 %
Hematocrit: 37.6 % (ref 34.0–46.6)
Hemoglobin: 12.5 g/dL (ref 11.1–15.9)
IMMATURE GRANS (ABS): 0 10*3/uL (ref 0.0–0.1)
IMMATURE GRANULOCYTES: 0 %
LYMPHS: 25 %
Lymphocytes Absolute: 1.6 10*3/uL (ref 0.7–3.1)
MCH: 27.9 pg (ref 26.6–33.0)
MCHC: 33.2 g/dL (ref 31.5–35.7)
MCV: 84 fL (ref 79–97)
Monocytes Absolute: 0.5 10*3/uL (ref 0.1–0.9)
Monocytes: 7 %
NEUTROS PCT: 61 %
Neutrophils Absolute: 3.9 10*3/uL (ref 1.4–7.0)
PLATELETS: 260 10*3/uL (ref 150–379)
RBC: 4.48 x10E6/uL (ref 3.77–5.28)
RDW: 14 % (ref 12.3–15.4)
WBC: 6.3 10*3/uL (ref 3.4–10.8)

## 2016-11-21 LAB — COMPREHENSIVE METABOLIC PANEL
ALT: 20 IU/L (ref 0–32)
AST: 26 IU/L (ref 0–40)
Albumin/Globulin Ratio: 1.5 (ref 1.2–2.2)
Albumin: 4.4 g/dL (ref 3.6–4.8)
Alkaline Phosphatase: 84 IU/L (ref 39–117)
BUN/Creatinine Ratio: 19 (ref 12–28)
BUN: 16 mg/dL (ref 8–27)
Bilirubin Total: 0.3 mg/dL (ref 0.0–1.2)
CALCIUM: 9.5 mg/dL (ref 8.7–10.3)
CO2: 24 mmol/L (ref 18–29)
Chloride: 102 mmol/L (ref 96–106)
Creatinine, Ser: 0.84 mg/dL (ref 0.57–1.00)
GFR, EST AFRICAN AMERICAN: 83 mL/min/{1.73_m2} (ref >59)
GFR, EST NON AFRICAN AMERICAN: 72 mL/min/{1.73_m2} (ref >59)
GLUCOSE: 91 mg/dL (ref 65–99)
Globulin, Total: 2.9 g/dL (ref 1.5–4.5)
Potassium: 4.2 mmol/L (ref 3.5–5.2)
Sodium: 142 mmol/L (ref 134–144)
TOTAL PROTEIN: 7.3 g/dL (ref 6.0–8.5)

## 2016-11-21 LAB — LIPID PANEL
CHOL/HDL RATIO: 2.5 ratio (ref 0.0–4.4)
Cholesterol, Total: 172 mg/dL (ref 100–199)
HDL: 69 mg/dL (ref >39)
LDL Calculated: 81 mg/dL (ref 0–99)
TRIGLYCERIDES: 109 mg/dL (ref 0–149)
VLDL CHOLESTEROL CAL: 22 mg/dL (ref 5–40)

## 2016-11-21 LAB — VITAMIN D 25 HYDROXY (VIT D DEFICIENCY, FRACTURES): Vit D, 25-Hydroxy: 38.5 ng/mL (ref 30.0–100.0)

## 2016-11-21 LAB — TSH: TSH: 1.1 u[IU]/mL (ref 0.450–4.500)

## 2016-11-21 NOTE — Telephone Encounter (Signed)
Pt advised-aa 

## 2016-11-21 NOTE — Telephone Encounter (Signed)
-----   Message from Margo Common, [place] sent at 11/21/2016  8:15 AM EST ----- All blood tests normal. Vitamin-D back to normal, also. Continue Vitamin-D 1000 IU qd to maintain blood level. Recheck annually.

## 2016-11-26 ENCOUNTER — Encounter: Payer: Self-pay | Admitting: Gastroenterology

## 2016-11-26 ENCOUNTER — Other Ambulatory Visit: Payer: Self-pay

## 2016-11-26 ENCOUNTER — Ambulatory Visit (INDEPENDENT_AMBULATORY_CARE_PROVIDER_SITE_OTHER): Payer: Medicare HMO | Admitting: Gastroenterology

## 2016-11-26 VITALS — BP 112/55 | HR 67 | Temp 97.7°F | Ht 64.0 in | Wt 132.5 lb

## 2016-11-26 DIAGNOSIS — K921 Melena: Secondary | ICD-10-CM

## 2016-11-26 NOTE — Progress Notes (Signed)
Gastroenterology Consultation  Referring Provider:     Margo Common, PA Primary Care Physician:  Vernie Murders, PA Primary Gastroenterologist:  Dr. Allen Norris     Reason for Consultation:     Heme-positive stools        HPI:   Kristina Jordan is a 66 y.o. y/o female referred for consultation & management of Heme-positive stools by Dr. Vernie Murders, PA.  This patient comes in today after being told that she had blood in her stools. The patient went for her regular physical exam and had a stool test done at that time per the patient states by the time she got home she had received a phone call from her primary care provider that her stools were positive for blood. The patient does report that she sees intermittent bright red blood. She also reports that she has no change in bowel habits but does have intermittent diarrhea and constipation with some mucus per rectum. There is no report of any unexplained weight loss, fevers, chills, nausea or vomiting. The patient reports that she had a colonoscopy 10 years ago and at that time states everything was fine.  Past Medical History:  Diagnosis Date  . Atrial septal aneurysm   . CARDIOMYOPATHY   . FIBRILLATION, ATRIAL   . MITRAL INSUFFICIENCY   . MITRAL VALVE PROLAPSE     Past Surgical History:  Procedure Laterality Date  . ABDOMINAL HYSTERECTOMY    . BREAST BIOPSY Left    neg  . BREAST BIOPSY Left    stereo- neg  . CERVICAL SPINE SURGERY    . LEFT HEART CATHETERIZATION WITH CORONARY ANGIOGRAM N/A 09/10/2011   Procedure: LEFT HEART CATHETERIZATION WITH CORONARY ANGIOGRAM;  Surgeon: Wellington Hampshire, MD;  Location: [facility];  Service: Cardiovascular;  Laterality: N/A;    Prior to Admission medications   Medication Sig Start Date End Date Taking? Authorizing Provider  alendronate (FOSAMAX) 70 MG tablet Take 1 tablet (70 mg total) by mouth every 7 (seven) days. Take with a full glass of water on an empty stomach. Don't lie down  until after eating breakfast. 07/17/16  Yes Vickki Muff Chrismon, PA  aspirin 81 MG tablet Take 81 mg by mouth daily.   Yes Historical Provider, MD  Calcium Carb-Cholecalciferol (CALCIUM 600 + D PO) Take 2 capsules by mouth daily.    Yes Historical Provider, MD  Cholecalciferol (VITAMIN D-3) 1000 UNITS CAPS Take 2,000 Units by mouth once.   Yes Historical Provider, MD  diltiazem (CARDIZEM) 120 MG tablet TAKE ONE TABLET BY MOUTH ONCE DAILY 08/19/16  Yes Vickki Muff Chrismon, PA  propranolol (INDERAL) 40 MG tablet Take 1 tablet (40 mg total) by mouth 2 (two) times daily. 11/20/16  Yes Dennis E Chrismon, PA  meclizine (ANTIVERT) 25 MG tablet Take 1 tablet (25 mg total) by mouth 3 (three) times daily as needed for dizziness. Patient not taking: Reported on 11/20/2016 10/28/16   Carmon Ginsberg, PA  naproxen (NAPROSYN) 500 MG tablet Take 1 tablet (500 mg total) by mouth 2 (two) times daily with a meal. Patient not taking: Reported on 03/31/2016 11/30/15   Vickki Muff Chrismon, PA  ondansetron (ZOFRAN) 4 MG tablet Take 1 tablet (4 mg total) by mouth every 8 (eight) hours as needed for nausea or vomiting. Patient not taking: Reported on 11/26/2016 10/28/16   Carmon Ginsberg, PA    Family History  Problem Relation Age of Onset  . Multiple sclerosis Sister   . Cancer Brother  bladder  . Heart attack Mother   . Cancer Sister     bladder  . Coronary artery disease    . Thyroid disease    . Breast cancer Neg Hx      Social History  Substance Use Topics  . Smoking status: Former Smoker    Types: Cigarettes  . Smokeless tobacco: Never Used     Comment: only socially  . Alcohol use No    Allergies as of 11/26/2016 - Review Complete 11/26/2016  Allergen Reaction Noted  . Sulfonamide derivatives Rash     Review of Systems:    All systems reviewed and negative except where noted in HPI.   Physical Exam:  BP (!) 112/55   Pulse 67   Temp 97.7 F (36.5 C) (Oral)   Ht 5\' 4"  (1.626 m)   Wt 132 lb 8 oz (60.1  kg)   BMI 22.74 kg/m  No LMP recorded. Patient has had a hysterectomy. Psych:  Alert and cooperative. Normal mood and affect. General:   Alert,  Well-developed, well-nourished, pleasant and cooperative in NAD Head:  Normocephalic and atraumatic. Eyes:  Sclera clear, no icterus.   Conjunctiva pink. Ears:  Normal auditory acuity. Nose:  No deformity, discharge, or lesions. Mouth:  No deformity or lesions,oropharynx pink & moist. Neck:  Supple; no masses or thyromegaly. Lungs:  Respirations even and unlabored.  Clear throughout to auscultation.   No wheezes, crackles, or rhonchi. No acute distress. Heart:  Regular rate and rhythm; no murmurs, clicks, rubs, or gallops. Abdomen:  Normal bowel sounds.  No bruits.  Soft, non-tender and non-distended without masses, hepatosplenomegaly or hernias noted.  No guarding or rebound tenderness.  Negative Carnett sign.   Rectal:  Deferred.  Msk:  Symmetrical without gross deformities.  Good, equal movement & strength bilaterally. Pulses:  Normal pulses noted. Extremities:  No clubbing or edema.  No cyanosis. Neurologic:  Alert and oriented x3;  grossly normal neurologically. Skin:  Intact without significant lesions or rashes.  No jaundice. Lymph Nodes:  No significant cervical adenopathy. Psych:  Alert and cooperative. Normal mood and affect.  Imaging Studies: No results found.  Assessment and Plan:   Kristina Jordan is a 66 y.o. y/o female who comes in today with a finding of heme-positive stools by her primary care provider. The patient has not had a colonoscopy since 2008. The patient will be set up for colonoscopy to look for source of her heme-positive stools. I have discussed risks & benefits which include, but are not limited to, bleeding, infection, perforation & drug reaction.  The patient agrees with this plan & written consent will be obtained.       [name], MD. Marval Regal   Note: This dictation was prepared with Dragon dictation  along with smaller phrase technology. Any transcriptional errors that result from this process are unintentional.

## 2016-11-26 NOTE — Telephone Encounter (Signed)
error 

## 2016-11-27 ENCOUNTER — Telehealth: Payer: Self-pay | Admitting: Gastroenterology

## 2016-11-27 NOTE — Telephone Encounter (Signed)
Has some questions regarding what medications to stop before her procedure.

## 2016-11-27 NOTE — Telephone Encounter (Signed)
Pt has questions regarding medications and her low fiber diet. All questions were answered and pt verbalized understanding of these instructions.

## 2016-12-01 ENCOUNTER — Encounter: Payer: Self-pay | Admitting: *Deleted

## 2016-12-01 ENCOUNTER — Telehealth: Payer: Self-pay

## 2016-12-01 NOTE — Telephone Encounter (Signed)
Please call patient with time of colonoscopy. Thanks

## 2016-12-01 NOTE — Telephone Encounter (Signed)
Pt will need to get arrival time from [place] in [facility].

## 2016-12-02 ENCOUNTER — Ambulatory Visit
Admission: RE | Admit: 2016-12-02 | Discharge: 2016-12-02 | Disposition: A | Discharge status: Home / Self Care | Payer: Medicare HMO | Source: Ambulatory Visit | Attending: Gastroenterology | Admitting: Gastroenterology

## 2016-12-02 ENCOUNTER — Ambulatory Visit: Payer: Medicare HMO | Admitting: Anesthesiology

## 2016-12-02 ENCOUNTER — Encounter
Admission: RE | Disposition: A | Discharge status: Home / Self Care | Payer: Self-pay | Source: Ambulatory Visit | Attending: Gastroenterology

## 2016-12-02 ENCOUNTER — Encounter: Payer: Self-pay | Admitting: *Deleted

## 2016-12-02 DIAGNOSIS — I739 Peripheral vascular disease, unspecified: Secondary | ICD-10-CM | POA: Insufficient documentation

## 2016-12-02 DIAGNOSIS — Z90710 Acquired absence of both cervix and uterus: Secondary | ICD-10-CM | POA: Insufficient documentation

## 2016-12-02 DIAGNOSIS — K5730 Diverticulosis of large intestine without perforation or abscess without bleeding: Secondary | ICD-10-CM | POA: Insufficient documentation

## 2016-12-02 DIAGNOSIS — I341 Nonrheumatic mitral (valve) prolapse: Secondary | ICD-10-CM | POA: Diagnosis not present

## 2016-12-02 DIAGNOSIS — I252 Old myocardial infarction: Secondary | ICD-10-CM | POA: Insufficient documentation

## 2016-12-02 DIAGNOSIS — I429 Cardiomyopathy, unspecified: Secondary | ICD-10-CM | POA: Insufficient documentation

## 2016-12-02 DIAGNOSIS — Z882 Allergy status to sulfonamides status: Secondary | ICD-10-CM | POA: Insufficient documentation

## 2016-12-02 DIAGNOSIS — K921 Melena: Secondary | ICD-10-CM | POA: Diagnosis not present

## 2016-12-02 DIAGNOSIS — Z8052 Family history of malignant neoplasm of bladder: Secondary | ICD-10-CM | POA: Diagnosis not present

## 2016-12-02 DIAGNOSIS — I2510 Atherosclerotic heart disease of native coronary artery without angina pectoris: Secondary | ICD-10-CM | POA: Insufficient documentation

## 2016-12-02 DIAGNOSIS — Z8249 Family history of ischemic heart disease and other diseases of the circulatory system: Secondary | ICD-10-CM | POA: Diagnosis not present

## 2016-12-02 DIAGNOSIS — Z87891 Personal history of nicotine dependence: Secondary | ICD-10-CM | POA: Insufficient documentation

## 2016-12-02 DIAGNOSIS — R195 Other fecal abnormalities: Secondary | ICD-10-CM | POA: Diagnosis not present

## 2016-12-02 DIAGNOSIS — Z7982 Long term (current) use of aspirin: Secondary | ICD-10-CM | POA: Insufficient documentation

## 2016-12-02 DIAGNOSIS — K648 Other hemorrhoids: Secondary | ICD-10-CM | POA: Diagnosis not present

## 2016-12-02 DIAGNOSIS — I4891 Unspecified atrial fibrillation: Secondary | ICD-10-CM | POA: Insufficient documentation

## 2016-12-02 DIAGNOSIS — Z79899 Other long term (current) drug therapy: Secondary | ICD-10-CM | POA: Diagnosis not present

## 2016-12-02 DIAGNOSIS — Z8349 Family history of other endocrine, nutritional and metabolic diseases: Secondary | ICD-10-CM | POA: Diagnosis not present

## 2016-12-02 DIAGNOSIS — K640 First degree hemorrhoids: Secondary | ICD-10-CM | POA: Diagnosis not present

## 2016-12-02 DIAGNOSIS — Z820 Family history of epilepsy and other diseases of the nervous system: Secondary | ICD-10-CM | POA: Insufficient documentation

## 2016-12-02 DIAGNOSIS — I253 Aneurysm of heart: Secondary | ICD-10-CM | POA: Diagnosis not present

## 2016-12-02 HISTORY — PX: COLONOSCOPY WITH PROPOFOL: SHX5780

## 2016-12-02 SURGERY — COLONOSCOPY WITH PROPOFOL
Anesthesia: General

## 2016-12-02 MED ORDER — PROPOFOL 10 MG/ML IV BOLUS
INTRAVENOUS | Status: DC | PRN
  Administered 2016-12-02: 50 mg via INTRAVENOUS
  Administered 2016-12-02: 20 mg via INTRAVENOUS

## 2016-12-02 MED ORDER — PROPOFOL 500 MG/50ML IV EMUL
INTRAVENOUS | Status: AC
  Filled 2016-12-02: qty 50

## 2016-12-02 MED ORDER — PROPOFOL 500 MG/50ML IV EMUL
INTRAVENOUS | Status: DC | PRN
  Administered 2016-12-02: 150 ug/kg/min via INTRAVENOUS

## 2016-12-02 MED ORDER — SODIUM CHLORIDE 0.9 % IJ SOLN
INTRAMUSCULAR | Status: AC
  Filled 2016-12-02: qty 10

## 2016-12-02 MED ORDER — PROPOFOL 10 MG/ML IV BOLUS
INTRAVENOUS | Status: AC
  Filled 2016-12-02: qty 20

## 2016-12-02 MED ORDER — GLYCOPYRROLATE 0.2 MG/ML IJ SOLN
INTRAMUSCULAR | Status: AC
  Filled 2016-12-02: qty 1

## 2016-12-02 MED ORDER — SODIUM CHLORIDE 0.9 % IV SOLN
INTRAVENOUS | Status: DC
  Administered 2016-12-02: 09:00:00 via INTRAVENOUS

## 2016-12-02 NOTE — Anesthesia Post-op Follow-up Note (Cosign Needed)
Anesthesia QCDR form completed.        

## 2016-12-02 NOTE — H&P (Signed)
[name], MD [facility] [address] [place], [place] [address] Phone:[phone] Fax : [phone]  Primary Care Physician:  Vernie Murders, PA Primary Gastroenterologist:  Dr. Allen Norris  Pre-Procedure History & Physical: HPI:  Kristina Jordan is a 66 y.o. female is here for an colonoscopy.   Past Medical History:  Diagnosis Date  . Atrial septal aneurysm   . CARDIOMYOPATHY   . Coronary artery disease   . Dysrhythmia    hx a-fib  . FIBRILLATION, ATRIAL   . MITRAL INSUFFICIENCY   . MITRAL VALVE PROLAPSE     Past Surgical History:  Procedure Laterality Date  . ABDOMINAL HYSTERECTOMY    . BREAST BIOPSY Left    neg  . BREAST BIOPSY Left    stereo- neg  . CERVICAL SPINE SURGERY    . LEFT HEART CATHETERIZATION WITH CORONARY ANGIOGRAM N/A 09/10/2011   Procedure: LEFT HEART CATHETERIZATION WITH CORONARY ANGIOGRAM;  Surgeon: Wellington Hampshire, MD;  Location: [facility];  Service: Cardiovascular;  Laterality: N/A;    Prior to Admission medications   Medication Sig Start Date End Date Taking? Authorizing Provider  alendronate (FOSAMAX) 70 MG tablet Take 1 tablet (70 mg total) by mouth every 7 (seven) days. Take with a full glass of water on an empty stomach. Don't lie down until after eating breakfast. 07/17/16  Yes Vickki Muff Chrismon, PA  aspirin 81 MG tablet Take 81 mg by mouth daily.   Yes Historical Provider, MD  Calcium Carb-Cholecalciferol (CALCIUM 600 + D PO) Take 2 capsules by mouth daily.    Yes Historical Provider, MD  Cholecalciferol (VITAMIN D-3) 1000 UNITS CAPS Take 2,000 Units by mouth once.   Yes Historical Provider, MD  diltiazem (CARDIZEM) 120 MG tablet TAKE ONE TABLET BY MOUTH ONCE DAILY 08/19/16  Yes Vickki Muff Chrismon, PA  propranolol (INDERAL) 40 MG tablet Take 1 tablet (40 mg total) by mouth 2 (two) times daily. 11/20/16  Yes Dennis E Chrismon, PA  meclizine (ANTIVERT) 25 MG tablet Take 1 tablet (25 mg total) by mouth 3 (three) times daily as needed for  dizziness. Patient not taking: Reported on 11/20/2016 10/28/16   Carmon Ginsberg, PA  naproxen (NAPROSYN) 500 MG tablet Take 1 tablet (500 mg total) by mouth 2 (two) times daily with a meal. Patient not taking: Reported on 03/31/2016 11/30/15   Vickki Muff Chrismon, PA  ondansetron (ZOFRAN) 4 MG tablet Take 1 tablet (4 mg total) by mouth every 8 (eight) hours as needed for nausea or vomiting. Patient not taking: Reported on 11/26/2016 10/28/16   Carmon Ginsberg, PA    Allergies as of 11/26/2016 - Review Complete 11/26/2016  Allergen Reaction Noted  . Sulfonamide derivatives Rash     Family History  Problem Relation Age of Onset  . Multiple sclerosis Sister   . Cancer Brother     bladder  . Heart attack Mother   . Cancer Sister     bladder  . Coronary artery disease    . Thyroid disease    . Breast cancer Neg Hx     Social History   Social History  . Marital status: Married    Spouse name: N/A  . Number of children: N/A  . Years of education: N/A   Occupational History  . Not on file.   Social History Main Topics  . Smoking status: Former Smoker    Types: Cigarettes  . Smokeless tobacco: Never Used     Comment: only socially  . Alcohol use  No  . Drug use: No  . Sexual activity: Yes   Other Topics Concern  . Not on file   Social History Narrative  . No narrative on file    Review of Systems: See HPI, otherwise negative ROS  Physical Exam: There were no vitals taken for this visit. General:   Alert,  pleasant and cooperative in NAD Head:  Normocephalic and atraumatic. Neck:  Supple; no masses or thyromegaly. Lungs:  Clear throughout to auscultation.    Heart:  Regular rate and rhythm. Abdomen:  Soft, nontender and nondistended. Normal bowel sounds, without guarding, and without rebound.   Neurologic:  Alert and  oriented x4;  grossly normal neurologically.  Impression/Plan: Kristina Jordan is here for an colonoscopy to be performed for heme positive  stools.  Risks, benefits, limitations, and alternatives regarding  colonoscopy have been reviewed with the patient.  Questions have been answered.  All parties agreeable.   [name], MD  12/02/2016, 8:26 AM

## 2016-12-02 NOTE — Anesthesia Preprocedure Evaluation (Signed)
Anesthesia Evaluation  Patient identified by MRN, date of birth, ID band Patient awake    Reviewed: Allergy & Precautions, NPO status , Patient's Chart, lab work & pertinent test results  History of Anesthesia Complications (+) PONV  Airway Mallampati: II       Dental  (+) Partial Lower, Partial Upper   Pulmonary neg pulmonary ROS, former smoker,           Cardiovascular + CAD, + Past MI and + Peripheral Vascular Disease  + dysrhythmias Atrial Fibrillation + Valvular Problems/Murmurs MVP      Neuro/Psych    GI/Hepatic negative GI ROS, Neg liver ROS,   Endo/Other  negative endocrine ROS  Renal/GU negative Renal ROS     Musculoskeletal   Abdominal   Peds  Hematology negative hematology ROS (+)   Anesthesia Other Findings   Reproductive/Obstetrics                             Anesthesia Physical Anesthesia Plan  ASA: III  Anesthesia Plan: General   Post-op Pain Management:    Induction: Intravenous  Airway Management Planned: Nasal Cannula  Additional Equipment:   Intra-op Plan:   Post-operative Plan:   Informed Consent: I have reviewed the patients History and Physical, chart, labs and discussed the procedure including the risks, benefits and alternatives for the proposed anesthesia with the patient or authorized representative who has indicated his/her understanding and acceptance.     Plan Discussed with:   Anesthesia Plan Comments:         Anesthesia Quick Evaluation

## 2016-12-02 NOTE — Anesthesia Postprocedure Evaluation (Signed)
Anesthesia Post Note  Patient: Kristina Jordan  Procedure(s) Performed: Procedure(s) (LRB): COLONOSCOPY WITH PROPOFOL (N/A)  Patient location during evaluation: Endoscopy Anesthesia Type: General Level of consciousness: awake and alert Pain management: pain level controlled Vital Signs Assessment: post-procedure vital signs reviewed and stable Respiratory status: spontaneous breathing and respiratory function stable Cardiovascular status: stable Anesthetic complications: no     Last Vitals:  Vitals:   12/02/16 0825 12/02/16 0904  BP: 127/60 (!) 96/52  Pulse: 86 99  Resp: 18 18  Temp: 36.2 C 36.8 C    Last Pain:  Vitals:   12/02/16 0904  TempSrc: Tympanic                 KEPHART,WILLIAM K

## 2016-12-02 NOTE — Op Note (Addendum)
[facility] Gastroenterology Patient Name: Kristina Jordan Procedure Date: 12/02/2016 8:44 AM MRN: [id number] Account #: [id number] Date of Birth: October 19, 1950 Admit Type: Outpatient Age: 66 Room: [facility] ENDO ROOM 4 Gender: Female Note Status: Finalized Procedure:            Colonoscopy Indications:          Heme positive stool Providers:            [name] MD, MD Referring MD:         Vickki Muff. Chrismon, MD (Referring MD) Medicines:            Propofol per Anesthesia Complications:        No immediate complications. Procedure:            Pre-Anesthesia Assessment:                       - Prior to the procedure, a History and Physical was                        performed, and patient medications and allergies were                        reviewed. The patient's tolerance of previous                        anesthesia was also reviewed. The risks and benefits of                        the procedure and the sedation options and risks were                        discussed with the patient. All questions were                        answered, and informed consent was obtained. Prior                        Anticoagulants: The patient has taken no previous                        anticoagulant or antiplatelet agents. ASA Grade                        Assessment: II - A patient with mild systemic disease.                        After reviewing the risks and benefits, the patient was                        deemed in satisfactory condition to undergo the                        procedure.                       After obtaining informed consent, the colonoscope was                        passed under direct vision. Throughout the procedure,  the patient's blood pressure, pulse, and oxygen                        saturations were monitored continuously. The Olympus                        CF-HQ190L Colonoscope (S#. [id number]) was introduced   through the anus and advanced to the the cecum,                        identified by appendiceal orifice and ileocecal valve.                        The colonoscopy was performed without difficulty. The                        patient tolerated the procedure well. The quality of                        the bowel preparation was excellent. Findings:      The perianal and digital rectal examinations were normal.      Non-bleeding internal hemorrhoids were found during retroflexion. The       hemorrhoids were Grade I (internal hemorrhoids that do not prolapse).      A few small-mouthed diverticula were found in the sigmoid colon. Impression:           - Non-bleeding internal hemorrhoids.                       - Diverticulosis in the sigmoid colon.                       - No specimens collected. Recommendation:       - Discharge patient to home.                       - Resume previous diet.                       - Continue present medications.                       - Repeat colonoscopy in 10 years for screening unless                        any change in family history or lower GI problems. Procedure Code(s):    --- Professional ---                       [id number], Colonoscopy, flexible; diagnostic, including                        collection of specimen(s) by brushing or washing, when                        performed (separate procedure) Diagnosis Code(s):    --- Professional ---                       R19.5, Other fecal abnormalities CPT copyright 2016 American Medical Association. All rights reserved. The codes documented in this report are preliminary and upon coder review may  be revised  to meet current compliance requirements. [name] MD, MD 12/02/2016 9:02:15 AM This report has been signed electronically. Number of Addenda: 0 Note Initiated On: 12/02/2016 8:44 AM Scope Withdrawal Time: 0 hours 8 minutes 4 seconds  Total Procedure Duration: 0 hours 10 minutes 49 seconds       [facility]

## 2016-12-02 NOTE — Transfer of Care (Signed)
Immediate Anesthesia Transfer of Care Note  Patient: Kristina Jordan  Procedure(s) Performed: Procedure(s): COLONOSCOPY WITH PROPOFOL (N/A)  Patient Location: PACU  Anesthesia Type:General  Level of Consciousness: responds to stimulation  Airway & Oxygen Therapy: Patient Spontanous Breathing and Patient connected to nasal cannula oxygen  Post-op Assessment: Report given to RN and Post -op Vital signs reviewed and stable  Post vital signs: Reviewed and stable  Last Vitals:  Vitals:   12/02/16 0825 12/02/16 0904  BP: 127/60 (!) 96/52  Pulse: 86 99  Resp: 18 18  Temp: 36.2 C 36.8 C    Last Pain:  Vitals:   12/02/16 0904  TempSrc: Tympanic         Complications: No apparent anesthesia complications

## 2016-12-02 NOTE — Anesthesia Procedure Notes (Signed)
Date/Time: 12/02/2016 8:44 AM Performed by: Darlyne Russian Pre-anesthesia Checklist: Patient identified, Emergency Drugs available, Suction available, Patient being monitored and Timeout performed Patient Re-evaluated:Patient Re-evaluated prior to inductionOxygen Delivery Method: Nasal cannula

## 2016-12-03 ENCOUNTER — Encounter: Payer: Self-pay | Admitting: Gastroenterology

## 2016-12-08 DIAGNOSIS — R69 Illness, unspecified: Secondary | ICD-10-CM | POA: Diagnosis not present

## 2016-12-25 ENCOUNTER — Encounter: Payer: Self-pay | Admitting: Family Medicine

## 2016-12-25 ENCOUNTER — Ambulatory Visit (INDEPENDENT_AMBULATORY_CARE_PROVIDER_SITE_OTHER): Payer: Medicare HMO | Admitting: Family Medicine

## 2016-12-25 VITALS — BP 118/64 | HR 68 | Temp 97.6°F | Wt 133.6 lb

## 2016-12-25 DIAGNOSIS — M6790 Unspecified disorder of synovium and tendon, unspecified site: Secondary | ICD-10-CM | POA: Diagnosis not present

## 2016-12-25 NOTE — Progress Notes (Signed)
Patient: Kristina Jordan Female    DOB: 1950-10-29   66 y.o.   MRN: [id number] Visit Date: 12/25/2016  Today's Provider: Vernie Murders, PA   Chief Complaint  Patient presents with  . Hand Problem   Subjective:    HPI Patient is here to discuss a "knot" on the inside of left hand near base of index finger. She reports noticing the change in December, but states the knot is getting bigger. Patient denies pain or injury to left hand.   Patient Active Problem List   Diagnosis Date Noted  . Heme + stool   . Hypercholesterolemia without hypertriglyceridemia 11/19/2015  . Avitaminosis D 11/19/2015  . Atypical chest pain 09/09/2011  . Non-ST elevation MI (NSTEMI) ([place]) 09/09/2011  . 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 MITRAL INSUFFICIENCY 12/26/2009  . Mitral valve disorder 12/24/2009  . FIBRILLATION, ATRIAL 12/24/2009  . Heart disease 11/01/2009  . 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 Acquired trigger finger 01/02/2009  . Need for prophylactic hormone replacement therapy (postmenopausal) 10/09/2006  . OP (osteoporosis) 10/09/2006   Past Surgical History:  Procedure Laterality Date  . ABDOMINAL HYSTERECTOMY    . BREAST BIOPSY Left    neg  . BREAST BIOPSY Left    stereo- neg  . CERVICAL SPINE SURGERY    . COLONOSCOPY WITH PROPOFOL N/A 12/02/2016   Procedure: COLONOSCOPY WITH PROPOFOL;  Surgeon: [name], MD;  Location: [facility] ENDOSCOPY;  Service: Endoscopy;  Laterality: N/A;  . LEFT HEART CATHETERIZATION WITH CORONARY ANGIOGRAM N/A 09/10/2011   Procedure: LEFT HEART CATHETERIZATION WITH CORONARY ANGIOGRAM;  Surgeon: Wellington Hampshire, MD;  Location: [place] CATH LAB;  Service: Cardiovascular;  Laterality: N/A;   Family History  Problem Relation Age of Onset  . Multiple sclerosis Sister   . Cancer Brother     bladder  . Heart attack Mother   . Cancer Sister     bladder  . Coronary artery disease    . Thyroid disease    . Breast cancer Neg Hx    Allergies  Allergen Reactions  . Sulfonamide  Derivatives Rash     Previous Medications   ALENDRONATE (FOSAMAX) 70 MG TABLET    Take 1 tablet (70 mg total) by mouth every 7 (seven) days. Take with a full glass of water on an empty stomach. Don't lie down until after eating breakfast.   ASPIRIN 81 MG TABLET    Take 81 mg by mouth daily.   CALCIUM CARB-CHOLECALCIFEROL (CALCIUM 600 + D PO)    Take 2 capsules by mouth daily.    CHOLECALCIFEROL (VITAMIN D-3) 1000 UNITS CAPS    Take 2,000 Units by mouth once.   DILTIAZEM (CARDIZEM) 120 MG TABLET    TAKE ONE TABLET BY MOUTH ONCE DAILY   PROPRANOLOL (INDERAL) 40 MG TABLET    Take 1 tablet (40 mg total) by mouth 2 (two) times daily.    Review of Systems  Constitutional: Negative.   Respiratory: Negative.   Cardiovascular: Negative.   Skin:       Knot on inside of left hand    Social History  Substance Use Topics  . Smoking status: Former Smoker    Types: Cigarettes  . Smokeless tobacco: Never Used     Comment: only socially  . Alcohol use No   Objective:   BP 118/64 (BP Location: Right Arm, Patient Position: Sitting, Cuff Size: Normal)   Pulse 68   Temp 97.6 F (36.4 C) (Oral)   Wt 133 lb 9.6 oz (  60.6 kg)   SpO2 98%   BMI 22.93 kg/m   Physical Exam  Constitutional: She is oriented to person, place, and time. She appears well-developed and well-nourished. No distress.  HENT:  Head: Normocephalic and atraumatic.  Right Ear: Hearing normal.  Left Ear: Hearing normal.  Nose: Nose normal.  Eyes: Conjunctivae and lids are normal. Right eye exhibits no discharge. Left eye exhibits no discharge. No scleral icterus.  Pulmonary/Chest: Effort normal. No respiratory distress.  Musculoskeletal: Normal range of motion.  Nodule on the flexor tendon at the palmar base of the left index finger. Hard and moves with tendon movement. No locking or significant tenderness.  Neurological: She is alert and oriented to person, place, and time.  Skin: Skin is intact. No lesion and no rash noted.    Psychiatric: She has a normal mood and affect. Her speech is normal and behavior is normal. Thought content normal.      Assessment & Plan:     1. Nodule of flexor tendon sheath Nodule on the left index finger flexor tendon without known injury. First noticed Dec. 2017. No pain today but lesion is getting larger. Will schedule evaluation with a hand surgeon. - Ambulatory referral to Hand Surgery

## 2016-12-30 DIAGNOSIS — M67442 Ganglion, left hand: Secondary | ICD-10-CM | POA: Diagnosis not present

## 2016-12-30 DIAGNOSIS — M67449 Ganglion, unspecified hand: Secondary | ICD-10-CM | POA: Insufficient documentation

## 2017-01-20 ENCOUNTER — Other Ambulatory Visit: Payer: Self-pay | Admitting: Family Medicine

## 2017-06-01 DIAGNOSIS — H0013 Chalazion right eye, unspecified eyelid: Secondary | ICD-10-CM | POA: Diagnosis not present

## 2017-06-03 DIAGNOSIS — H0013 Chalazion right eye, unspecified eyelid: Secondary | ICD-10-CM | POA: Diagnosis not present

## 2017-06-09 ENCOUNTER — Other Ambulatory Visit: Payer: Self-pay | Admitting: Family Medicine

## 2017-06-16 DIAGNOSIS — R69 Illness, unspecified: Secondary | ICD-10-CM | POA: Diagnosis not present

## 2017-06-22 ENCOUNTER — Ambulatory Visit: Payer: Medicare HMO | Admitting: Family Medicine

## 2017-07-02 ENCOUNTER — Other Ambulatory Visit: Payer: Self-pay | Admitting: Family Medicine

## 2017-07-02 DIAGNOSIS — Z1231 Encounter for screening mammogram for malignant neoplasm of breast: Secondary | ICD-10-CM

## 2017-07-16 ENCOUNTER — Other Ambulatory Visit: Payer: Self-pay | Admitting: Family Medicine

## 2017-08-04 ENCOUNTER — Ambulatory Visit (INDEPENDENT_AMBULATORY_CARE_PROVIDER_SITE_OTHER): Payer: Medicare HMO | Admitting: Family Medicine

## 2017-08-04 DIAGNOSIS — L821 Other seborrheic keratosis: Secondary | ICD-10-CM | POA: Diagnosis not present

## 2017-08-04 DIAGNOSIS — Z23 Encounter for immunization: Secondary | ICD-10-CM | POA: Diagnosis not present

## 2017-08-04 DIAGNOSIS — D2272 Melanocytic nevi of left lower limb, including hip: Secondary | ICD-10-CM | POA: Diagnosis not present

## 2017-08-04 DIAGNOSIS — D225 Melanocytic nevi of trunk: Secondary | ICD-10-CM | POA: Diagnosis not present

## 2017-08-04 DIAGNOSIS — D2261 Melanocytic nevi of right upper limb, including shoulder: Secondary | ICD-10-CM | POA: Diagnosis not present

## 2017-08-10 ENCOUNTER — Other Ambulatory Visit (HOSPITAL_COMMUNITY): Payer: Medicare HMO

## 2017-08-10 NOTE — Progress Notes (Signed)
HPI The patient presents for followup after a non-Q-wave myocardial infarction in 2012.   A cardiac catheterization demonstrated normal coronary arteries. Her ejection fraction was low normal at that time although it was normal on the last echo. She did have some mild mitral valve prolapse with only mild regurgitation. She had a septal aneurysm with fenestrations incidentally noted. Since I last saw her she has done well.  She does not notice any palpitations, presyncope or syncope.  She does ride a stationary bicycle and does household chores.  She denies any symptoms with this.  She had an echocardiogram today which demonstrated some bilateral mitral valve prolapse that was mild with some mild mitral regurgitation but otherwise a preserved ejection fraction and no other significant abnormalities.    Allergies  Allergen Reactions  . Sulfonamide Derivatives Rash    Current Outpatient Medications  Medication Sig Dispense Refill  . alendronate (FOSAMAX) 70 MG tablet TAKE ONE TABLET BY MOUTH EVERY 7 DAYS WITH A FULL GLASS OF WATER ON AN EMPTY STOMACH. DO NOT LIE DOWN UNTIL AFTER BREAKFAST 12 tablet 3  . aspirin 81 MG tablet Take 81 mg by mouth daily.    . Calcium Carb-Cholecalciferol (CALCIUM 600 + D PO) Take 2 capsules by mouth daily.     . Cholecalciferol (VITAMIN D-3) 1000 UNITS CAPS Take 2,000 Units by mouth once.    . diltiazem (CARDIZEM) 120 MG tablet TAKE ONE TABLET BY MOUTH ONCE DAILY 90 tablet 3  . propranolol (INDERAL) 40 MG tablet TAKE 1 TABLET BY MOUTH TWICE DAILY 180 tablet 0   No current facility-administered medications for this visit.     Past Medical History:  Diagnosis Date  . MITRAL INSUFFICIENCY   . MITRAL VALVE PROLAPSE     Past Surgical History:  Procedure Laterality Date  . ABDOMINAL HYSTERECTOMY    . BREAST BIOPSY Left    neg  . BREAST BIOPSY Left    stereo- neg  . CERVICAL SPINE SURGERY    . COLONOSCOPY WITH PROPOFOL N/A 12/02/2016   Procedure:  COLONOSCOPY WITH PROPOFOL;  Surgeon: [name], MD;  Location: [facility] ENDOSCOPY;  Service: Endoscopy;  Laterality: N/A;  . LEFT HEART CATHETERIZATION WITH CORONARY ANGIOGRAM N/A 09/10/2011   Procedure: LEFT HEART CATHETERIZATION WITH CORONARY ANGIOGRAM;  Surgeon: Wellington Hampshire, MD;  Location: [place] CATH LAB;  Service: Cardiovascular;  Laterality: N/A;    ROS:   As stated in the HPI and negative for all other systems.  PHYSICAL EXAM BP (!) 100/58   Pulse 68   Ht 5\' 4"  (1.626 m)   Wt 128 lb (58.1 kg)   SpO2 98%   BMI 21.97 kg/m   GENERAL:  Well appearing NECK:  No jugular venous distention, waveform within normal limits, carotid upstroke brisk and symmetric, no bruits, no thyromegaly LUNGS:  Clear to auscultation bilaterally CHEST:  Unremarkable HEART:  PMI not displaced or sustained,S1 and S2 within normal limits, no S3, no S4, no clicks, no rubs, no murmurs ABD:  Flat, positive bowel sounds normal in frequency in pitch, no bruits, no rebound, no guarding, no midline pulsatile mass, no hepatomegaly, no splenomegaly EXT:  2 plus pulses throughout, no edema, no cyanosis no clubbing     EKG:  Sinus rhythm, rate 59 PACs, axis within normal limits, intervals within normal limits, no acute ST-T wave changes.   08/11/2017  ASSESSMENT AND PLAN   MITRAL VALVE PROLAPSE -  This was mild on echo today.  There was only mild  mitral regurgitation.  Follow-up with another echocardiogram in about 18 months.  CARDIOMYOPATHY Her ejection fraction was normal.  No change in therapy is indicated.  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  No change in therapy is indicated.

## 2017-08-11 ENCOUNTER — Ambulatory Visit: Payer: Medicare HMO | Admitting: Cardiology

## 2017-08-11 ENCOUNTER — Other Ambulatory Visit: Payer: Self-pay | Admitting: Family Medicine

## 2017-08-11 ENCOUNTER — Ambulatory Visit (HOSPITAL_COMMUNITY): Payer: Medicare HMO | Attending: Cardiology

## 2017-08-11 ENCOUNTER — Other Ambulatory Visit: Payer: Self-pay

## 2017-08-11 ENCOUNTER — Encounter: Payer: Self-pay | Admitting: Cardiology

## 2017-08-11 VITALS — BP 100/58 | HR 68 | Ht 64.0 in | Wt 128.0 lb

## 2017-08-11 DIAGNOSIS — I340 Nonrheumatic mitral (valve) insufficiency: Secondary | ICD-10-CM | POA: Insufficient documentation

## 2017-08-11 DIAGNOSIS — I341 Nonrheumatic mitral (valve) prolapse: Secondary | ICD-10-CM | POA: Diagnosis not present

## 2017-08-11 DIAGNOSIS — I4891 Unspecified atrial fibrillation: Secondary | ICD-10-CM | POA: Insufficient documentation

## 2017-08-11 DIAGNOSIS — I429 Cardiomyopathy, unspecified: Secondary | ICD-10-CM | POA: Diagnosis not present

## 2017-08-11 DIAGNOSIS — I2510 Atherosclerotic heart disease of native coronary artery without angina pectoris: Secondary | ICD-10-CM | POA: Insufficient documentation

## 2017-08-11 NOTE — Patient Instructions (Signed)
Medication Instructions:  Continue current medications  If you need a refill on your cardiac medications before your next appointment, please call your pharmacy.  Labwork: None Ordered   Testing/Procedures: None Ordered   Follow-Up: Your physician wants you to follow-up in: 18 Months. You should receive a reminder letter in the mail two months in advance. If you do not receive a letter, please call our office [phone].     Thank you for choosing [facility]!!      

## 2017-08-14 ENCOUNTER — Ambulatory Visit
Admission: RE | Admit: 2017-08-14 | Discharge: 2017-08-14 | Disposition: A | Discharge status: Home / Self Care | Payer: Medicare HMO | Source: Ambulatory Visit | Attending: Family Medicine | Admitting: Family Medicine

## 2017-08-14 DIAGNOSIS — Z1231 Encounter for screening mammogram for malignant neoplasm of breast: Secondary | ICD-10-CM | POA: Insufficient documentation

## 2017-08-15 ENCOUNTER — Telehealth: Payer: Self-pay

## 2017-08-15 NOTE — Telephone Encounter (Signed)
LMTCB  Thanks,  -Kristina Jordan 

## 2017-08-15 NOTE — Telephone Encounter (Signed)
-----   Message from Margo Common, [place] sent at 08/14/2017  5:58 PM EST ----- Normal mammograms. Radiologist recommended repeat screening in a year.

## 2017-08-17 NOTE — Telephone Encounter (Signed)
Pt returned call ° °Thanks  teri °

## 2017-08-17 NOTE — Telephone Encounter (Signed)
Patient advised as directed below.  Thanks,  -Joseline 

## 2017-10-15 ENCOUNTER — Other Ambulatory Visit: Payer: Self-pay | Admitting: Family Medicine

## 2017-11-04 ENCOUNTER — Encounter: Payer: Self-pay | Admitting: Physician Assistant

## 2017-11-04 ENCOUNTER — Ambulatory Visit (INDEPENDENT_AMBULATORY_CARE_PROVIDER_SITE_OTHER): Payer: Medicare HMO | Admitting: Physician Assistant

## 2017-11-04 VITALS — BP 110/60 | HR 65 | Temp 97.7°F | Resp 16 | Wt 130.6 lb

## 2017-11-04 DIAGNOSIS — N3001 Acute cystitis with hematuria: Secondary | ICD-10-CM

## 2017-11-04 DIAGNOSIS — R311 Benign essential microscopic hematuria: Secondary | ICD-10-CM | POA: Diagnosis not present

## 2017-11-04 DIAGNOSIS — R310 Gross hematuria: Secondary | ICD-10-CM | POA: Diagnosis not present

## 2017-11-04 DIAGNOSIS — N390 Urinary tract infection, site not specified: Secondary | ICD-10-CM

## 2017-11-04 LAB — POCT URINALYSIS DIPSTICK
Bilirubin, UA: NEGATIVE
GLUCOSE UA: NEGATIVE
Ketones, UA: NEGATIVE
NITRITE UA: NEGATIVE
PROTEIN UA: 30
SPEC GRAV UA: 1.015 (ref 1.010–1.025)
UROBILINOGEN UA: 0.2 U/dL
pH, UA: 6 (ref 5.0–8.0)

## 2017-11-04 MED ORDER — PHENAZOPYRIDINE HCL 100 MG PO TABS: 100.0000 mg | ORAL_TABLET | Freq: Three times a day (TID) | ORAL | 0 refills | Status: DC | PRN

## 2017-11-04 MED ORDER — NITROFURANTOIN MONOHYD MACRO 100 MG PO CAPS: 100.0000 mg | ORAL_CAPSULE | Freq: Two times a day (BID) | ORAL | 0 refills | Status: DC

## 2017-11-04 NOTE — Patient Instructions (Signed)

## 2017-11-04 NOTE — Progress Notes (Signed)
Patient: Kristina Jordan Female    DOB: Aug 04, 1951   67 y.o.   MRN: [id number] Visit Date: 11/04/2017  Today's Provider: Mar Daring, PA-C   Chief Complaint  Patient presents with  . Urinary Tract Infection   Subjective:    Urinary Tract Infection   This is a new problem. The current episode started yesterday. The problem occurs intermittently. The problem has been gradually worsening. The quality of the pain is described as aching and burning. The pain is at a severity of 0/10. The patient is experiencing no pain. There has been no fever. There is no history of pyelonephritis. Associated symptoms include frequency and urgency. Pertinent negatives include no chills, discharge, hematuria, nausea or vomiting. Associated symptoms comments: Pressure. She has tried increased fluids for the symptoms. Her past medical history is significant for recurrent UTIs.       Allergies  Allergen Reactions  . Sulfonamide Derivatives Rash     Current Outpatient Medications:  .  alendronate (FOSAMAX) 70 MG tablet, TAKE ONE TABLET BY MOUTH EVERY 7 DAYS WITH A FULL GLASS OF WATER ON AN EMPTY STOMACH. DO NOT LIE DOWN UNTIL AFTER BREAKFAST, Disp: 12 tablet, Rfl: 3 .  aspirin 81 MG tablet, Take 81 mg by mouth daily., Disp: , Rfl:  .  Calcium Carb-Cholecalciferol (CALCIUM 600 + D PO), Take 2 capsules by mouth daily. , Disp: , Rfl:  .  Cholecalciferol (VITAMIN D-3) 1000 UNITS CAPS, Take 2,000 Units by mouth once., Disp: , Rfl:  .  diltiazem (CARDIZEM) 120 MG tablet, TAKE ONE TABLET BY MOUTH ONCE DAILY, Disp: 90 tablet, Rfl: 3 .  propranolol (INDERAL) 40 MG tablet, TAKE 1 TABLET BY MOUTH TWICE DAILY, Disp: 180 tablet, Rfl: 0 .  nitrofurantoin, macrocrystal-monohydrate, (MACROBID) 100 MG capsule, Take 1 capsule (100 mg total) by mouth 2 (two) times daily., Disp: 14 capsule, Rfl: 0 .  phenazopyridine (PYRIDIUM) 100 MG tablet, Take 1 tablet (100 mg total) by mouth 3 (three) times daily as needed for  pain., Disp: 10 tablet, Rfl: 0  Review of Systems  Constitutional: Negative for chills and fever.  HENT: Negative.   Respiratory: Negative for cough, chest tightness and shortness of breath.   Cardiovascular: Negative for chest pain, palpitations and leg swelling.  Gastrointestinal: Positive for abdominal pain (right lower side). Negative for abdominal distention (right lower side), nausea and vomiting.  Genitourinary: Positive for dysuria, frequency and urgency. Negative for hematuria and pelvic pain.  Musculoskeletal: Negative for back pain.    Social History   Tobacco Use  . Smoking status: Former Smoker    Types: Cigarettes  . Smokeless tobacco: Never Used  . Tobacco comment: only socially  Substance Use Topics  . Alcohol use: No   Objective:   BP 110/60 (BP Location: Left Arm, Patient Position: Sitting, Cuff Size: Normal)   Pulse 65   Temp 97.7 F (36.5 C) (Oral)   Resp 16   Wt 130 lb 9.6 oz (59.2 kg)   SpO2 98%   BMI 22.42 kg/m    Physical Exam  Constitutional: She is oriented to person, place, and time. She appears well-developed and well-nourished. No distress.  Cardiovascular: Normal rate, regular rhythm and normal heart sounds. Exam reveals no gallop and no friction rub.  No murmur heard. Pulmonary/Chest: Effort normal and breath sounds normal. No respiratory distress. She has no wheezes. She has no rales.  Abdominal: Soft. Normal appearance and bowel sounds are normal. She exhibits no distension  and no mass. There is no hepatosplenomegaly. There is tenderness in the suprapubic area. There is no rebound, no guarding and no CVA tenderness.  Neurological: She is alert and oriented to person, place, and time.  Skin: Skin is warm and dry. She is not diaphoretic.       Assessment & Plan:     1. Urinary tract infection with hematuria, site unspecified Worsening symptoms. UA positive. Will treat empirically with Macrobid as below. Pyridium given for spasm. Continue  to push fluids. Urine sent for culture. Will follow up pending C&S results. She is to call if symptoms do not improve or if they worsen.  - POCT urinalysis dipstick - Urine Culture - nitrofurantoin, macrocrystal-monohydrate, (MACROBID) 100 MG capsule; Take 1 capsule (100 mg total) by mouth 2 (two) times daily.  Dispense: 14 capsule; Refill: 0 - phenazopyridine (PYRIDIUM) 100 MG tablet; Take 1 tablet (100 mg total) by mouth 3 (three) times daily as needed for pain.  Dispense: 10 tablet; Refill: 0  2. Benign essential microscopic hematuria Patient does have h/o bladder cancer in family and brother getting ready to undergo kidney biopsy. Advised patient is microscopy is positive for blood or other abnormal findings, we will repeat UA and microscopy following treatment. If still abnormal will refer to Urology. Patient agreeable.  - Urinalysis, microscopic only       Mar Daring, PA-C  [facility]

## 2017-11-05 ENCOUNTER — Telehealth: Payer: Self-pay

## 2017-11-05 LAB — URINALYSIS, MICROSCOPIC ONLY
Casts: NONE SEEN /lpf
EPITHELIAL CELLS (NON RENAL): NONE SEEN /HPF (ref 0–10)
WBC, UA: >30 /hpf — AB (ref 0–?)

## 2017-11-05 NOTE — Telephone Encounter (Signed)
-----   Message from Mar Daring, [place] sent at 11/05/2017 10:11 AM EST ----- There is a lot of RBC and WBC noted in urine microscopy. Would recommend [place] rechecking urine microscopy after treatment to see if this clears as discussed.

## 2017-11-05 NOTE — Telephone Encounter (Signed)
Patient advised as directed below.  Thanks,  -Keta [place] 

## 2017-11-06 ENCOUNTER — Telehealth: Payer: Self-pay

## 2017-11-06 LAB — URINE CULTURE

## 2017-11-06 NOTE — Telephone Encounter (Signed)
Patient advised as below. Patient verbalizes understanding and is in agreement with treatment plan.  

## 2017-11-06 NOTE — Telephone Encounter (Signed)
-----   Message from Mar Daring, PA-C sent at 11/06/2017 11:11 AM EST ----- Urine culture is negative. How are you feeling with antibiotic? Are symptoms improving?

## 2017-11-11 DIAGNOSIS — H2513 Age-related nuclear cataract, bilateral: Secondary | ICD-10-CM | POA: Diagnosis not present

## 2017-11-11 DIAGNOSIS — H524 Presbyopia: Secondary | ICD-10-CM | POA: Diagnosis not present

## 2017-11-26 ENCOUNTER — Ambulatory Visit (INDEPENDENT_AMBULATORY_CARE_PROVIDER_SITE_OTHER): Payer: Medicare HMO

## 2017-11-26 ENCOUNTER — Encounter: Payer: Self-pay | Admitting: Family Medicine

## 2017-11-26 ENCOUNTER — Ambulatory Visit (INDEPENDENT_AMBULATORY_CARE_PROVIDER_SITE_OTHER): Payer: Medicare HMO | Admitting: Family Medicine

## 2017-11-26 VITALS — BP 98/60 | HR 72 | Temp 97.9°F | Ht 64.0 in

## 2017-11-26 VITALS — BP 98/60 | HR 72 | Temp 97.9°F | Resp 15 | Wt 130.5 lb

## 2017-11-26 DIAGNOSIS — E559 Vitamin D deficiency, unspecified: Secondary | ICD-10-CM

## 2017-11-26 DIAGNOSIS — Z0000 Encounter for general adult medical examination without abnormal findings: Secondary | ICD-10-CM

## 2017-11-26 DIAGNOSIS — M810 Age-related osteoporosis without current pathological fracture: Secondary | ICD-10-CM | POA: Diagnosis not present

## 2017-11-26 DIAGNOSIS — E7800 Pure hypercholesterolemia, unspecified: Secondary | ICD-10-CM

## 2017-11-26 LAB — POCT URINALYSIS DIPSTICK
Appearance: NORMAL
Bilirubin, UA: NEGATIVE
Glucose, UA: NEGATIVE
Ketones, UA: NEGATIVE
Leukocytes, UA: NEGATIVE
Nitrite, UA: NEGATIVE
Odor: NORMAL
Protein, UA: NEGATIVE
Spec Grav, UA: 1.02 (ref 1.010–1.025)
Urobilinogen, UA: 0.2 E.U./dL
pH, UA: 6 (ref 5.0–8.0)

## 2017-11-26 NOTE — Progress Notes (Deleted)
Patient: Kristina Jordan, Female    DOB: [birth year], 67 y.o.   MRN: [id number] Visit Date: 11/26/2017  Today's Provider: Vernie Murders, PA   No chief complaint on file.  Subjective:     Complete Physical Kristina Jordan is a 67 y.o. female. She feels {DESC; WELL/FAIRLY WELL/POORLY:18703}. She reports exercising ***. She reports she is sleeping {DESC; WELL/FAIRLY WELL/POORLY:18703}.  -----------------------------------------------------------   Review of Systems  Social History   Socioeconomic History  . Marital status: Married    Spouse name: Not on file  . Number of children: Not on file  . Years of education: Not on file  . Highest education level: Not on file  Social Needs  . Financial resource strain: Not on file  . Food insecurity - worry: Not on file  . Food insecurity - inability: Not on file  . Transportation needs - medical: Not on file  . Transportation needs - non-medical: Not on file  Occupational History  . Not on file  Tobacco Use  . Smoking status: Former Smoker    Types: Cigarettes  . Smokeless tobacco: Never Used  . Tobacco comment: only socially  Substance and Sexual Activity  . Alcohol use: No  . Drug use: No  . Sexual activity: Yes  Other Topics Concern  . Not on file  Social History Narrative  . Not on file    Past Medical History:  Diagnosis Date  . MITRAL INSUFFICIENCY   . MITRAL VALVE PROLAPSE      Patient Active Problem List   Diagnosis Date Noted  . Heme + stool   . Hypercholesterolemia without hypertriglyceridemia 11/19/2015  . Avitaminosis D 11/19/2015  . Atypical chest pain 09/09/2011  . Non-ST elevation MI (NSTEMI) ([place]) 09/09/2011  . 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 MITRAL INSUFFICIENCY 12/26/2009  . Mitral valve disorder 12/24/2009  . FIBRILLATION, ATRIAL 12/24/2009  . Heart disease 11/01/2009  . 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 Acquired trigger finger 01/02/2009  . Need for prophylactic hormone replacement therapy  (postmenopausal) 10/09/2006  . OP (osteoporosis) 10/09/2006    Past Surgical History:  Procedure Laterality Date  . ABDOMINAL HYSTERECTOMY    . BREAST BIOPSY Left    neg  . BREAST BIOPSY Left    stereo- neg  . CERVICAL SPINE SURGERY    . COLONOSCOPY WITH PROPOFOL N/A 12/02/2016   Procedure: COLONOSCOPY WITH PROPOFOL;  Surgeon: [name], MD;  Location: [facility] ENDOSCOPY;  Service: Endoscopy;  Laterality: N/A;  . LEFT HEART CATHETERIZATION WITH CORONARY ANGIOGRAM N/A 09/10/2011   Procedure: LEFT HEART CATHETERIZATION WITH CORONARY ANGIOGRAM;  Surgeon: Wellington Hampshire, MD;  Location: [facility];  Service: Cardiovascular;  Laterality: N/A;    Her family history includes Cancer in her brother and sister; Coronary artery disease in her unknown relative; Heart attack in her mother; Multiple sclerosis in her sister; Thyroid disease in her unknown relative. There is no history of Breast cancer.      Current Outpatient Medications:  .  alendronate (FOSAMAX) 70 MG tablet, TAKE ONE TABLET BY MOUTH EVERY 7 DAYS WITH A FULL GLASS OF WATER ON AN EMPTY STOMACH. DO NOT LIE DOWN UNTIL AFTER BREAKFAST, Disp: 12 tablet, Rfl: 3 .  aspirin 81 MG tablet, Take 81 mg by mouth daily., Disp: , Rfl:  .  Calcium Carb-Cholecalciferol (CALCIUM 600 + D PO), Take 2 capsules by mouth daily. , Disp: , Rfl:  .  Cholecalciferol (VITAMIN D-3) 1000 UNITS CAPS, Take 2,000 Units by  mouth once., Disp: , Rfl:  .  diltiazem (CARDIZEM) 120 MG tablet, TAKE ONE TABLET BY MOUTH ONCE DAILY, Disp: 90 tablet, Rfl: 3 .  nitrofurantoin, macrocrystal-monohydrate, (MACROBID) 100 MG capsule, Take 1 capsule (100 mg total) by mouth 2 (two) times daily., Disp: 14 capsule, Rfl: 0 .  phenazopyridine (PYRIDIUM) 100 MG tablet, Take 1 tablet (100 mg total) by mouth 3 (three) times daily as needed for pain., Disp: 10 tablet, Rfl: 0 .  propranolol (INDERAL) 40 MG tablet, TAKE 1 TABLET BY MOUTH TWICE DAILY, Disp: 180 tablet, Rfl: 0  Patient Care  Team: Chrismon, Vickki Muff, PA as PCP - General (Family Medicine) Minus Breeding, MD as Consulting Physician (Cardiology) Carmon Ginsberg, [place] as Referring Physician (Family Medicine)     Objective:   Vitals: There were no vitals taken for this visit.  Physical Exam  Activities of Daily Living No flowsheet data found.  Fall Risk Assessment Fall Risk  11/07/2016 11/19/2015  Falls in the past year? No No     Depression Screen PHQ 2/9 Scores 11/07/2016 11/19/2015  PHQ - 2 Score 0 0    Cognitive Testing - 6-CIT  Correct? Score   What year is it? {yes no:22349} {0-4:31231} 0 or 4  What month is it? {yes no:22349} {0-3:21082} 0 or 3  Memorize:    Pia Mau,  40,  [address]  [place],      What time is it? (within 1 hour) {yes no:22349} {0-3:21082} 0 or 3  Count backwards from 20 {yes no:22349} {0-4:31231} 0, 2, or 4  Name the months of the year {yes no:22349} {0-4:31231} 0, 2, or 4  Repeat name & address above {yes no:22349} {0-10:5044} 0, 2, 4, 6, 8, or 10       TOTAL SCORE  ***/28   Interpretation:  {normal/abnormal:11317::"Normal"}  Normal (0-7) Abnormal (8-28)       Assessment & Plan:    Annual Physical Reviewed patient's Family Medical History Reviewed and updated list of patient's medical providers Assessment of cognitive impairment was done Assessed patient's functional ability Established a written schedule for health screening [place] Completed and Reviewed  Exercise Activities and Dietary recommendations Goals    None      Immunization History  Administered Date(s) Administered  . H1N1 10/12/2008  . Influenza, High Dose Seasonal PF 07/31/2014, 08/02/2015, 07/18/2016, 08/04/2017  . Pneumococcal Conjugate-13 11/17/2014  . Pneumococcal Polysaccharide-23 06/02/2011, 11/07/2016  . Tdap 10/21/2010  . Zoster 10/27/2011    Health Maintenance  Topic Date Due  . MAMMOGRAM  08/15/2019  . TETANUS/TDAP  10/21/2020  . COLONOSCOPY   12/03/2026  . INFLUENZA VACCINE  Completed  . DEXA SCAN  Completed  . Hepatitis C Screening  Completed  . PNA vac Low Risk Adult  Completed     Discussed health benefits of physical activity, and encouraged her to engage in regular exercise appropriate for her age and condition.    ------------------------------------------------------------------------------------------------------------    Vernie Murders, [place]

## 2017-11-26 NOTE — Patient Instructions (Signed)
Kristina Jordan , Thank you for taking time to come for your Medicare Wellness Visit. I appreciate your ongoing commitment to your health goals. Please review the following plan we discussed and let me know if I can assist you in the future.   Screening recommendations/referrals: Colonoscopy: Up to date Mammogram: Up to date Bone Density: Up to date Recommended yearly ophthalmology/optometry visit for glaucoma screening and checkup Recommended yearly dental visit for hygiene and checkup  Vaccinations: Influenza vaccine: Up to date Pneumococcal vaccine: Up to date Tdap vaccine: Up to date Shingles vaccine: Pt declines today.     Advanced directives: Please bring a copy of your POA (Power of Attorney) and/or Living Will to your next appointment.   Conditions/risks identified: Recommend increasing water intake to 4-6 glasses a day.   Next appointment: 10:00 AM today with Vernie Murders.   Preventive Care 69 Years and Older, Female Preventive care refers to lifestyle choices and visits with your health care provider that can promote health and wellness. What does preventive care include?  A yearly physical exam. This is also called an annual well check.  Dental exams once or twice a year.  Routine eye exams. Ask your health care provider how often you should have your eyes checked.  Personal lifestyle choices, including:  Daily care of your teeth and gums.  Regular physical activity.  Eating a healthy diet.  Avoiding tobacco and drug use.  Limiting alcohol use.  Practicing safe sex.  Taking low-dose aspirin every day.  Taking vitamin and mineral supplements as recommended by your health care provider. What happens during an annual well check? The services and screenings done by your health care provider during your annual well check will depend on your age, overall health, lifestyle risk factors, and family history of disease. Counseling  Your health care provider may ask  you questions about your:  Alcohol use.  Tobacco use.  Drug use.  Emotional well-being.  Home and relationship well-being.  Sexual activity.  Eating habits.  History of falls.  Memory and ability to understand (cognition).  Work and work Statistician.  Reproductive health. Screening  You may have the following tests or measurements:  Height, weight, and BMI.  Blood pressure.  Lipid and cholesterol levels. These may be checked every 5 years, or more frequently if you are over 50 years old.  Skin check.  Lung cancer screening. You may have this screening every year starting at age 42 if you have a 30-pack-year history of smoking and currently smoke or have quit within the past 15 years.  Fecal occult blood test (FOBT) of the stool. You may have this test every year starting at age 98.  Flexible sigmoidoscopy or colonoscopy. You may have a sigmoidoscopy every 5 years or a colonoscopy every 10 years starting at age 66.  Hepatitis C blood test.  Hepatitis B blood test.  Sexually transmitted disease (STD) testing.  Diabetes screening. This is done by checking your blood sugar (glucose) after you have not eaten for a while (fasting). You may have this done every 1-3 years.  Bone density scan. This is done to screen for osteoporosis. You may have this done starting at age 66.  Mammogram. This may be done every 1-2 years. Talk to your health care provider about how often you should have regular mammograms. Talk with your health care provider about your test results, treatment options, and if necessary, the need for more tests. Vaccines  Your health care provider may recommend certain  vaccines, such as:  Influenza vaccine. This is recommended every year.  Tetanus, diphtheria, and acellular pertussis (Tdap, Td) vaccine. You may need a Td booster every 10 years.  Zoster vaccine. You may need this after age 43.  Pneumococcal 13-valent conjugate (PCV13) vaccine. One dose  is recommended after age 69.  Pneumococcal polysaccharide (PPSV23) vaccine. One dose is recommended after age 17. Talk to your health care provider about which screenings and vaccines you need and how often you need them. This information is not intended to replace advice given to you by your health care provider. Make sure you discuss any questions you have with your health care provider. Document Released: 09/28/2015 Document Revised: 05/21/2016 Document Reviewed: 07/03/2015 Elsevier Interactive Patient Education  2017 [employer] Prevention in the Home Falls can cause injuries. They can happen to people of all ages. There are many things you can do to make your home safe and to help prevent falls. What can I do on the outside of my home?  Regularly fix the edges of walkways and driveways and fix any cracks.  Remove anything that might make you trip as you walk through a door, such as a raised step or threshold.  Trim any bushes or trees on the path to your home.  Use bright outdoor lighting.  Clear any walking paths of anything that might make someone trip, such as rocks or tools.  Regularly check to see if handrails are loose or broken. Make sure that both sides of any steps have handrails.  Any raised decks and porches should have guardrails on the edges.  Have any leaves, snow, or ice cleared regularly.  Use sand or salt on walking paths during winter.  Clean up any spills in your garage right away. This includes oil or grease spills. What can I do in the bathroom?  Use night lights.  Install grab bars by the toilet and in the tub and shower. Do not use towel bars as grab bars.  Use non-skid mats or decals in the tub or shower.  If you need to sit down in the shower, use a plastic, non-slip stool.  Keep the floor dry. Clean up any water that spills on the floor as soon as it happens.  Remove soap buildup in the tub or shower regularly.  Attach bath mats  securely with double-sided non-slip rug tape.  Do not have throw rugs and other things on the floor that can make you trip. What can I do in the bedroom?  Use night lights.  Make sure that you have a light by your bed that is easy to reach.  Do not use any sheets or blankets that are too big for your bed. They should not hang down onto the floor.  Have a firm chair that has side arms. You can use this for support while you get dressed.  Do not have throw rugs and other things on the floor that can make you trip. What can I do in the kitchen?  Clean up any spills right away.  Avoid walking on wet floors.  Keep items that you use a lot in easy-to-reach places.  If you need to reach something above you, use a strong step stool that has a grab bar.  Keep electrical cords out of the way.  Do not use floor polish or wax that makes floors slippery. If you must use wax, use non-skid floor wax.  Do not have throw rugs and other things  on the floor that can make you trip. What can I do with my stairs?  Do not leave any items on the stairs.  Make sure that there are handrails on both sides of the stairs and use them. Fix handrails that are broken or loose. Make sure that handrails are as long as the stairways.  Check any carpeting to make sure that it is firmly attached to the stairs. Fix any carpet that is loose or worn.  Avoid having throw rugs at the top or bottom of the stairs. If you do have throw rugs, attach them to the floor with carpet tape.  Make sure that you have a light switch at the top of the stairs and the bottom of the stairs. If you do not have them, ask someone to add them for you. What else can I do to help prevent falls?  Wear shoes that:  Do not have high heels.  Have rubber bottoms.  Are comfortable and fit you well.  Are closed at the toe. Do not wear sandals.  If you use a stepladder:  Make sure that it is fully opened. Do not climb a closed  stepladder.  Make sure that both sides of the stepladder are locked into place.  Ask someone to hold it for you, if possible.  Clearly mark and make sure that you can see:  Any grab bars or handrails.  First and last steps.  Where the edge of each step is.  Use tools that help you move around (mobility aids) if they are needed. These include:  Canes.  Walkers.  Scooters.  Crutches.  Turn on the lights when you go into a dark area. Replace any light bulbs as soon as they burn out.  Set up your furniture so you have a clear path. Avoid moving your furniture around.  If any of your floors are uneven, fix them.  If there are any pets around you, be aware of where they are.  Review your medicines with your doctor. Some medicines can make you feel dizzy. This can increase your chance of falling. Ask your doctor what other things that you can do to help prevent falls. This information is not intended to replace advice given to you by your health care provider. Make sure you discuss any questions you have with your health care provider. Document Released: 06/28/2009 Document Revised: 02/07/2016 Document Reviewed: 10/06/2014 Elsevier Interactive Patient Education  2017 [employer].

## 2017-11-26 NOTE — Progress Notes (Addendum)
Subjective:   Kristina Jordan is a 67 y.o. female who presents for Medicare Annual (Subsequent) preventive examination.  Review of Systems:  N/A  Cardiac Risk Factors include: advanced age (>54men, >15 women);dyslipidemia;hypertension     Objective:     Vitals: BP 98/60 (BP Location: Left Arm)   Pulse 72   Temp 97.9 F (36.6 C) (Oral)   Ht 5\' 4"  (1.626 m)   BMI 22.42 kg/m   Body mass index is 22.42 kg/m.  Advanced Directives 11/26/2017 12/02/2016 11/07/2016 09/10/2011 09/09/2011  Does Patient Have a Medical Advance Directive? Yes Yes Yes Patient does not have advance directive Patient would like information  Type of Advance Directive Living will Living will Out of facility DNR (pink MOST or yellow form) - -  Would patient like information on creating a medical advance directive? - - Yes (ED - Information included in AVS) Advance directive packet given Referral made to social work  Pre-existing out of facility DNR order (yellow form or pink MOST form) - - - No No    Tobacco Social History   Tobacco Use  Smoking Status Former Smoker  . Types: Cigarettes  Smokeless Tobacco Never Used  Tobacco Comment   only socially     Counseling given: Not Answered Comment: only socially   Clinical Intake:  Pre-visit preparation completed: Yes  Pain : No/denies pain Pain Score: 0-No pain     Nutritional Status: BMI of 19-24  Normal Nutritional Risks: None Diabetes: No  How often do you need to have someone help you when you read instructions, pamphlets, or other written materials from your doctor or pharmacy?: 1 - Never  Interpreter Needed?: No  Information entered by :: [facility], LPN  Past Medical History:  Diagnosis Date  . Hyperlipidemia   . MITRAL INSUFFICIENCY   . MITRAL VALVE PROLAPSE    Past Surgical History:  Procedure Laterality Date  . ABDOMINAL HYSTERECTOMY    . BREAST BIOPSY Left    neg  . BREAST BIOPSY Left    stereo- neg  . CERVICAL SPINE SURGERY     . COLONOSCOPY WITH PROPOFOL N/A 12/02/2016   Procedure: COLONOSCOPY WITH PROPOFOL;  Surgeon: [name], MD;  Location: [facility] ENDOSCOPY;  Service: Endoscopy;  Laterality: N/A;  . LEFT HEART CATHETERIZATION WITH CORONARY ANGIOGRAM N/A 09/10/2011   Procedure: LEFT HEART CATHETERIZATION WITH CORONARY ANGIOGRAM;  Surgeon: Wellington Hampshire, MD;  Location: [facility];  Service: Cardiovascular;  Laterality: N/A;   Family History  Problem Relation Age of Onset  . Multiple sclerosis Sister   . Cancer Brother        bladder  . Heart attack Mother   . Cancer Sister        bladder  . Coronary artery disease Unknown   . Thyroid disease Unknown   . Breast cancer Neg Hx    Social History   Socioeconomic History  . Marital status: Married    Spouse name: None  . Number of children: 2  . Years of education: None  . Highest education level: 12th grade  Social Needs  . Financial resource strain: Not hard at all  . Food insecurity - worry: Never true  . Food insecurity - inability: Never true  . Transportation needs - medical: No  . Transportation needs - non-medical: No  Occupational History  . None  Tobacco Use  . Smoking status: Former Smoker    Types: Cigarettes  . Smokeless tobacco: Never Used  . Tobacco comment: only  socially  Substance and Sexual Activity  . Alcohol use: No  . Drug use: No  . Sexual activity: Yes  Other Topics Concern  . None  Social History Narrative  . None    Outpatient Encounter Medications as of 11/26/2017  Medication Sig  . alendronate (FOSAMAX) 70 MG tablet TAKE ONE TABLET BY MOUTH EVERY 7 DAYS WITH A FULL GLASS OF WATER ON AN EMPTY STOMACH. DO NOT LIE DOWN UNTIL AFTER BREAKFAST  . aspirin 81 MG tablet Take 81 mg by mouth daily.  . Calcium Carb-Cholecalciferol (CALCIUM 600 + D PO) Take 2 capsules by mouth daily.   . Cholecalciferol (VITAMIN D-3) 1000 UNITS CAPS Take 2,000 Units by mouth once.  . diltiazem (CARDIZEM) 120 MG tablet TAKE ONE TABLET BY  MOUTH ONCE DAILY  . propranolol (INDERAL) 40 MG tablet TAKE 1 TABLET BY MOUTH TWICE DAILY  . nitrofurantoin, macrocrystal-monohydrate, (MACROBID) 100 MG capsule Take 1 capsule (100 mg total) by mouth 2 (two) times daily. (Patient not taking: Reported on 11/26/2017)  . phenazopyridine (PYRIDIUM) 100 MG tablet Take 1 tablet (100 mg total) by mouth 3 (three) times daily as needed for pain. (Patient not taking: Reported on 11/26/2017)   No facility-administered encounter medications on file as of 11/26/2017.     Activities of Daily Living In your present state of health, do you have any difficulty performing the following activities: 11/26/2017  Hearing? N  Vision? N  Difficulty concentrating or making decisions? N  Walking or climbing stairs? N  Dressing or bathing? N  Doing errands, shopping? N  Preparing Food and eating ? N  Using the Toilet? N  In the past six months, have you accidently leaked urine? Y  Comment Occasionally- wears protection as needed.  Do you have problems with loss of bowel control? N  Managing your Medications? N  Managing your Finances? N  Housekeeping or managing your Housekeeping? N  Some recent data might be hidden    Patient Care Team: Chrismon, Vickki Muff, PA as PCP - General (Family Medicine) Minus Breeding, MD as Consulting Physician (Cardiology) Carmon Ginsberg, [place] as Referring Physician (Family Medicine) Estill Cotta, MD as Consulting Physician (Ophthalmology)    Assessment:   This is a routine wellness examination for [name].  Exercise Activities and Dietary recommendations Current Exercise Habits: Home exercise routine, Type of exercise: Other - see comments;walking(rides a stationary bike), Time (Minutes): 30, Frequency (Times/Week): 3, Weekly Exercise (Minutes/Week): 90, Intensity: Mild  Goals    . DIET - INCREASE WATER INTAKE     Recommend increasing water intake to 4-6 glasses a day.        Fall Risk Fall Risk  11/26/2017 11/07/2016  11/19/2015  Falls in the past year? No No No   Is the patient's home free of loose throw rugs in walkways, pet beds, electrical cords, etc?   yes      Grab bars in the bathroom? yes      Handrails on the stairs?   yes      Adequate lighting?   yes  Timed Get Up and Go performed: N/A  Depression Screen PHQ 2/9 Scores 11/26/2017 11/07/2016 11/19/2015  PHQ - 2 Score 1 0 0  PHQ- 9 Score 4 - -     Cognitive Function: Pt declined screening today.      6CIT Screen 11/07/2016  What Year? 0 points  What month? 0 points  What time? 0 points  Count back from 20 0 points  Months in reverse  0 points  Repeat phrase 4 points  Total Score 4    Immunization History  Administered Date(s) Administered  . H1N1 10/12/2008  . Influenza, High Dose Seasonal PF 07/31/2014, 08/02/2015, 07/18/2016, 08/04/2017  . Pneumococcal Conjugate-13 11/17/2014  . Pneumococcal Polysaccharide-23 06/02/2011, 11/07/2016  . Tdap 10/21/2010  . Zoster 10/27/2011    Qualifies for Shingles Vaccine? Due for Shingles vaccine. Declined my offer to administer today. Education has been provided regarding the importance of this vaccine. Pt has been advised to call her insurance company to determine her out of pocket expense. Advised she may also receive this vaccine at her local pharmacy or Health Dept. Verbalized acceptance and understanding.  Screening Tests Health Maintenance  Topic Date Due  . MAMMOGRAM  08/15/2019  . TETANUS/TDAP  10/21/2020  . COLONOSCOPY  12/03/2026  . INFLUENZA VACCINE  Completed  . DEXA SCAN  Completed  . Hepatitis C Screening  Completed  . PNA vac Low Risk Adult  Completed    Cancer Screenings: Lung: Low Dose CT Chest recommended if Age 69-80 years, 30 pack-year currently smoking OR have quit w/in 15years. Patient does not qualify. Breast:  Up to date on Mammogram? Yes   Up to date of Bone Density/Dexa? Yes Colorectal: Up to date  Additional Screenings:  Hepatitis C Screening: Up to  date     Plan:  I have personally reviewed and addressed the Medicare Annual Wellness questionnaire and have noted the following in the patient's chart:  A. Medical and social history B. Use of alcohol, tobacco or illicit drugs  C. Current medications and supplements D. Functional ability and status E.  Nutritional status F.  Physical activity G. Advance directives H. List of other physicians I.  Hospitalizations, surgeries, and ER visits in previous 12 months J.  [place] such as hearing and vision if needed, cognitive and depression L. Referrals and appointments - none  In addition, I have reviewed and discussed with patient certain preventive protocols, quality metrics, and best practice recommendations. A written personalized care plan for preventive services as well as general preventive health recommendations were provided to patient.  See attached scanned questionnaire for additional information.   Signed,  Fabio Neighbors, LPN Nurse Health Advisor   Nurse Recommendations: None.   Reviewed documentation and recommendations of the Nurse Health Advisor. Was available for consultation and agree with recommendations. Will review with the patient at the next office visit.

## 2017-11-26 NOTE — Progress Notes (Signed)
Patient ID: Kristina Jordan, female   DOB: Nov 10, 1950, 67 y.o.   MRN: [id number]       Patient: Kristina Jordan, Female    DOB: 02/18/1951, 67 y.o.   MRN: [id number] Visit Date: 11/26/2017  Today's Provider: Vernie Murders, PA   Chief Complaint  Patient presents with  . Medicare Wellness   Subjective:    Annual wellness visit Kristina Jordan is a 67 y.o. female. She feels well. She reports exercising 3x a week . She reports she is sleeping fairly well.  -----------------------------------------------------------   Review of Systems  Constitutional: Negative.   HENT: Positive for rhinorrhea and sneezing.   Eyes: Negative.   Respiratory: Negative.   Cardiovascular: Positive for palpitations.  Gastrointestinal: Positive for anal bleeding, blood in stool, constipation and diarrhea.  Endocrine: Negative.   Genitourinary: Negative.   Musculoskeletal: Negative.   Skin: Negative.   Allergic/Immunologic: Negative.   Neurological: Negative.   Hematological: Negative.   Psychiatric/Behavioral: Negative.    Social History   Socioeconomic History  . Marital status: Married    Spouse name: Not on file  . Number of children: 2  . Years of education: Not on file  . Highest education level: 12th grade  Social Needs  . Financial resource strain: Not hard at all  . Food insecurity - worry: Never true  . Food insecurity - inability: Never true  . Transportation needs - medical: No  . Transportation needs - non-medical: No  Occupational History  . Not on file  Tobacco Use  . Smoking status: Former Smoker    Types: Cigarettes  . Smokeless tobacco: Never Used  . Tobacco comment: only socially  Substance and Sexual Activity  . Alcohol use: No  . Drug use: No  . Sexual activity: Yes  Other Topics Concern  . Not on file  Social History Narrative  . Not on file   Past Medical History:  Diagnosis Date  . Hyperlipidemia   . MITRAL INSUFFICIENCY   . MITRAL VALVE PROLAPSE       Patient Active Problem List   Diagnosis Date Noted  . Heme + stool   . Hypercholesterolemia without hypertriglyceridemia 11/19/2015  . Avitaminosis D 11/19/2015  . Atypical chest pain 09/09/2011  . Non-ST elevation MI (NSTEMI) ([place]) 09/09/2011  . 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 MITRAL INSUFFICIENCY 12/26/2009  . Mitral valve disorder 12/24/2009  . FIBRILLATION, ATRIAL 12/24/2009  . Heart disease 11/01/2009  . 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 Acquired trigger finger 01/02/2009  . Need for prophylactic hormone replacement therapy (postmenopausal) 10/09/2006  . OP (osteoporosis) 10/09/2006   Past Surgical History:  Procedure Laterality Date  . ABDOMINAL HYSTERECTOMY    . BREAST BIOPSY Left    neg  . BREAST BIOPSY Left    stereo- neg  . CERVICAL SPINE SURGERY    . COLONOSCOPY WITH PROPOFOL N/A 12/02/2016   Procedure: COLONOSCOPY WITH PROPOFOL;  Surgeon: [name], MD;  Location: [facility] ENDOSCOPY;  Service: Endoscopy;  Laterality: N/A;  . LEFT HEART CATHETERIZATION WITH CORONARY ANGIOGRAM N/A 09/10/2011   Procedure: LEFT HEART CATHETERIZATION WITH CORONARY ANGIOGRAM;  Surgeon: Wellington Hampshire, MD;  Location: [place] CATH LAB;  Service: Cardiovascular;  Laterality: N/A;    Her family history includes Cancer in her brother and sister; Coronary artery disease in her unknown relative; Heart attack in her mother; Multiple sclerosis in her sister; Thyroid disease in her unknown relative. There is no history of Breast cancer.      Current Outpatient  Medications:  .  alendronate (FOSAMAX) 70 MG tablet, TAKE ONE TABLET BY MOUTH EVERY 7 DAYS WITH A FULL GLASS OF WATER ON AN EMPTY STOMACH. DO NOT LIE DOWN UNTIL AFTER BREAKFAST, Disp: 12 tablet, Rfl: 3 .  aspirin 81 MG tablet, Take 81 mg by mouth daily., Disp: , Rfl:  .  Calcium Carb-Cholecalciferol (CALCIUM 600 + D PO), Take 2 capsules by mouth daily. , Disp: , Rfl:  .  Cholecalciferol (VITAMIN D-3) 1000 UNITS CAPS, Take 2,000 Units by mouth once., Disp:  , Rfl:  .  diltiazem (CARDIZEM) 120 MG tablet, TAKE ONE TABLET BY MOUTH ONCE DAILY, Disp: 90 tablet, Rfl: 3 .  nitrofurantoin, macrocrystal-monohydrate, (MACROBID) 100 MG capsule, Take 1 capsule (100 mg total) by mouth 2 (two) times daily., Disp: 14 capsule, Rfl: 0 .  phenazopyridine (PYRIDIUM) 100 MG tablet, Take 1 tablet (100 mg total) by mouth 3 (three) times daily as needed for pain., Disp: 10 tablet, Rfl: 0 .  propranolol (INDERAL) 40 MG tablet, TAKE 1 TABLET BY MOUTH TWICE DAILY, Disp: 180 tablet, Rfl: 0  Patient Care Team: Chrismon, Vickki Muff, PA as PCP - General (Family Medicine) Minus Breeding, MD as Consulting Physician (Cardiology) Carmon Ginsberg, [place] as Referring Physician (Family Medicine) Dingeldein, Remo Lipps, MD as Consulting Physician (Ophthalmology)     Objective:   Vitals: BP 98/60   Pulse 72   Temp 97.9 F (36.6 C) (Oral)   Resp 15   Wt 130 lb 8 oz (59.2 kg) Comment: patient reports  BMI 22.40 kg/m   Physical Exam  Constitutional: She is oriented to person, place, and time. She appears well-developed and well-nourished.  HENT:  Head: Normocephalic and atraumatic.  Right Ear: External ear normal.  Left Ear: External ear normal.  Nose: Nose normal.  Mouth/Throat: Oropharynx is clear and moist.  Eyes: Conjunctivae and EOM are normal. Pupils are equal, round, and reactive to light. Right eye exhibits no discharge.  Neck: Normal range of motion. Neck supple. No tracheal deviation present. No thyromegaly present.  Cardiovascular: Normal rate, regular rhythm, normal heart sounds and intact distal pulses.  No murmur heard. Pulmonary/Chest: Effort normal and breath sounds normal. No respiratory distress. She has no wheezes. She has no rales. She exhibits no tenderness.  Abdominal: Soft. She exhibits no distension and no mass. There is no tenderness. There is no rebound and no guarding.  Genitourinary:  Genitourinary Comments: Hysterectomy in 1995 due to fibroids with  DUB.  Musculoskeletal: Normal range of motion. She exhibits no edema or tenderness.  Lymphadenopathy:    She has no cervical adenopathy.  Neurological: She is alert and oriented to person, place, and time. She has normal reflexes. No cranial nerve deficit. She exhibits normal muscle tone. Coordination normal.  Skin: Skin is warm and dry. No rash noted. No erythema.  Psychiatric: She has a normal mood and affect. Her behavior is normal. Judgment and thought content normal.    Activities of Daily Living In your present state of health, do you have any difficulty performing the following activities: 11/26/2017  Hearing? N  Vision? N  Difficulty concentrating or making decisions? N  Walking or climbing stairs? N  Dressing or bathing? N  Doing errands, shopping? N  Preparing Food and eating ? N  Using the Toilet? N  In the past six months, have you accidently leaked urine? Y  Comment Occasionally- wears protection as needed.  Do you have problems with loss of bowel control? N  Managing your Medications? N  Managing your Finances? N  Housekeeping or managing your Housekeeping? N  Some recent data might be hidden    Fall Risk Assessment Fall Risk  11/26/2017 11/07/2016 11/19/2015  Falls in the past year? No No No     Depression Screen PHQ 2/9 Scores 11/26/2017 11/07/2016 11/19/2015  PHQ - 2 Score 1 0 0  PHQ- 9 Score 4 - -    Cognitive Testing - 6-CIT  Correct? Score   What year is it? yes 0 0 or 4  What month is it? yes 0 0 or 3  Memorize:    Pia Mau,  40,  [address],  [place],      What time is it? (within 1 hour) yes 0 0 or 3  Count backwards from 20 yes 0 0, 2, or 4  Name the months of the year yes 0 0, 2, or 4  Repeat name & address above yes 0 0, 2, 4, 6, 8, or 10       TOTAL SCORE  0/28   Interpretation:  Normal  Normal (0-7) Abnormal (8-28)    Assessment & Plan:     Annual Wellness Visit  Reviewed patient's Family Medical History Reviewed and updated list of  patient's medical providers Assessment of cognitive impairment was done Assessed patient's functional ability Established a written schedule for health screening [place] Completed and Reviewed  Exercise Activities and Dietary recommendations Goals    . DIET - INCREASE WATER INTAKE     Recommend increasing water intake to 4-6 glasses a day.        Immunization History  Administered Date(s) Administered  . H1N1 10/12/2008  . Influenza, High Dose Seasonal PF 07/31/2014, 08/02/2015, 07/18/2016, 08/04/2017  . Pneumococcal Conjugate-13 11/17/2014  . Pneumococcal Polysaccharide-23 06/02/2011, 11/07/2016  . Tdap 10/21/2010  . Zoster 10/27/2011    Health Maintenance  Topic Date Due  . MAMMOGRAM  08/15/2019  . TETANUS/TDAP  10/21/2020  . COLONOSCOPY  12/03/2026  . INFLUENZA VACCINE  Completed  . DEXA SCAN  Completed  . Hepatitis C Screening  Completed  . PNA vac Low Risk Adult  Completed     Discussed health benefits of physical activity, and encouraged her to engage in regular exercise appropriate for her age and condition.    ------------------------------------------------------------------------------------------------------------ 1. Annual physical exam General exam normal. Last 3 annual mammograms normal - next due 08-14-19. Colonoscopy on 12-02-16 showed non-bleeding hemorrhoids and a few diverticula - next exam due in 2028. Immunizations up to date. Had a UTI  Treated 11-04-17 and no longer having any symptoms. UA clear except for a small trace of RBC's. Encouraged to drink extra fluids to flush urinary tract out. Given anticipatory guidance. - POCT urinalysis dipstick  2. Osteoporosis, unspecified osteoporosis type, unspecified pathological fracture presence Tolerating Fosamax 70 mg once a week. Recheck CMP, Vitamin-D and BMD. No joint pains or recent falls. - Comprehensive metabolic panel - VITAMIN D 25 Hydroxy (Vit-D Deficiency, Fractures) - DG  Bone Density  3. Avitaminosis D History of osteoporosis and taking Calcium with Vitamin-D supplement daily. Will check labs. - CBC with Differential/Platelet - VITAMIN D 25 Hydroxy (Vit-D Deficiency, Fractures)  4. Hypercholesterolemia without hypertriglyceridemia Controlled by diet. Encouraged to drink extra fluids and recheck labs. - Comprehensive metabolic panel - Lipid panel - TSH    Vernie Murders, PA  [facility]

## 2017-11-27 LAB — COMPREHENSIVE METABOLIC PANEL
ALBUMIN: 4.5 g/dL (ref 3.6–4.8)
ALT: 14 IU/L (ref 0–32)
AST: 19 IU/L (ref 0–40)
Albumin/Globulin Ratio: 1.6 (ref 1.2–2.2)
Alkaline Phosphatase: 74 IU/L (ref 39–117)
BUN/Creatinine Ratio: 17 (ref 12–28)
BUN: 13 mg/dL (ref 8–27)
Bilirubin Total: 0.3 mg/dL (ref 0.0–1.2)
CALCIUM: 9.5 mg/dL (ref 8.7–10.3)
CO2: 24 mmol/L (ref 20–29)
CREATININE: 0.76 mg/dL (ref 0.57–1.00)
Chloride: 105 mmol/L (ref 96–106)
GFR, EST AFRICAN AMERICAN: 93 mL/min/{1.73_m2} (ref >59)
GFR, EST NON AFRICAN AMERICAN: 81 mL/min/{1.73_m2} (ref >59)
GLOBULIN, TOTAL: 2.9 g/dL (ref 1.5–4.5)
Glucose: 89 mg/dL (ref 65–99)
Potassium: 4 mmol/L (ref 3.5–5.2)
SODIUM: 142 mmol/L (ref 134–144)
TOTAL PROTEIN: 7.4 g/dL (ref 6.0–8.5)

## 2017-11-27 LAB — CBC WITH DIFFERENTIAL/PLATELET
BASOS: 0 %
Basophils Absolute: 0 10*3/uL (ref 0.0–0.2)
EOS (ABSOLUTE): 0.3 10*3/uL (ref 0.0–0.4)
EOS: 5 %
HEMATOCRIT: 39.2 % (ref 34.0–46.6)
HEMOGLOBIN: 12.7 g/dL (ref 11.1–15.9)
Immature Grans (Abs): 0 10*3/uL (ref 0.0–0.1)
Immature Granulocytes: 0 %
LYMPHS ABS: 1.4 10*3/uL (ref 0.7–3.1)
Lymphs: 26 %
MCH: 27.4 pg (ref 26.6–33.0)
MCHC: 32.4 g/dL (ref 31.5–35.7)
MCV: 85 fL (ref 79–97)
MONOCYTES: 7 %
Monocytes Absolute: 0.4 10*3/uL (ref 0.1–0.9)
NEUTROS ABS: 3.4 10*3/uL (ref 1.4–7.0)
Neutrophils: 62 %
Platelets: 254 10*3/uL (ref 150–379)
RBC: 4.63 x10E6/uL (ref 3.77–5.28)
RDW: 13.8 % (ref 12.3–15.4)
WBC: 5.5 10*3/uL (ref 3.4–10.8)

## 2017-11-27 LAB — TSH: TSH: 1.21 u[IU]/mL (ref 0.450–4.500)

## 2017-11-27 LAB — LIPID PANEL
CHOL/HDL RATIO: 2 ratio (ref 0.0–4.4)
Cholesterol, Total: 161 mg/dL (ref 100–199)
HDL: 79 mg/dL (ref >39)
LDL CALC: 66 mg/dL (ref 0–99)
Triglycerides: 79 mg/dL (ref 0–149)
VLDL Cholesterol Cal: 16 mg/dL (ref 5–40)

## 2017-11-27 LAB — VITAMIN D 25 HYDROXY (VIT D DEFICIENCY, FRACTURES): Vit D, 25-Hydroxy: 40.1 ng/mL (ref 30.0–100.0)

## 2017-12-28 ENCOUNTER — Other Ambulatory Visit: Payer: Medicare HMO

## 2017-12-28 DIAGNOSIS — R69 Illness, unspecified: Secondary | ICD-10-CM | POA: Diagnosis not present

## 2018-01-04 ENCOUNTER — Ambulatory Visit
Admission: RE | Admit: 2018-01-04 | Discharge: 2018-01-04 | Disposition: A | Discharge status: Home / Self Care | Payer: Medicare HMO | Source: Ambulatory Visit | Attending: Family Medicine | Admitting: Family Medicine

## 2018-01-04 DIAGNOSIS — Z780 Asymptomatic menopausal state: Secondary | ICD-10-CM | POA: Diagnosis not present

## 2018-01-04 DIAGNOSIS — M8589 Other specified disorders of bone density and structure, multiple sites: Secondary | ICD-10-CM | POA: Diagnosis not present

## 2018-01-04 DIAGNOSIS — M810 Age-related osteoporosis without current pathological fracture: Secondary | ICD-10-CM | POA: Diagnosis present

## 2018-01-15 ENCOUNTER — Other Ambulatory Visit: Payer: Self-pay | Admitting: Family Medicine

## 2018-02-04 ENCOUNTER — Emergency Department (HOSPITAL_COMMUNITY)
Admission: EM | Admit: 2018-02-04 | Discharge: 2018-02-04 | Disposition: A | Discharge status: Home / Self Care | Payer: No Typology Code available for payment source | Attending: Emergency Medicine | Admitting: Emergency Medicine

## 2018-02-04 ENCOUNTER — Emergency Department (HOSPITAL_COMMUNITY): Payer: No Typology Code available for payment source

## 2018-02-04 ENCOUNTER — Encounter (HOSPITAL_COMMUNITY): Payer: Self-pay

## 2018-02-04 DIAGNOSIS — Y999 Unspecified external cause status: Secondary | ICD-10-CM | POA: Diagnosis not present

## 2018-02-04 DIAGNOSIS — S9031XA Contusion of right foot, initial encounter: Secondary | ICD-10-CM | POA: Diagnosis not present

## 2018-02-04 DIAGNOSIS — Y9389 Activity, other specified: Secondary | ICD-10-CM | POA: Diagnosis not present

## 2018-02-04 DIAGNOSIS — Y92410 Unspecified street and highway as the place of occurrence of the external cause: Secondary | ICD-10-CM | POA: Diagnosis not present

## 2018-02-04 DIAGNOSIS — Z87891 Personal history of nicotine dependence: Secondary | ICD-10-CM | POA: Insufficient documentation

## 2018-02-04 DIAGNOSIS — S299XXA Unspecified injury of thorax, initial encounter: Secondary | ICD-10-CM | POA: Diagnosis present

## 2018-02-04 DIAGNOSIS — R079 Chest pain, unspecified: Secondary | ICD-10-CM | POA: Diagnosis not present

## 2018-02-04 DIAGNOSIS — S9032XA Contusion of left foot, initial encounter: Secondary | ICD-10-CM | POA: Diagnosis not present

## 2018-02-04 DIAGNOSIS — Z79899 Other long term (current) drug therapy: Secondary | ICD-10-CM | POA: Diagnosis not present

## 2018-02-04 DIAGNOSIS — Z7982 Long term (current) use of aspirin: Secondary | ICD-10-CM | POA: Diagnosis not present

## 2018-02-04 DIAGNOSIS — S20219A Contusion of unspecified front wall of thorax, initial encounter: Secondary | ICD-10-CM

## 2018-02-04 DIAGNOSIS — S20212A Contusion of left front wall of thorax, initial encounter: Secondary | ICD-10-CM | POA: Diagnosis not present

## 2018-02-04 NOTE — Discharge Instructions (Signed)
Take Tylenol as needed for pain. Take this medicine with food. Use ice on bruised areas. Use for 20 minutes several times a day. Switch to heat after 3 days. Return for worsening symptoms
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
## 2018-02-10 DIAGNOSIS — J302 Other seasonal allergic rhinitis: Secondary | ICD-10-CM | POA: Diagnosis not present

## 2018-02-10 DIAGNOSIS — I471 Supraventricular tachycardia: Secondary | ICD-10-CM | POA: Diagnosis not present

## 2018-02-10 DIAGNOSIS — I4891 Unspecified atrial fibrillation: Secondary | ICD-10-CM | POA: Diagnosis not present

## 2018-02-10 DIAGNOSIS — Z7722 Contact with and (suspected) exposure to environmental tobacco smoke (acute) (chronic): Secondary | ICD-10-CM | POA: Diagnosis not present

## 2018-02-10 DIAGNOSIS — Z7982 Long term (current) use of aspirin: Secondary | ICD-10-CM | POA: Diagnosis not present

## 2018-02-10 DIAGNOSIS — R32 Unspecified urinary incontinence: Secondary | ICD-10-CM | POA: Diagnosis not present

## 2018-02-10 DIAGNOSIS — K08409 Partial loss of teeth, unspecified cause, unspecified class: Secondary | ICD-10-CM | POA: Diagnosis not present

## 2018-02-10 DIAGNOSIS — Z7983 Long term (current) use of bisphosphonates: Secondary | ICD-10-CM | POA: Diagnosis not present

## 2018-02-10 DIAGNOSIS — I252 Old myocardial infarction: Secondary | ICD-10-CM | POA: Diagnosis not present

## 2018-02-10 DIAGNOSIS — M810 Age-related osteoporosis without current pathological fracture: Secondary | ICD-10-CM | POA: Diagnosis not present

## 2018-02-12 ENCOUNTER — Encounter: Payer: Self-pay | Admitting: Family Medicine

## 2018-02-12 ENCOUNTER — Ambulatory Visit (INDEPENDENT_AMBULATORY_CARE_PROVIDER_SITE_OTHER): Payer: Medicare HMO | Admitting: Family Medicine

## 2018-02-12 ENCOUNTER — Ambulatory Visit
Admission: RE | Admit: 2018-02-12 | Discharge: 2018-02-12 | Disposition: A | Discharge status: Home / Self Care | Payer: Medicare HMO | Source: Ambulatory Visit | Attending: Family Medicine | Admitting: Family Medicine

## 2018-02-12 VITALS — BP 120/70 | HR 72 | Temp 98.0°F | Wt 128.6 lb

## 2018-02-12 DIAGNOSIS — M79672 Pain in left foot: Secondary | ICD-10-CM | POA: Insufficient documentation

## 2018-02-12 DIAGNOSIS — T07XXXA Unspecified multiple injuries, initial encounter: Secondary | ICD-10-CM | POA: Diagnosis not present
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
## 2018-02-15 ENCOUNTER — Telehealth: Payer: Self-pay
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
## 2018-02-15 NOTE — Telephone Encounter (Signed)
Patient advised. She verbalized understanding.  

## 2018-04-13 ENCOUNTER — Other Ambulatory Visit: Payer: Self-pay | Admitting: Family Medicine

## 2018-05-17 ENCOUNTER — Other Ambulatory Visit: Payer: Self-pay | Admitting: Family Medicine

## 2018-07-05 DIAGNOSIS — R69 Illness, unspecified: Secondary | ICD-10-CM | POA: Diagnosis not present

## 2018-07-07 DIAGNOSIS — R69 Illness, unspecified: Secondary | ICD-10-CM | POA: Diagnosis not present

## 2018-07-14 ENCOUNTER — Ambulatory Visit (INDEPENDENT_AMBULATORY_CARE_PROVIDER_SITE_OTHER): Payer: Medicare HMO

## 2018-07-14 DIAGNOSIS — Z23 Encounter for immunization: Secondary | ICD-10-CM

## 2018-07-20 ENCOUNTER — Other Ambulatory Visit: Payer: Self-pay | Admitting: Family Medicine

## 2018-07-20 NOTE — Telephone Encounter (Signed)
See refill request.

## 2018-08-03 DIAGNOSIS — D225 Melanocytic nevi of trunk: Secondary | ICD-10-CM | POA: Diagnosis not present

## 2018-08-03 DIAGNOSIS — D2271 Melanocytic nevi of right lower limb, including hip: Secondary | ICD-10-CM | POA: Diagnosis not present

## 2018-08-03 DIAGNOSIS — D2262 Melanocytic nevi of left upper limb, including shoulder: Secondary | ICD-10-CM | POA: Diagnosis not present

## 2018-08-03 DIAGNOSIS — D2261 Melanocytic nevi of right upper limb, including shoulder: Secondary | ICD-10-CM | POA: Diagnosis not present

## 2018-08-03 DIAGNOSIS — D2272 Melanocytic nevi of left lower limb, including hip: Secondary | ICD-10-CM | POA: Diagnosis not present

## 2018-08-10 ENCOUNTER — Other Ambulatory Visit: Payer: Self-pay | Admitting: Family Medicine

## 2018-09-20 ENCOUNTER — Other Ambulatory Visit: Payer: Self-pay | Admitting: Family Medicine

## 2018-09-20 DIAGNOSIS — Z1231 Encounter for screening mammogram for malignant neoplasm of breast: Secondary | ICD-10-CM

## 2018-11-23 ENCOUNTER — Ambulatory Visit (INDEPENDENT_AMBULATORY_CARE_PROVIDER_SITE_OTHER): Payer: Medicare HMO | Admitting: Family Medicine

## 2018-11-23 ENCOUNTER — Encounter: Payer: Self-pay | Admitting: Family Medicine

## 2018-11-23 ENCOUNTER — Telehealth: Payer: Self-pay

## 2018-11-23 VITALS — BP 118/64 | HR 47 | Temp 97.5°F | Resp 16 | Wt 126.0 lb

## 2018-11-23 DIAGNOSIS — E559 Vitamin D deficiency, unspecified: Secondary | ICD-10-CM

## 2018-11-23 DIAGNOSIS — E7800 Pure hypercholesterolemia, unspecified: Secondary | ICD-10-CM

## 2018-11-23 DIAGNOSIS — I959 Hypotension, unspecified: Secondary | ICD-10-CM

## 2018-11-23 DIAGNOSIS — M810 Age-related osteoporosis without current pathological fracture: Secondary | ICD-10-CM | POA: Diagnosis not present

## 2018-11-23 NOTE — Progress Notes (Signed)
Patient: Kristina Jordan Female    DOB: [birth year]   68 y.o.   MRN: [id number] Visit Date: 11/23/2018  Today's Provider: Vernie Murders, PA   Chief Complaint  Patient presents with  . Dizziness   Subjective:     Dizziness  This is a new problem. The current episode started today. Associated symptoms include nausea and a visual change. Pertinent negatives include no abdominal pain, anorexia, arthralgias, change in bowel habit, chest pain, chills, congestion, coughing, diaphoresis, fatigue, fever, headaches, joint swelling, myalgias, neck pain, numbness, rash, sore throat, swollen glands, urinary symptoms, vertigo, vomiting or weakness. The symptoms are aggravated by bending. She has tried nothing for the symptoms.     Patient called stating that she woke up feeling dizzy this morning and had double vision for about 25 minutes. She reports that she is feeling better now. She just feels lightheaded when she gets up. Denies sob, chest pain, arm pain, headache.   Past Medical History:  Diagnosis Date  . Hyperlipidemia   . MITRAL INSUFFICIENCY   . MITRAL VALVE PROLAPSE    Past Surgical History:  Procedure Laterality Date  . ABDOMINAL HYSTERECTOMY    . BREAST BIOPSY Left    neg  . BREAST BIOPSY Left    stereo- neg  . CERVICAL SPINE SURGERY    . COLONOSCOPY WITH PROPOFOL N/A 12/02/2016   Procedure: COLONOSCOPY WITH PROPOFOL;  Surgeon: [name], MD;  Location: [facility] ENDOSCOPY;  Service: Endoscopy;  Laterality: N/A;  . LEFT HEART CATHETERIZATION WITH CORONARY ANGIOGRAM N/A 09/10/2011   Procedure: LEFT HEART CATHETERIZATION WITH CORONARY ANGIOGRAM;  Surgeon: Wellington Hampshire, MD;  Location: [facility];  Service: Cardiovascular;  Laterality: N/A;   Family History  Problem Relation Age of Onset  . Multiple sclerosis Sister   . Cancer Brother        bladder  . Heart attack Mother   . Cancer Sister        bladder  . Coronary artery disease Other   . Thyroid disease Other   .  Breast cancer Neg Hx    Allergies  Allergen Reactions  . Sulfonamide Derivatives Rash    Current Outpatient Medications:  .  alendronate (FOSAMAX) 70 MG tablet, TAKE 1 TABLET BY MOUTH ONCE A WEEK WITH  A  FULL  GLASS  OF  WATER  ON  AN  EMPTY  STOMACH.  DO  NOT  LIE  DOWN  UNTIL  AFTER  BREAKFAST, Disp: 12 tablet, Rfl: 3 .  aspirin 81 MG tablet, Take 81 mg by mouth daily., Disp: , Rfl:  .  Calcium Carb-Cholecalciferol (CALCIUM 600 + D PO), Take 2 capsules by mouth daily. , Disp: , Rfl:  .  Cholecalciferol (VITAMIN D-3) 1000 UNITS CAPS, Take 2,000 Units by mouth once., Disp: , Rfl:  .  diltiazem (CARDIZEM) 120 MG tablet, TAKE 1 TABLET BY MOUTH ONCE DAILY, Disp: 90 tablet, Rfl: 3 .  propranolol (INDERAL) 40 MG tablet, TAKE 1 TABLET BY MOUTH TWICE DAILY, Disp: 180 tablet, Rfl: 3 .  phenazopyridine (PYRIDIUM) 100 MG tablet, Take 1 tablet (100 mg total) by mouth 3 (three) times daily as needed for pain. (Patient not taking: Reported on 11/23/2018), Disp: 10 tablet, Rfl: 0  Review of Systems  Constitutional: Negative for appetite change, chills, diaphoresis, fatigue and fever.  HENT: Negative for congestion and sore throat.   Eyes: Positive for visual disturbance. Negative for photophobia and pain.  Respiratory: Negative for cough, chest  tightness and shortness of breath.   Cardiovascular: Negative for chest pain and palpitations.  Gastrointestinal: Positive for nausea. Negative for abdominal pain, anorexia, change in bowel habit and vomiting.  Musculoskeletal: Negative for arthralgias, joint swelling, myalgias and neck pain.  Skin: Negative for rash.  Neurological: Negative for dizziness, vertigo, weakness, numbness and headaches.   Social History   Tobacco Use  . Smoking status: Former Smoker    Types: Cigarettes  . Smokeless tobacco: Never Used  . Tobacco comment: only socially  Substance Use Topics  . Alcohol use: No     Objective:   BP 118/64 (BP Location: Right Arm, Cuff Size:  Normal)   Pulse (!) 47   Temp (!) 97.5 F (36.4 C) (Oral)   Resp 16   Wt 126 lb (57.2 kg)   SpO2 98%   BMI 21.63 kg/m  Vitals:   11/23/18 1321  BP: (!) 89/49  Pulse: (!) 47  Resp: 16  Temp: (!) 97.5 F (36.4 C)  TempSrc: Oral  SpO2: 98%  Weight: 126 lb (57.2 kg)   Physical Exam Constitutional:      Appearance: She is well-developed.  HENT:     Head: Normocephalic and atraumatic.     Right Ear: External ear normal.     Left Ear: External ear normal.     Nose: Nose normal.     Mouth/Throat:     Pharynx: Oropharynx is clear.  Eyes:     General:        Right eye: No discharge.     Conjunctiva/sclera: Conjunctivae normal.     Pupils: Pupils are equal, round, and reactive to light.  Neck:     Musculoskeletal: Normal range of motion and neck supple.     Thyroid: No thyromegaly.     Trachea: No tracheal deviation.  Cardiovascular:     Rate and Rhythm: Normal rate and regular rhythm.     Pulses: Normal pulses.     Heart sounds: Normal heart sounds. No murmur.  Pulmonary:     Effort: Pulmonary effort is normal. No respiratory distress.     Breath sounds: Normal breath sounds. No wheezing or rales.  Chest:     Chest wall: No tenderness.  Abdominal:     General: Bowel sounds are normal. There is no distension.     Palpations: Abdomen is soft. There is no mass.     Tenderness: There is no abdominal tenderness. There is no guarding or rebound.  Musculoskeletal: Normal range of motion.        General: No tenderness.  Lymphadenopathy:     Cervical: No cervical adenopathy.  Skin:    General: Skin is warm and dry.     Findings: No erythema or rash.  Neurological:     Mental Status: She is alert and oriented to person, place, and time.     Cranial Nerves: No cranial nerve deficit.     Motor: No abnormal muscle tone.     Coordination: Coordination normal.     Deep Tendon Reflexes: Reflexes are normal and symmetric. Reflexes normal.  Psychiatric:        Behavior: Behavior  normal.        Thought Content: Thought content normal.        Judgment: Judgment normal.       Assessment & Plan    1. Hypotension, unspecified hypotension type Had some lightheaded/dizzy sensation early this morning for 20-25 minutes. No vomiting, hematemesis, melena, hematochezia or hematuria. BP was low  at 89/49 with pulse 47 upon arrival at the office (without dizziness or near syncope). EKG showed heart rate 60 and NSR. BP back up to 118/64 without orthostatic changes when sitting up from supine position. No dyspnea, cough or congestion. No chest pains, palpitations or murmur today. Continues Inderal 40 mg BID and  Cardizem 120 mg qd as usual for history of mitral valve prolapse. Recheck labs and proceed with Medicare Wellness Screening o 11-29-18. - EKG 12-Lead - CBC with Differential/Platelet - Comprehensive metabolic panel  2. Hypercholesterolemia without hypertriglyceridemia On 11-26-17, total cholesterol 161, HDL 79 and LDL 66. Continues low fat diet and will recheck labs. - Comprehensive metabolic panel - Lipid panel - TSH  3. Osteoporosis, unspecified osteoporosis type, unspecified pathological fracture presence Last BMD done 01-04-18 indicating thinning that is improved with use of Alendronate once a week. Recheck CBC and CMP. Continue present Caltrate supplement. - CBC with Differential/Platelet - Comprehensive metabolic panel  4. Avitaminosis D Continues to take 2000 IU of vitamin D daily. Will check blood levels to assess progress. - CBC with Differential/Platelet - VITAMIN D 25 Hydroxy (Vit-D Deficiency, Fractures)     Vernie Murders, PA  [facility]

## 2018-11-23 NOTE — Telephone Encounter (Signed)
Patient calling that she woke up feeling dizzy this morning and double vision for about 25 minutes. She reports that she is feeling better now. She just feels lightheaded when she gets up.Denies sob,chest pain,arm pain, headache. Per Simona Huh patient can be schedule at 1 pm. Patient scheduled.

## 2018-11-24 ENCOUNTER — Other Ambulatory Visit: Payer: Self-pay | Admitting: Ophthalmology

## 2018-11-24 DIAGNOSIS — H532 Diplopia: Secondary | ICD-10-CM

## 2018-11-24 LAB — COMPREHENSIVE METABOLIC PANEL
ALK PHOS: 80 IU/L (ref 39–117)
ALT: 23 IU/L (ref 0–32)
AST: 20 IU/L (ref 0–40)
Albumin/Globulin Ratio: 1.8 (ref 1.2–2.2)
Albumin: 4.8 g/dL (ref 3.8–4.8)
BILIRUBIN TOTAL: 0.3 mg/dL (ref 0.0–1.2)
BUN/Creatinine Ratio: 20 (ref 12–28)
BUN: 17 mg/dL (ref 8–27)
CHLORIDE: 106 mmol/L (ref 96–106)
CO2: 25 mmol/L (ref 20–29)
CREATININE: 0.84 mg/dL (ref 0.57–1.00)
Calcium: 10.5 mg/dL — ABNORMAL HIGH (ref 8.7–10.3)
GFR calc Af Amer: 82 mL/min/{1.73_m2} (ref >59)
GFR calc non Af Amer: 71 mL/min/{1.73_m2} (ref >59)
GLUCOSE: 92 mg/dL (ref 65–99)
Globulin, Total: 2.6 g/dL (ref 1.5–4.5)
Potassium: 4.2 mmol/L (ref 3.5–5.2)
Sodium: 145 mmol/L — ABNORMAL HIGH (ref 134–144)
Total Protein: 7.4 g/dL (ref 6.0–8.5)

## 2018-11-24 LAB — CBC WITH DIFFERENTIAL/PLATELET
BASOS ABS: 0 10*3/uL (ref 0.0–0.2)
Basos: 0 %
EOS (ABSOLUTE): 0.3 10*3/uL (ref 0.0–0.4)
Eos: 4 %
HEMOGLOBIN: 12.6 g/dL (ref 11.1–15.9)
Hematocrit: 38.1 % (ref 34.0–46.6)
Immature Grans (Abs): 0 10*3/uL (ref 0.0–0.1)
Immature Granulocytes: 0 %
LYMPHS ABS: 1.6 10*3/uL (ref 0.7–3.1)
LYMPHS: 22 %
MCH: 28.4 pg (ref 26.6–33.0)
MCHC: 33.1 g/dL (ref 31.5–35.7)
MCV: 86 fL (ref 79–97)
MONOCYTES: 7 %
Monocytes Absolute: 0.5 10*3/uL (ref 0.1–0.9)
NEUTROS ABS: 4.9 10*3/uL (ref 1.4–7.0)
Neutrophils: 67 %
PLATELETS: 242 10*3/uL (ref 150–450)
RBC: 4.44 x10E6/uL (ref 3.77–5.28)
RDW: 13 % (ref 11.7–15.4)
WBC: 7.4 10*3/uL (ref 3.4–10.8)

## 2018-11-24 LAB — LIPID PANEL
CHOLESTEROL TOTAL: 172 mg/dL (ref 100–199)
Chol/HDL Ratio: 2.2 ratio (ref 0.0–4.4)
HDL: 79 mg/dL (ref >39)
LDL CALC: 77 mg/dL (ref 0–99)
TRIGLYCERIDES: 81 mg/dL (ref 0–149)
VLDL CHOLESTEROL CAL: 16 mg/dL (ref 5–40)

## 2018-11-24 LAB — TSH: TSH: 1.2 u[IU]/mL (ref 0.450–4.500)

## 2018-11-24 LAB — VITAMIN D 25 HYDROXY (VIT D DEFICIENCY, FRACTURES): Vit D, 25-Hydroxy: 40.7 ng/mL (ref 30.0–100.0)

## 2018-11-25 ENCOUNTER — Telehealth: Payer: Self-pay | Admitting: Family Medicine

## 2018-11-25 NOTE — Telephone Encounter (Signed)
Patient was notified of results. Expressed understanding.  

## 2018-11-25 NOTE — Telephone Encounter (Signed)
Pt called about her lab results  Thanks teri

## 2018-11-25 NOTE — Telephone Encounter (Signed)
All blood tests essentially normal. No sign of anemia, low blood sugar or significant dehydration. Be sure to drink 6 eight ounce glasses of water daily and get more protein in diet (eat 3 regular meals daily). Recheck as needed if any further dizziness (may need referral to a neurologist).

## 2018-11-26 ENCOUNTER — Other Ambulatory Visit
Admission: RE | Admit: 2018-11-26 | Discharge: 2018-11-26 | Disposition: A | Discharge status: Home / Self Care | Payer: Medicare HMO | Attending: Ophthalmology | Admitting: Ophthalmology

## 2018-11-26 DIAGNOSIS — H532 Diplopia: Secondary | ICD-10-CM | POA: Insufficient documentation

## 2018-11-26 LAB — C-REACTIVE PROTEIN

## 2018-11-26 LAB — SEDIMENTATION RATE: Sed Rate: 11 mm/hr (ref 0–30)

## 2018-11-29 ENCOUNTER — Encounter: Payer: Self-pay | Admitting: Family Medicine

## 2018-11-29 ENCOUNTER — Ambulatory Visit (INDEPENDENT_AMBULATORY_CARE_PROVIDER_SITE_OTHER): Payer: Medicare HMO | Admitting: Family Medicine

## 2018-11-29 ENCOUNTER — Other Ambulatory Visit: Payer: Self-pay

## 2018-11-29 ENCOUNTER — Ambulatory Visit (INDEPENDENT_AMBULATORY_CARE_PROVIDER_SITE_OTHER): Payer: Medicare HMO

## 2018-11-29 VITALS — BP 108/54 | HR 70 | Temp 98.6°F | Ht 64.0 in | Wt 126.8 lb

## 2018-11-29 DIAGNOSIS — I341 Nonrheumatic mitral (valve) prolapse: Secondary | ICD-10-CM | POA: Diagnosis not present

## 2018-11-29 DIAGNOSIS — E559 Vitamin D deficiency, unspecified: Secondary | ICD-10-CM

## 2018-11-29 DIAGNOSIS — M810 Age-related osteoporosis without current pathological fracture: Secondary | ICD-10-CM

## 2018-11-29 DIAGNOSIS — Z0000 Encounter for general adult medical examination without abnormal findings: Secondary | ICD-10-CM | POA: Diagnosis not present

## 2018-11-29 DIAGNOSIS — E7800 Pure hypercholesterolemia, unspecified: Secondary | ICD-10-CM

## 2018-11-29 NOTE — Progress Notes (Signed)
Patient: Kristina Jordan, Female    DOB: November 26, 1950, 68 y.o.   MRN: [id number] Visit Date: 11/29/2018  Today's Provider: Vernie Murders, PA   Chief Complaint  Patient presents with  . Annual Exam   Subjective:   Patient saw McKenzie for AWV today at 9:20 am.   Complete Physical Kristina Jordan is a 68 y.o. female. She feels fairly well. She reports exercising yes/some. She reports she is sleeping fairly well.  -----------------------------------------------------------   Review of Systems  Constitutional: Positive for fatigue.  HENT: Positive for drooling, rhinorrhea and sneezing.   Eyes: Positive for itching and visual disturbance.  Respiratory: Positive for cough.   Cardiovascular: Positive for palpitations.  Gastrointestinal: Positive for anal bleeding and constipation.  Endocrine: Positive for polyuria.  Neurological: Positive for dizziness.  Psychiatric/Behavioral: Positive for decreased concentration.  All other systems reviewed and are negative.   Social History   Socioeconomic History  . Marital status: Married    Spouse name: Not on file  . Number of children: 2  . Years of education: Not on file  . Highest education level: 12th grade  Occupational History  . Occupation: retired  Scientific laboratory technician  . Financial resource strain: Not hard at all  . Food insecurity:    Worry: Never true    Inability: Never true  . Transportation needs:    Medical: No    Non-medical: No  Tobacco Use  . Smoking status: Former Smoker    Types: Cigarettes  . Smokeless tobacco: Never Used  . Tobacco comment: only socially  Substance and Sexual Activity  . Alcohol use: No  . Drug use: No  . Sexual activity: Yes  Lifestyle  . Physical activity:    Days per week: 0 days    Minutes per session: 0 min  . Stress: Only a little  Relationships  . Social connections:    Talks on phone: Patient refused    Gets together: Patient refused    Attends religious service: Patient  refused    Active member of club or organization: Patient refused    Attends meetings of clubs or organizations: Patient refused    Relationship status: Patient refused  . Intimate partner violence:    Fear of current or ex partner: Patient refused    Emotionally abused: Patient refused    Physically abused: Patient refused    Forced sexual activity: Patient refused  Other Topics Concern  . Not on file  Social History Narrative  . Not on file   Past Medical History:  Diagnosis Date  . Hyperlipidemia   . MITRAL INSUFFICIENCY   . MITRAL VALVE PROLAPSE     Patient Active Problem List   Diagnosis Date Noted  . Ganglion cyst of flexor tendon sheath of finger 12/30/2016  . Heme + stool   . Hypercholesterolemia without hypertriglyceridemia 11/19/2015  . Avitaminosis D 11/19/2015  . Atypical chest pain 09/09/2011  . Non-ST elevation MI (NSTEMI) ([place]) 09/09/2011  . 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 MITRAL INSUFFICIENCY 12/26/2009  . Mitral valve disorder 12/24/2009  . FIBRILLATION, ATRIAL 12/24/2009  . Heart disease 11/01/2009  . 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 Acquired trigger finger 01/02/2009  . Need for prophylactic hormone replacement therapy (postmenopausal) 10/09/2006  . OP (osteoporosis) 10/09/2006   Past Surgical History:  Procedure Laterality Date  . ABDOMINAL HYSTERECTOMY    . BREAST BIOPSY Left    neg  . BREAST BIOPSY Left    stereo- neg  .  CERVICAL SPINE SURGERY    . COLONOSCOPY WITH PROPOFOL N/A 12/02/2016   Procedure: COLONOSCOPY WITH PROPOFOL;  Surgeon: [name], MD;  Location: [facility] ENDOSCOPY;  Service: Endoscopy;  Laterality: N/A;  . LEFT HEART CATHETERIZATION WITH CORONARY ANGIOGRAM N/A 09/10/2011   Procedure: LEFT HEART CATHETERIZATION WITH CORONARY ANGIOGRAM;  Surgeon: Wellington Hampshire, MD;  Location: [facility];  Service: Cardiovascular;  Laterality: N/A;   Her family history includes Cancer in her brother and sister; Coronary artery disease in an other  family member; Heart attack in her mother; Multiple sclerosis in her sister; Thyroid disease in an other family member. There is no history of Breast cancer.   Current Outpatient Medications:  .  alendronate (FOSAMAX) 70 MG tablet, TAKE 1 TABLET BY MOUTH ONCE A WEEK WITH  A  FULL  GLASS  OF  WATER  ON  AN  EMPTY  STOMACH.  DO  NOT  LIE  DOWN  UNTIL  AFTER  BREAKFAST, Disp: 12 tablet, Rfl: 3 .  aspirin 81 MG tablet, Take 81 mg by mouth daily., Disp: , Rfl:  .  Calcium Carb-Cholecalciferol (CALCIUM 600 + D PO), Take 2 capsules by mouth daily. , Disp: , Rfl:  .  CALCIUM CITRATE PO, Take 600 mg by mouth 2 (two) times daily., Disp: , Rfl:  .  Cholecalciferol (VITAMIN D-3) 1000 UNITS CAPS, Take 2,000 Units by mouth once., Disp: , Rfl:  .  diltiazem (CARDIZEM) 120 MG tablet, TAKE 1 TABLET BY MOUTH ONCE DAILY, Disp: 90 tablet, Rfl: 3 .  propranolol (INDERAL) 40 MG tablet, TAKE 1 TABLET BY MOUTH TWICE DAILY, Disp: 180 tablet, Rfl: 3  Patient Care Team: Olander Friedl, Vickki Muff, PA as PCP - General (Family Medicine) Minus Breeding, MD as Consulting Physician (Cardiology) Dingeldein, Remo Lipps, MD as Consulting Physician (Ophthalmology)     Objective:     Vitals: BP 108/54 (BP Location: Right Arm)   Pulse 70   Temp 98.6 F (37 C) (Oral)   Ht 5\' 4"  (1.626 m)   Wt 126 lb 12.8 oz (57.5 kg)   BMI 21.77 kg/m  BSA 1.61 m   Physical Exam Constitutional:      Appearance: She is well-developed.  HENT:     Head: Normocephalic and atraumatic.     Right Ear: External ear normal.     Left Ear: External ear normal.     Nose: Nose normal.  Eyes:     General:        Right eye: No discharge.     Conjunctiva/sclera: Conjunctivae normal.     Pupils: Pupils are equal, round, and reactive to light.  Neck:     Musculoskeletal: Normal range of motion and neck supple.     Thyroid: No thyromegaly.     Trachea: No tracheal deviation.  Cardiovascular:     Rate and Rhythm: Normal rate and regular rhythm.      Heart sounds: Normal heart sounds. No murmur.  Pulmonary:     Effort: Pulmonary effort is normal. No respiratory distress.     Breath sounds: Normal breath sounds. No wheezing or rales.  Chest:     Chest wall: No tenderness.  Abdominal:     General: There is no distension.     Palpations: Abdomen is soft. There is no mass.     Tenderness: There is no abdominal tenderness. There is no guarding or rebound.  Musculoskeletal: Normal range of motion.        General: No tenderness.  Lymphadenopathy:  Cervical: No cervical adenopathy.  Skin:    General: Skin is warm and dry.     Findings: No erythema or rash.  Neurological:     Mental Status: She is alert and oriented to person, place, and time.     Cranial Nerves: No cranial nerve deficit.     Motor: No abnormal muscle tone.     Coordination: Coordination normal.     Deep Tendon Reflexes: Reflexes are normal and symmetric. Reflexes normal.  Psychiatric:        Behavior: Behavior normal.        Thought Content: Thought content normal.        Judgment: Judgment normal.     Activities of Daily Living In your present state of health, do you have any difficulty performing the following activities: 11/29/2018  Hearing? Y  Comment Does not wear hearing aids.   Vision? N  Comment Wears eye glasses daily.   Difficulty concentrating or making decisions? N  Walking or climbing stairs? N  Dressing or bathing? N  Doing errands, shopping? N  Preparing Food and eating ? N  Using the Toilet? N  In the past six months, have you accidently leaked urine? Y  Comment Occasionally with urges.   Do you have problems with loss of bowel control? Y  Comment Occasionally with urges.   Managing your Medications? N  Managing your Finances? N  Housekeeping or managing your Housekeeping? N  Some recent data might be hidden    Fall Risk Assessment Fall Risk  11/29/2018 11/26/2017 11/07/2016 11/19/2015  Falls in the past year? 0 No No No      Depression Screen PHQ 2/9 Scores 11/29/2018 11/26/2017 11/07/2016 11/19/2015  PHQ - 2 Score 2 1 0 0  PHQ- 9 Score 2 4 - -    6CIT Screen 11/07/2016  What Year? 0 points  What month? 0 points  What time? 0 points  Count back from 20 0 points  Months in reverse 0 points  Repeat phrase 4 points  Total Score 4      Assessment & Plan:    Annual Physical Reviewed patient's Family Medical History Reviewed and updated list of patient's medical providers Assessment of cognitive impairment was done Assessed patient's functional ability Established a written schedule for health screening [place] Completed and Reviewed  Exercise Activities and Dietary recommendations Goals    . DIET - INCREASE WATER INTAKE     Recommend increasing water intake to 4-6 glasses a day.     . Increase water intake     Starting 11/07/16, I will increase my water intake to 3-4 glasses a day.       Immunization History  Administered Date(s) Administered  . H1N1 10/12/2008  . Influenza, High Dose Seasonal PF 07/31/2014, 08/02/2015, 07/18/2016, 08/04/2017, 07/14/2018  . Pneumococcal Conjugate-13 11/17/2014  . Pneumococcal Polysaccharide-23 06/02/2011, 11/07/2016  . Tdap 10/21/2010  . Zoster 10/27/2011    Health Maintenance  Topic Date Due  . MAMMOGRAM  08/15/2019  . TETANUS/TDAP  10/21/2020  . DEXA SCAN  01/05/2023  . COLONOSCOPY  12/03/2026  . INFLUENZA VACCINE  Completed  . Hepatitis C Screening  Completed  . PNA vac Low Risk Adult  Completed     Discussed health benefits of physical activity, and encouraged her to engage in regular exercise appropriate for her age and condition.    ------------------------------------------------------------------------ 1. Osteoporosis, unspecified osteoporosis type, unspecified pathological fracture presence Still taking Calcium, Vitamin-D and Alendronate. Last BMD  completed on 01-04-18 with improvement in to osteopenic levels instead of  osteoporosis. General health good. With no further dizziness episodes recently. Immunizations, colonoscopy and mammograms up to date. Will continue present medications and recheck annually. CMP Latest Ref Rng & Units 11/23/2018 11/26/2017 11/20/2016  Glucose 65 - 99 mg/dL 92 89 91  BUN 8 - 27 mg/dL 17 13 16   Creatinine 0.57 - 1.00 mg/dL 0.84 0.76 0.84  Sodium 134 - 144 mmol/L 145(H) 142 142  Potassium 3.5 - 5.2 mmol/L 4.2 4.0 4.2  Chloride 96 - 106 mmol/L 106 105 102  CO2 20 - 29 mmol/L 25 24 24   Calcium 8.7 - 10.3 mg/dL 10.5(H) 9.5 9.5  Total Protein 6.0 - 8.5 g/dL 7.4 7.4 7.3  Total Bilirubin 0.0 - 1.2 mg/dL 0.3 0.3 0.3  Alkaline Phos 39 - 117 IU/L 80 74 84  AST 0 - 40 IU/L 20 19 26   ALT 0 - 32 IU/L 23 14 20    2. Avitaminosis D Still taking 2000 IU of Vitamin-D daily. Last blood check of levels was normal at 40.7 on 11-23-18. Continue present dosage and walk 30-40 minutes 3-4 days a week for exercise. Recheck annually.  3. Hypercholesterolemia without hypertriglyceridemia Controlled with diet and exercise. Lipid Panel     Component Value Date/Time   CHOL 172 11/23/2018 1434   TRIG 81 11/23/2018 1434   HDL 79 11/23/2018 1434   CHOLHDL 2.2 11/23/2018 1434   CHOLHDL 2.5 09/09/2011 1646   VLDL 29 09/09/2011 1646   LDLCALC 77 11/23/2018 1434   4. Mitral valve prolapse Tolerating Propranolol 40 mg BID and Diltiazem 120 mg qd without recent palpitations, dyspnea or edema. Followed by Dr. Percival Spanish (cardiologist). Recheck prn. CBC Latest Ref Rng & Units 11/23/2018 11/26/2017 11/20/2016  WBC 3.4 - 10.8 x10E3/uL 7.4 5.5 6.3  Hemoglobin 11.1 - 15.9 g/dL 12.6 12.7 12.5  Hematocrit 34.0 - 46.6 % 38.1 39.2 37.6  Platelets 150 - 450 x10E3/uL 242 254 [place], [place]  [facility]

## 2018-11-29 NOTE — Patient Instructions (Addendum)
Kristina Jordan , Thank you for taking time to come for your Medicare Wellness Visit. I appreciate your ongoing commitment to your health goals. Please review the following plan we discussed and let me know if I can assist you in the future.   Screening recommendations/referrals: Colonoscopy: Up to date, due 11/2026 Mammogram: Up to date, due 07/2019 Bone Density: Up to date, due 12/2022 Recommended yearly ophthalmology/optometry visit for glaucoma screening and checkup Recommended yearly dental visit for hygiene and checkup  Vaccinations: Influenza vaccine: Up to date Pneumococcal vaccine: Completed series Tdap vaccine: Up to date, due 10/2020 Shingles vaccine: Pt declines today.     Advanced directives: Please bring a copy of your POA (Power of Attorney) and/or Living Will to your next appointment.   Conditions/risks identified: Continue trying to increase water intake to 6-8 8 oz glasses a day.   Next appointment: 10:00 AM today with Vernie Murders.   Preventive Care 70 Years and Older, Female Preventive care refers to lifestyle choices and visits with your health care provider that can promote health and wellness. What does preventive care include?  A yearly physical exam. This is also called an annual well check.  Dental exams once or twice a year.  Routine eye exams. Ask your health care provider how often you should have your eyes checked.  Personal lifestyle choices, including:  Daily care of your teeth and gums.  Regular physical activity.  Eating a healthy diet.  Avoiding tobacco and drug use.  Limiting alcohol use.  Practicing safe sex.  Taking low-dose aspirin every day.  Taking vitamin and mineral supplements as recommended by your health care provider. What happens during an annual well check? The services and screenings done by your health care provider during your annual well check will depend on your age, overall health, lifestyle risk factors, and  family history of disease. Counseling  Your health care provider may ask you questions about your:  Alcohol use.  Tobacco use.  Drug use.  Emotional well-being.  Home and relationship well-being.  Sexual activity.  Eating habits.  History of falls.  Memory and ability to understand (cognition).  Work and work Statistician.  Reproductive health. Screening  You may have the following tests or measurements:  Height, weight, and BMI.  Blood pressure.  Lipid and cholesterol levels. These may be checked every 5 years, or more frequently if you are over 14 years old.  Skin check.  Lung cancer screening. You may have this screening every year starting at age 82 if you have a 30-pack-year history of smoking and currently smoke or have quit within the past 15 years.  Fecal occult blood test (FOBT) of the stool. You may have this test every year starting at age 1.  Flexible sigmoidoscopy or colonoscopy. You may have a sigmoidoscopy every 5 years or a colonoscopy every 10 years starting at age 29.  Hepatitis C blood test.  Hepatitis B blood test.  Sexually transmitted disease (STD) testing.  Diabetes screening. This is done by checking your blood sugar (glucose) after you have not eaten for a while (fasting). You may have this done every 1-3 years.  Bone density scan. This is done to screen for osteoporosis. You may have this done starting at age 7.  Mammogram. This may be done every 1-2 years. Talk to your health care provider about how often you should have regular mammograms. Talk with your health care provider about your test results, treatment options, and if necessary, the need for  more tests. Vaccines  Your health care provider may recommend certain vaccines, such as:  Influenza vaccine. This is recommended every year.  Tetanus, diphtheria, and acellular pertussis (Tdap, Td) vaccine. You may need a Td booster every 10 years.  Zoster vaccine. You may need this  after age 71.  Pneumococcal 13-valent conjugate (PCV13) vaccine. One dose is recommended after age 103.  Pneumococcal polysaccharide (PPSV23) vaccine. One dose is recommended after age 22. Talk to your health care provider about which screenings and vaccines you need and how often you need them. This information is not intended to replace advice given to you by your health care provider. Make sure you discuss any questions you have with your health care provider. Document Released: 09/28/2015 Document Revised: 05/21/2016 Document Reviewed: 07/03/2015 Elsevier Interactive Patient Education  2017 [place] Prevention in the Home Falls can cause injuries. They can happen to people of all ages. There are many things you can do to make your home safe and to help prevent falls. What can I do on the outside of my home?  Regularly fix the edges of walkways and driveways and fix any cracks.  Remove anything that might make you trip as you walk through a door, such as a raised step or threshold.  Trim any bushes or trees on the path to your home.  Use bright outdoor lighting.  Clear any walking paths of anything that might make someone trip, such as rocks or tools.  Regularly check to see if handrails are loose or broken. Make sure that both sides of any steps have handrails.  Any raised decks and porches should have guardrails on the edges.  Have any leaves, snow, or ice cleared regularly.  Use sand or salt on walking paths during winter.  Clean up any spills in your garage right away. This includes oil or grease spills. What can I do in the bathroom?  Use night lights.  Install grab bars by the toilet and in the tub and shower. Do not use towel bars as grab bars.  Use non-skid mats or decals in the tub or shower.  If you need to sit down in the shower, use a plastic, non-slip stool.  Keep the floor dry. Clean up any water that spills on the floor as soon as it happens.   Remove soap buildup in the tub or shower regularly.  Attach bath mats securely with double-sided non-slip rug tape.  Do not have throw rugs and other things on the floor that can make you trip. What can I do in the bedroom?  Use night lights.  Make sure that you have a light by your bed that is easy to reach.  Do not use any sheets or blankets that are too big for your bed. They should not hang down onto the floor.  Have a firm chair that has side arms. You can use this for support while you get dressed.  Do not have throw rugs and other things on the floor that can make you trip. What can I do in the kitchen?  Clean up any spills right away.  Avoid walking on wet floors.  Keep items that you use a lot in easy-to-reach places.  If you need to reach something above you, use a strong step stool that has a grab bar.  Keep electrical cords out of the way.  Do not use floor polish or wax that makes floors slippery. If you must use wax, use non-skid  floor wax.  Do not have throw rugs and other things on the floor that can make you trip. What can I do with my stairs?  Do not leave any items on the stairs.  Make sure that there are handrails on both sides of the stairs and use them. Fix handrails that are broken or loose. Make sure that handrails are as long as the stairways.  Check any carpeting to make sure that it is firmly attached to the stairs. Fix any carpet that is loose or worn.  Avoid having throw rugs at the top or bottom of the stairs. If you do have throw rugs, attach them to the floor with carpet tape.  Make sure that you have a light switch at the top of the stairs and the bottom of the stairs. If you do not have them, ask someone to add them for you. What else can I do to help prevent falls?  Wear shoes that:  Do not have high heels.  Have rubber bottoms.  Are comfortable and fit you well.  Are closed at the toe. Do not wear sandals.  If you use a  stepladder:  Make sure that it is fully opened. Do not climb a closed stepladder.  Make sure that both sides of the stepladder are locked into place.  Ask someone to hold it for you, if possible.  Clearly mark and make sure that you can see:  Any grab bars or handrails.  First and last steps.  Where the edge of each step is.  Use tools that help you move around (mobility aids) if they are needed. These include:  Canes.  Walkers.  Scooters.  Crutches.  Turn on the lights when you go into a dark area. Replace any light bulbs as soon as they burn out.  Set up your furniture so you have a clear path. Avoid moving your furniture around.  If any of your floors are uneven, fix them.  If there are any pets around you, be aware of where they are.  Review your medicines with your doctor. Some medicines can make you feel dizzy. This can increase your chance of falling. Ask your doctor what other things that you can do to help prevent falls. This information is not intended to replace advice given to you by your health care provider. Make sure you discuss any questions you have with your health care provider. Document Released: 06/28/2009 Document Revised: 02/07/2016 Document Reviewed: 10/06/2014 Elsevier Interactive Patient Education  2017 [employer].

## 2018-11-29 NOTE — Progress Notes (Addendum)
Subjective:   Kristina Jordan is a 68 y.o. female who presents for Medicare Annual (Subsequent) preventive examination.  Review of Systems:  N/A  Cardiac Risk Factors include: advanced age (>67men, >56 women)     Objective:     Vitals: BP (!) 108/54 (BP Location: Right Arm)   Pulse 70   Temp 98.6 F (37 C) (Oral)   Ht 5\' 4"  (1.626 m)   Wt 126 lb 12.8 oz (57.5 kg)   BMI 21.77 kg/m   Body mass index is 21.77 kg/m.  Advanced Directives 11/29/2018 11/26/2017 12/02/2016 11/07/2016 09/10/2011 09/09/2011  Does Patient Have a Medical Advance Directive? Yes Yes Yes Yes Patient does not have advance directive Patient would like information  Type of Advance Directive [place];Living will Living will Living will Out of facility DNR (pink MOST or yellow form) - -  Copy of [place] in Chart? No - copy requested - - - - -  Would patient like information on creating a medical advance directive? - - - Yes (ED - Information included in AVS) Advance directive packet given Referral made to social work  Pre-existing out of facility DNR order (yellow form or pink MOST form) - - - - No No    Tobacco Social History   Tobacco Use  Smoking Status Former Smoker  . Types: Cigarettes  Smokeless Tobacco Never Used  Tobacco Comment   only socially     Counseling given: Not Answered Comment: only socially   Clinical Intake:  Pre-visit preparation completed: Yes  Pain : No/denies pain Pain Score: 0-No pain     Nutritional Status: BMI of 19-24  Normal Nutritional Risks: None Diabetes: No  How often do you need to have someone help you when you read instructions, pamphlets, or other written materials from your doctor or pharmacy?: 1 - Never  Interpreter Needed?: No  Information entered by :: [facility], LPN  Past Medical History:  Diagnosis Date  . Hyperlipidemia   . MITRAL INSUFFICIENCY   . MITRAL VALVE PROLAPSE    Past Surgical History:   Procedure Laterality Date  . ABDOMINAL HYSTERECTOMY    . BREAST BIOPSY Left    neg  . BREAST BIOPSY Left    stereo- neg  . CERVICAL SPINE SURGERY    . COLONOSCOPY WITH PROPOFOL N/A 12/02/2016   Procedure: COLONOSCOPY WITH PROPOFOL;  Surgeon: [name], MD;  Location: [facility] ENDOSCOPY;  Service: Endoscopy;  Laterality: N/A;  . LEFT HEART CATHETERIZATION WITH CORONARY ANGIOGRAM N/A 09/10/2011   Procedure: LEFT HEART CATHETERIZATION WITH CORONARY ANGIOGRAM;  Surgeon: Wellington Hampshire, MD;  Location: [facility] CATH [facility];  Service: Cardiovascular;  Laterality: N/A;   Family History  Problem Relation Age of Onset  . Multiple sclerosis Sister   . Cancer Brother        bladder  . Heart attack Mother   . Cancer Sister        bladder  . Coronary artery disease Other   . Thyroid disease Other   . Breast cancer Neg Hx    Social History   Socioeconomic History  . Marital status: Married    Spouse name: Not on file  . Number of children: 2  . Years of education: Not on file  . Highest education level: 12th grade  Occupational History  . Occupation: retired  Scientific laboratory technician  . Financial resource strain: Not hard at all  . Food insecurity:    Worry: Never true  Inability: Never true  . Transportation needs:    Medical: No    Non-medical: No  Tobacco Use  . Smoking status: Former Smoker    Types: Cigarettes  . Smokeless tobacco: Never Used  . Tobacco comment: only socially  Substance and Sexual Activity  . Alcohol use: No  . Drug use: No  . Sexual activity: Yes  Lifestyle  . Physical activity:    Days per week: 0 days    Minutes per session: 0 min  . Stress: Only a little  Relationships  . Social connections:    Talks on phone: Patient refused    Gets together: Patient refused    Attends religious service: Patient refused    Active member of club or organization: Patient refused    Attends meetings of clubs or organizations: Patient refused    Relationship status: Patient refused   Other Topics Concern  . Not on file  Social History Narrative  . Not on file    Outpatient Encounter Medications as of 11/29/2018  Medication Sig  . alendronate (FOSAMAX) 70 MG tablet TAKE 1 TABLET BY MOUTH ONCE A WEEK WITH  A  FULL  GLASS  OF  WATER  ON  AN  EMPTY  STOMACH.  DO  NOT  LIE  DOWN  UNTIL  AFTER  BREAKFAST  . aspirin 81 MG tablet Take 81 mg by mouth daily.  Marland Kitchen CALCIUM CITRATE PO Take 600 mg by mouth 2 (two) times daily.  . Cholecalciferol (VITAMIN D-3) 1000 UNITS CAPS Take 2,000 Units by mouth once.  . diltiazem (CARDIZEM) 120 MG tablet TAKE 1 TABLET BY MOUTH ONCE DAILY  . propranolol (INDERAL) 40 MG tablet TAKE 1 TABLET BY MOUTH TWICE DAILY  . Calcium Carb-Cholecalciferol (CALCIUM 600 + D PO) Take 2 capsules by mouth daily.    No facility-administered encounter medications on file as of 11/29/2018.     Activities of Daily Living In your present state of health, do you have any difficulty performing the following activities: 11/29/2018  Hearing? Y  Comment Does not wear hearing aids.   Vision? N  Comment Wears eye glasses daily.   Difficulty concentrating or making decisions? N  Walking or climbing stairs? N  Dressing or bathing? N  Doing errands, shopping? N  Preparing Food and eating ? N  Using the Toilet? N  In the past six months, have you accidently leaked urine? Y  Comment Occasionally with urges.   Do you have problems with loss of bowel control? Y  Comment Occasionally with urges.   Managing your Medications? N  Managing your Finances? N  Housekeeping or managing your Housekeeping? N  Some recent data might be hidden    Patient Care Team: Chrismon, Vickki Muff, PA as PCP - General (Family Medicine) Minus Breeding, MD as Consulting Physician (Cardiology) Dingeldein, Remo Lipps, MD as Consulting Physician (Ophthalmology)    Assessment:   This is a routine wellness examination for [place].  Exercise Activities and Dietary recommendations Current Exercise  Habits: Home exercise routine, Type of exercise: Other - see comments(rides a stationary bike), Time (Minutes): 30, Frequency (Times/Week): 3, Weekly Exercise (Minutes/Week): 90, Intensity: Mild, Exercise limited by: None identified  Goals    . DIET - INCREASE WATER INTAKE     Recommend increasing water intake to 4-6 glasses a day.     . Increase water intake     Starting 11/07/16, I will increase my water intake to 3-4 glasses a day.  Fall Risk: Fall Risk  11/29/2018 11/26/2017 11/07/2016 11/19/2015  Falls in the past year? 0 No No No    FALL RISK PREVENTION PERTAINING TO THE HOME:  Any stairs in or around the home? Yes  If so, are there any without handrails? Yes   Home free of loose throw rugs in walkways, pet beds, electrical cords, etc? Yes  Adequate lighting in your home to reduce risk of falls? Yes   ASSISTIVE DEVICES UTILIZED TO PREVENT FALLS:  Life alert? No  Use of a cane, walker or w/c? No  Grab bars in the bathroom? No  Shower chair or bench in shower? No  Elevated toilet seat or a handicapped toilet? No   DME ORDERS:  DME order needed?  No   TIMED UP AND GO:  Was the test performed? No .    Depression Screen PHQ 2/9 Scores 11/29/2018 11/26/2017 11/07/2016 11/19/2015  PHQ - 2 Score 2 1 0 0  PHQ- 9 Score 2 4 - -     Cognitive Function: Declined today.      6CIT Screen 11/07/2016  What Year? 0 points  What month? 0 points  What time? 0 points  Count back from 20 0 points  Months in reverse 0 points  Repeat phrase 4 points  Total Score 4    Immunization History  Administered Date(s) Administered  . H1N1 10/12/2008  . Influenza, High Dose Seasonal PF 07/31/2014, 08/02/2015, 07/18/2016, 08/04/2017, 07/14/2018  . Pneumococcal Conjugate-13 11/17/2014  . Pneumococcal Polysaccharide-23 06/02/2011, 11/07/2016  . Tdap 10/21/2010  . Zoster 10/27/2011    Qualifies for Shingles Vaccine? Yes  Zostavax completed 10/27/11. Due for Shingrix. Education has  been provided regarding the importance of this vaccine. Pt has been advised to call insurance company to determine out of pocket expense. Advised may also receive vaccine at local pharmacy or Health Dept. Verbalized acceptance and understanding.  Tdap: Up to date  Flu Vaccine: Up to date  Pneumococcal Vaccine: Up to date  Screening Tests Health Maintenance  Topic Date Due  . MAMMOGRAM  08/15/2019  . TETANUS/TDAP  10/21/2020  . DEXA SCAN  01/05/2023  . COLONOSCOPY  12/03/2026  . INFLUENZA VACCINE  Completed  . Hepatitis C Screening  Completed  . PNA vac Low Risk Adult  Completed    Cancer Screenings:  Colorectal Screening: Completed 12/02/16. Repeat every 10 years.  Mammogram: Completed 08/14/17.   Bone Density: Completed 01/04/18. Results reflect OSTEOPENIA. Repeat every 5 years.   Lung Cancer Screening: (Low Dose CT Chest recommended if Age 59-80 years, 30 pack-year currently smoking OR have quit w/in 15years.) does not qualify.   Additional Screening:  Hepatitis C Screening: Up to date  Vision Screening: Recommended annual ophthalmology exams for early detection of glaucoma and other disorders of the eye.  Dental Screening: Recommended annual dental exams for proper oral hygiene  Community Resource Referral:  CRR required this visit?  No       Plan:  I have personally reviewed and addressed the Medicare Annual Wellness questionnaire and have noted the following in the patient's chart:  A. Medical and social history B. Use of alcohol, tobacco or illicit drugs  C. Current medications and supplements D. Functional ability and status E.  Nutritional status F.  Physical activity G. Advance directives H. List of other physicians I.  Hospitalizations, surgeries, and ER visits in previous 12 months J.  [place] such as hearing and vision if needed, cognitive and depression L. Referrals and appointments  In addition, I have reviewed and discussed with  patient certain preventive protocols, quality metrics, and best practice recommendations. A written personalized care plan for preventive services as well as general preventive health recommendations were provided to patient. Nurse Health Advisor  Signed,    Sevannah Madia Burgin, [place]  [birth year] Nurse Health Advisor   Nurse Notes: None.  Reviewed note and plan of Nurse Health Advisor. Was available for consultation during screening. Agree with documentation and recommendations.

## 2018-11-30 ENCOUNTER — Ambulatory Visit
Admission: RE | Admit: 2018-11-30 | Discharge: 2018-11-30 | Disposition: A | Discharge status: Home / Self Care | Payer: Medicare HMO | Source: Ambulatory Visit | Attending: Ophthalmology | Admitting: Ophthalmology

## 2018-11-30 ENCOUNTER — Other Ambulatory Visit: Payer: Self-pay

## 2018-11-30 DIAGNOSIS — H532 Diplopia: Secondary | ICD-10-CM

## 2018-11-30 MED ORDER — GADOBUTROL 1 MMOL/ML IV SOLN
5.0000 mL | Freq: Once | INTRAVENOUS | Status: AC | PRN
  Administered 2018-11-30: 5 mL via INTRAVENOUS

## 2019-03-16 ENCOUNTER — Other Ambulatory Visit: Payer: Self-pay | Admitting: Family Medicine

## 2019-03-16 DIAGNOSIS — Z1231 Encounter for screening mammogram for malignant neoplasm of breast: Secondary | ICD-10-CM

## 2019-03-27 NOTE — Progress Notes (Deleted)
Cardiology Office Note   Date:  03/27/2019   ID:  Kristina Jordan, DOB [birth year], MRN [id number]  PCP:  Margo Common, PA  Cardiologist:   No primary care provider on file. Referring:  ***  No chief complaint on file.     History of Present Illness: Kristina Jordan is a 68 y.o. 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   A cardiac catheterization which demonstrated normal coronary arteries. Her ejection fraction was low normal at that time although it was normal on the last echo. She did have some mild mitral valve prolapse with only mild regurgitation. She had a septal aneurysm with fenestrations incidentally noted. Since I last saw her over three years ago ***    she has done well.  The patient denies any new symptoms such as chest discomfort, neck or arm discomfort. There has been no new shortness of breath, PND or orthopnea. There has been no presyncope or syncope.  She does not feel palpitations .  She exercises a couple of times per week. She stays active at home with her grandchildren.   Past Medical History:  Diagnosis Date   Hyperlipidemia    MITRAL INSUFFICIENCY    MITRAL VALVE PROLAPSE     Past Surgical History:  Procedure Laterality Date   ABDOMINAL HYSTERECTOMY     BREAST BIOPSY Left    neg   BREAST BIOPSY Left    stereo- neg   CERVICAL SPINE SURGERY     COLONOSCOPY WITH PROPOFOL N/A 12/02/2016   Procedure: COLONOSCOPY WITH PROPOFOL;  Surgeon: [name], MD;  Location: [facility] ENDOSCOPY;  Service: Endoscopy;  Laterality: N/A;   LEFT HEART CATHETERIZATION WITH CORONARY ANGIOGRAM N/A 09/10/2011   Procedure: LEFT HEART CATHETERIZATION WITH CORONARY ANGIOGRAM;  Surgeon: Wellington Hampshire, MD;  Location: [facility];  Service: Cardiovascular;  Laterality: N/A;     Current Outpatient Medications  Medication Sig Dispense Refill   alendronate (FOSAMAX) 70 MG tablet TAKE 1 TABLET BY MOUTH ONCE A WEEK WITH  A  FULL   GLASS  OF  WATER  ON  AN  EMPTY  STOMACH.  DO  NOT  LIE  DOWN  UNTIL  AFTER  BREAKFAST 12 tablet 3   aspirin 81 MG tablet Take 81 mg by mouth daily.     CALCIUM CITRATE PO Take 600 mg by mouth 2 (two) times daily.     Cholecalciferol (VITAMIN D-3) 1000 UNITS CAPS Take 2,000 Units by mouth once.     diltiazem (CARDIZEM) 120 MG tablet TAKE 1 TABLET BY MOUTH ONCE DAILY 90 tablet 3   propranolol (INDERAL) 40 MG tablet TAKE 1 TABLET BY MOUTH TWICE DAILY 180 tablet 3   No current facility-administered medications for this visit.     Allergies:   Sulfonamide derivatives    Social History:  The patient  reports that she has quit smoking. Her smoking use included cigarettes. She has never used smokeless tobacco. She reports that she does not drink alcohol or use drugs.   Family History:  The patient's ***family history includes Cancer in her brother and sister; Coronary artery disease in an other family member; Heart attack in her mother; Multiple sclerosis in her sister; Thyroid disease in an other family member.    ROS:  Please see the history of present illness.   Otherwise, review of systems are positive for {NONE DEFAULTED:18576::"none"}.   All other systems are reviewed and negative.  PHYSICAL EXAM: VS:  There were no vitals taken for this visit. , BMI There is no height or weight on file to calculate BMI. GENERAL:  Well appearing HEENT:  Pupils equal round and reactive, fundi not visualized, oral mucosa unremarkable NECK:  No jugular venous distention, waveform within normal limits, carotid upstroke brisk and symmetric, no bruits, no thyromegaly LYMPHATICS:  No cervical, inguinal adenopathy LUNGS:  Clear to auscultation bilaterally BACK:  No CVA tenderness CHEST:  Unremarkable HEART:  PMI not displaced or sustained,S1 and S2 within normal limits, no S3, no S4, no clicks, no rubs, *** murmurs ABD:  Flat, positive bowel sounds normal in frequency in pitch, no bruits, no rebound, no  guarding, no midline pulsatile mass, no hepatomegaly, no splenomegaly EXT:  2 plus pulses throughout, no edema, no cyanosis no clubbing SKIN:  No rashes no nodules NEURO:  Cranial nerves II through XII grossly intact, motor grossly intact throughout PSYCH:  Cognitively intact, oriented to person place and time    EKG:  EKG {ACTION; IS/IS DHR:[id number]} ordered today. The ekg ordered today demonstrates ***   Recent Labs: 11/23/2018: ALT 23; BUN 17; Creatinine, Ser 0.84; Hemoglobin 12.6; Platelets 242; Potassium 4.2; Sodium 145; TSH 1.200    Lipid Panel    Component Value Date/Time   CHOL 172 11/23/2018 1434   TRIG 81 11/23/2018 1434   HDL 79 11/23/2018 1434   CHOLHDL 2.2 11/23/2018 1434   CHOLHDL 2.5 09/09/2011 1646   VLDL 29 09/09/2011 1646   LDLCALC 77 11/23/2018 1434      Wt Readings from Last 3 Encounters:  11/29/18 126 lb 12.8 oz (57.5 kg)  11/23/18 126 lb (57.2 kg)  02/12/18 128 lb 9.6 oz (58.3 kg)      Other studies Reviewed: Additional studies/ records that were reviewed today include: ***. Review of the above records demonstrates:  Please see elsewhere in the note.  ***   ASSESSMENT AND PLAN:  MITRAL VALVE PROLAPSE -  ***  I will follow up with an echocardiogram in 18 .    CARDIOMYOPATHY Her EF was ***normal on the last echo.  No change in therapy is indicated.   SEPTAL ANEURYSM ***  We discussed this.  We will follow this on echo in 18 months.    Current medicines are reviewed at length with the patient today.  The patient {ACTIONS; HAS/DOES NOT HAVE:19233} concerns regarding medicines.  The following changes have been made:  {PLAN; NO CHANGE:13088:s}  Labs/ tests ordered today include: *** No orders of the defined types were placed in this encounter.    Disposition:   FU with ***    Signed, Minus Breeding, MD  03/27/2019 1:53 PM    [facility]

## 2019-03-29 ENCOUNTER — Ambulatory Visit: Payer: Medicare HMO | Admitting: Cardiology

## 2019-04-05 ENCOUNTER — Telehealth: Payer: Self-pay

## 2019-04-05 NOTE — Progress Notes (Signed)
Cardiology Office Note   Date:  04/06/2019   ID:  Kristina Jordan, DOB October 24, 1950, MRN [id number]  PCP:  Margo Common, PA  Cardiologist:   Minus Breeding, MD   Chief Complaint  Patient presents with  . Mitral Valve Prolapse      History of Present Illness: Kristina Jordan is a 68 y.o. 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   A cardiac catheterization which demonstrated normal coronary arteries. Her ejection fraction was low normal at that time although it was normal on the last echo. She did have some mild mitral valve prolapse with only mild regurgitation. She had a septal aneurysm with fenestrations incidentally noted.   Since I last saw her she has done well.  The patient denies any new symptoms such as chest discomfort, neck or arm discomfort. There has been no new shortness of breath, PND or orthopnea. There have been no reported palpitations, presyncope or syncope.  She has had a couple of episodes where her blood pressure has been a little low.  She might of had a couple of episodes of lightheadedness but overall she feels well and has had no issues.  He does have household chores including pushing a lawnmower at times.   Past Medical History:  Diagnosis Date  . Hyperlipidemia   . MITRAL INSUFFICIENCY   . MITRAL VALVE PROLAPSE     Past Surgical History:  Procedure Laterality Date  . ABDOMINAL HYSTERECTOMY    . BREAST BIOPSY Left    neg  . BREAST BIOPSY Left    stereo- neg  . CERVICAL SPINE SURGERY    . COLONOSCOPY WITH PROPOFOL N/A 12/02/2016   Procedure: COLONOSCOPY WITH PROPOFOL;  Surgeon: [name], MD;  Location: [facility] ENDOSCOPY;  Service: Endoscopy;  Laterality: N/A;  . LEFT HEART CATHETERIZATION WITH CORONARY ANGIOGRAM N/A 09/10/2011   Procedure: LEFT HEART CATHETERIZATION WITH CORONARY ANGIOGRAM;  Surgeon: Wellington Hampshire, MD;  Location: [place];  Service: Cardiovascular;  Laterality: N/A;     Current  Outpatient Medications  Medication Sig Dispense Refill  . alendronate (FOSAMAX) 70 MG tablet TAKE 1 TABLET BY MOUTH ONCE A WEEK WITH  A  FULL  GLASS  OF  WATER  ON  AN  EMPTY  STOMACH.  DO  NOT  LIE  DOWN  UNTIL  AFTER  BREAKFAST 12 tablet 3  . aspirin 81 MG tablet Take 81 mg by mouth daily.    Marland Kitchen CALCIUM CITRATE PO Take 600 mg by mouth 2 (two) times daily.    . Cholecalciferol (VITAMIN D-3) 1000 UNITS CAPS Take 2,000 Units by mouth once.    . diltiazem (CARDIZEM) 120 MG tablet TAKE 1 TABLET BY MOUTH ONCE DAILY 90 tablet 3  . propranolol (INDERAL) 40 MG tablet TAKE 1 TABLET BY MOUTH TWICE DAILY 180 tablet 3   No current facility-administered medications for this visit.     Allergies:   Sulfonamide derivatives    ROS:  Please see the history of present illness.   Otherwise, review of systems are positive for none.   All other systems are reviewed and negative.    PHYSICAL EXAM: VS:  BP (!) 99/57   Pulse 66   Temp 98.4 F (36.9 C) (Temporal)   Ht 5\' 5"  (1.651 m)   Wt 128 lb 9.6 oz (58.3 kg)   SpO2 99%   BMI 21.40 kg/m  , BMI Body mass index is 21.4 kg/m. GENERAL:  Well appearing NECK:  No jugular venous distention, waveform within normal limits, carotid upstroke brisk and symmetric, no bruits, no thyromegaly LUNGS:  Clear to auscultation bilaterally BACK:  No CVA tenderness CHEST:  Unremarkable HEART:  PMI not displaced or sustained,S1 and S2 within normal limits, no S3, no S4, no clicks, no rubs, no murmurs ABD:  Flat, positive bowel sounds normal in frequency in pitch, no bruits, no rebound, no guarding, no midline pulsatile mass, no hepatomegaly, no splenomegaly EXT:  2 plus pulses throughout, no edema, no cyanosis no clubbing    EKG:  EKG is ordered today. The ekg ordered today demonstrates sinus rhythm, premature atrial contractions, axis within normal limits, intervals within normal limits, no acute changes.   Recent Labs: 11/23/2018: ALT 23; BUN 17; Creatinine, Ser  0.84; Hemoglobin 12.6; Platelets 242; Potassium 4.2; Sodium 145; TSH 1.200    Lipid Panel    Component Value Date/Time   CHOL 172 11/23/2018 1434   TRIG 81 11/23/2018 1434   HDL 79 11/23/2018 1434   CHOLHDL 2.2 11/23/2018 1434   CHOLHDL 2.5 09/09/2011 1646   VLDL 29 09/09/2011 1646   LDLCALC 77 11/23/2018 1434      Wt Readings from Last 3 Encounters:  04/06/19 128 lb 9.6 oz (58.3 kg)  11/29/18 126 lb 12.8 oz (57.5 kg)  11/23/18 126 lb (57.2 kg)      Other studies Reviewed: Additional studies/ records that were reviewed today include: None. Review of the above records demonstrates:  Please see elsewhere in the note.     ASSESSMENT AND PLAN:  MITRAL VALVE PROLAPSE -  I hear no difference in her exam today.  I do not think further echocardiography is indicated.  We will follow this clinically.   CARDIOMYOPATHY Her EF was normal on the last echo.  I am not suggesting any further changes.  No change in therapy.    SEPTAL ANEURYSM This was an incidental finding.  No further imaging is indicated.  HYPOTENSION:   She has no symptoms related to this.  Should she get symptomatic I would need to back off on some of her medicines.   Current medicines are reviewed at length with the patient today.  The patient does not have concerns regarding medicines.  The following changes have been made:  no change  Labs/ tests ordered today include: None No orders of the defined types were placed in this encounter.    Disposition:   FU with me in 18 months.     Signed, Minus Breeding, MD  04/06/2019 2:06 PM    [place]

## 2019-04-05 NOTE — Telephone Encounter (Signed)
    COVID-19 Pre-Screening Questions:  . In the past 7 to 10 days have you had a cough,  shortness of breath, headache, congestion, fever (100 or greater) body aches, chills, sore throat, or sudden loss of taste or sense of smell? NO . Have you been around anyone with known Covid 19. NO . Have you been around anyone who is awaiting Covid 19 test results in the past 7 to 10 days? NO . Have you been around anyone who has been exposed to Covid 19, or has mentioned symptoms of Covid 19 within the past 7 to 10 days? NO  PT WILL ARRIVE EARLY W/MASK AND NO VISITORS           

## 2019-04-06 ENCOUNTER — Encounter (INDEPENDENT_AMBULATORY_CARE_PROVIDER_SITE_OTHER): Payer: Self-pay

## 2019-04-06 ENCOUNTER — Ambulatory Visit: Payer: Medicare HMO | Admitting: Cardiology

## 2019-04-06 ENCOUNTER — Telehealth: Payer: Self-pay | Admitting: Cardiology

## 2019-04-06 ENCOUNTER — Other Ambulatory Visit: Payer: Self-pay

## 2019-04-06 ENCOUNTER — Encounter: Payer: Self-pay | Admitting: Cardiology

## 2019-04-06 VITALS — BP 99/57 | HR 66 | Temp 98.4°F | Ht 65.0 in | Wt 128.6 lb

## 2019-04-06 DIAGNOSIS — I341 Nonrheumatic mitral (valve) prolapse: Secondary | ICD-10-CM | POA: Diagnosis not present

## 2019-04-06 DIAGNOSIS — I959 Hypotension, unspecified: Secondary | ICD-10-CM

## 2019-04-06 DIAGNOSIS — I080 Rheumatic disorders of both mitral and aortic valves: Secondary | ICD-10-CM

## 2019-04-06 DIAGNOSIS — I253 Aneurysm of heart: Secondary | ICD-10-CM | POA: Diagnosis not present

## 2019-04-06 NOTE — Telephone Encounter (Signed)
I called pt to confirm  Her appt for 04-06-19 with Dr Percival Spanish. I also asked pt to call back to be pre-screened for COVID-19.

## 2019-04-06 NOTE — Patient Instructions (Signed)
Follow-Up: You will need a follow up appointment in 18 months.  Please call our office 2 months in advance,07-2020 to schedule this appointment.  You may see Minus Breeding, MD or one of the following Advanced Practice Providers on your designated Care Team:  Rosaria Ferries, PA-C   Jory Sims, DNP, ANP       Medication Instructions:  The current medical regimen is effective;  continue present plan and medications as directed. Please refer to the Current Medication list given to you today. If you need a refill on your cardiac medications before your next appointment, please call your pharmacy. Labwork: When you have labs (blood work) and your tests are completely normal, you will receive your results ONLY by Anthony (if you have MyChart) -OR- A paper copy in the mail.  At [facility], you and your health needs are our priority.  As part of our continuing mission to provide you with exceptional heart care, we have created designated Provider Care Teams.  These Care Teams include your primary Cardiologist (physician) and Advanced Practice Providers (APPs -  Physician Assistants and Nurse Practitioners) who all work together to provide you with the care you need, when you need it.  Thank you for choosing [facility] at [facility]!!

## 2019-04-23 ENCOUNTER — Other Ambulatory Visit: Payer: Self-pay | Admitting: Family Medicine

## 2019-04-25 ENCOUNTER — Ambulatory Visit
Admission: RE | Admit: 2019-04-25 | Discharge: 2019-04-25 | Disposition: A | Discharge status: Home / Self Care | Payer: Medicare HMO | Source: Ambulatory Visit | Attending: Family Medicine | Admitting: Family Medicine

## 2019-04-25 DIAGNOSIS — Z1231 Encounter for screening mammogram for malignant neoplasm of breast: Secondary | ICD-10-CM | POA: Diagnosis not present

## 2019-04-26 ENCOUNTER — Telehealth: Payer: Self-pay

## 2019-04-26 NOTE — Telephone Encounter (Signed)
Patient advised as below.  

## 2019-04-26 NOTE — Telephone Encounter (Signed)
-----   Message from Margo Common, [place] sent at 04/25/2019  2:30 PM EDT ----- Normal mammograms without signs of malignancy. Recheck in 2 years.

## 2019-05-19 DIAGNOSIS — R69 Illness, unspecified: Secondary | ICD-10-CM | POA: Diagnosis not present

## 2019-06-29 ENCOUNTER — Ambulatory Visit (INDEPENDENT_AMBULATORY_CARE_PROVIDER_SITE_OTHER): Payer: Medicare HMO

## 2019-06-29 ENCOUNTER — Other Ambulatory Visit: Payer: Self-pay

## 2019-06-29 DIAGNOSIS — Z23 Encounter for immunization: Secondary | ICD-10-CM

## 2019-07-12 ENCOUNTER — Other Ambulatory Visit: Payer: Self-pay | Admitting: Family Medicine

## 2019-08-02 ENCOUNTER — Other Ambulatory Visit: Payer: Self-pay | Admitting: Family Medicine

## 2019-08-02 DIAGNOSIS — D2271 Melanocytic nevi of right lower limb, including hip: Secondary | ICD-10-CM | POA: Diagnosis not present

## 2019-08-02 DIAGNOSIS — D2261 Melanocytic nevi of right upper limb, including shoulder: Secondary | ICD-10-CM | POA: Diagnosis not present

## 2019-08-02 DIAGNOSIS — D225 Melanocytic nevi of trunk: Secondary | ICD-10-CM | POA: Diagnosis not present

## 2019-08-02 DIAGNOSIS — D2272 Melanocytic nevi of left lower limb, including hip: Secondary | ICD-10-CM | POA: Diagnosis not present

## 2019-08-02 DIAGNOSIS — D2262 Melanocytic nevi of left upper limb, including shoulder: Secondary | ICD-10-CM | POA: Diagnosis not present

## 2019-08-12 ENCOUNTER — Ambulatory Visit: Payer: Self-pay | Admitting: *Deleted

## 2019-08-12 NOTE — Telephone Encounter (Signed)
Pt called in concerned she accidentally took 2 of her Inderal about 10:00 this morning.  No symptoms.   The [facility] is closed for the Thanksgiving holiday.    I suggested she call her pharmacist.   She has agreed to do that.   I let her know the number should be on the Rx bottle.    She found it and said,  "It is".   I let her know to call that number.   I instructed her to ask what signs and symptoms she needs to watch for and if she should take her evening dose or not.  I went over the signs and symptoms to watch for:  Dizziness, weakness, light headed, feel like passing out, to call 911.  She was agreeable to doing this.    Reason for Disposition . [1] DOUBLE DOSE (an extra dose or lesser amount) of prescription drug AND [2] NO symptoms (Exception: a double dose of antibiotics)    I took 2 Inderal tablets this morning.  Answer Assessment - Initial Assessment Questions 1.   NAME of MEDICATION: "What medicine are you calling about?"     Inderal 40 mg. 2.   QUESTION: "What is your question?"     I took an extra Inderal at 10:00 AM.    I fix my pills for the week.    I poured them out this morning and I guess I took the other one without thinking.    I think I took it without thinking. 3.   PRESCRIBING HCP: "Who prescribed it?" Reason: if prescribed by specialist, call should be referred to that group.     Dennis Chrismon, PA. 4. SYMPTOMS: "Do you have any symptoms?"     No symptoms at all. 5. SEVERITY: If symptoms are present, ask "Are they mild, moderate or severe?"     None 6.  PREGNANCY:  "Is there any chance that you are pregnant?" "When was your last menstrual period?"     N/A due to age  Protocols used: MEDICATION QUESTION CALL-A-AH

## 2019-11-21 DIAGNOSIS — R69 Illness, unspecified: Secondary | ICD-10-CM | POA: Diagnosis not present

## 2019-11-30 NOTE — Progress Notes (Addendum)
Subjective:   Kristina Jordan is a 69 y.o. female who presents for Medicare Annual (Subsequent) preventive examination.    This visit is being conducted through telemedicine due to the COVID-19 pandemic. This patient has given me verbal consent via doximity to conduct this visit, patient states they are participating from their home address. Some vital signs may be absent or patient reported.    Patient identification: identified by name, DOB, and current address  Review of Systems:  N/A  Cardiac Risk Factors include: advanced age (>103men, >3 women)     Objective:     Vitals: There were no vitals taken for this visit.  There is no height or weight on file to calculate BMI. Unable to obtain vitals due to visit being conducted via telephonically.   Advanced Directives 12/01/2019 11/29/2018 11/26/2017 12/02/2016 11/07/2016 09/10/2011 09/09/2011  Does Patient Have a Medical Advance Directive? Yes Yes Yes Yes Yes Patient does not have advance directive Patient would like information  Type of Advance Directive Living will [place];Living will Living will Living will Out of facility DNR (pink MOST or yellow form) - -  Copy of [place] in Chart? - No - copy requested - - - - -  Would patient like information on creating a medical advance directive? - - - - Yes (ED - Information included in AVS) Advance directive packet given Referral made to social work  Pre-existing out of facility DNR order (yellow form or pink MOST form) - - - - - No No    Tobacco Social History   Tobacco Use  Smoking Status Former Smoker   Types: Cigarettes  Smokeless Tobacco Never Used  Tobacco Comment   only socially     Counseling given: Not Answered Comment: only socially   Clinical Intake:  Pre-visit preparation completed: Yes  Pain : No/denies pain Pain Score: 0-No pain     Nutritional Risks: None Diabetes: No  How often do you need to have someone help  you when you read instructions, pamphlets, or other written materials from your doctor or pharmacy?: 1 - Never  Interpreter Needed?: No  Information entered by :: [facility], LPN  Past Medical History:  Diagnosis Date   Hyperlipidemia    MITRAL INSUFFICIENCY    MITRAL VALVE PROLAPSE    Past Surgical History:  Procedure Laterality Date   ABDOMINAL HYSTERECTOMY     BREAST BIOPSY Left    stereo- neg   BREAST EXCISIONAL BIOPSY Left    neg   CERVICAL SPINE SURGERY     COLONOSCOPY WITH PROPOFOL N/A 12/02/2016   Procedure: COLONOSCOPY WITH PROPOFOL;  Surgeon: [name], MD;  Location: [facility] ENDOSCOPY;  Service: Endoscopy;  Laterality: N/A;   LEFT HEART CATHETERIZATION WITH CORONARY ANGIOGRAM N/A 09/10/2011   Procedure: LEFT HEART CATHETERIZATION WITH CORONARY ANGIOGRAM;  Surgeon: Wellington Hampshire, MD;  Location: [facility];  Service: Cardiovascular;  Laterality: N/A;   Family History  Problem Relation Age of Onset   Multiple sclerosis Sister    Cancer Brother        bladder   Heart attack Mother    Cancer Sister        bladder   Coronary artery disease Other    Thyroid disease Other    Breast cancer Neg Hx    Social History   Socioeconomic History   Marital status: Married    Spouse name: Not on file   Number of children: 2   Years of  education: Not on file   Highest education level: 12th grade  Occupational History   Occupation: retired  Tobacco Use   Smoking status: Former Smoker    Types: Cigarettes   Smokeless tobacco: Never Used   Tobacco comment: only socially  Substance and Sexual Activity   Alcohol use: No   Drug use: No   Sexual activity: Yes  Other Topics Concern   Not on file  Social History Narrative   Not on file   Social Determinants of Health   Financial Resource Strain: Low Risk    Difficulty of Paying Living Expenses: Not hard at all  Food Insecurity: No Food Insecurity   Worried About Charity fundraiser in the Last Year: Never true   [place] in the Last Year: Never true  Transportation Needs: No Transportation Needs   Lack of Transportation (Medical): No   Lack of Transportation (Non-Medical): No  Physical Activity: Insufficiently Active   Days of Exercise per Week: 3 days   Minutes of Exercise per Session: 30 min  Stress: No Stress Concern Present   Feeling of Stress : Not at all  Social Connections: Slightly Isolated   Frequency of Communication with Friends and Family: Three times a week   Frequency of Social Gatherings with Friends and Family: Once a week   Attends Religious Services: More than 4 times per year   Active Member of [employer] or Organizations: No   Attends Archivist Meetings: Never   Marital Status: Married    Outpatient Encounter Medications as of 12/01/2019  Medication Sig   alendronate (FOSAMAX) 70 MG tablet TAKE 1 TABLET BY MOUTH ONCE A WEEK WITH  A  FULL  GLASS  OF  WATER  ON  AN  EMPTY  STOMACH.  DO  NOT  LIE  DOWN  UNTIL  AFTER  BREAKFAST   aspirin 81 MG tablet Take 81 mg by mouth daily.   CALCIUM CITRATE PO Take 600 mg by mouth 2 (two) times daily.   Cholecalciferol (VITAMIN D-3) 1000 UNITS CAPS Take 2,000 Units by mouth daily.    diltiazem (CARDIZEM) 120 MG tablet Take 1 tablet by mouth once daily   propranolol (INDERAL) 40 MG tablet Take 1 tablet by mouth twice daily   No facility-administered encounter medications on file as of 12/01/2019.    Activities of Daily Living In your present state of health, do you have any difficulty performing the following activities: 12/01/2019  Hearing? N  Vision? N  Difficulty concentrating or making decisions? N  Walking or climbing stairs? N  Dressing or bathing? N  Doing errands, shopping? N  Preparing Food and eating ? N  Using the Toilet? N  In the past six months, have you accidently leaked urine? Y  Comment occasionally  Do you have problems with loss of bowel control? N  Managing your Medications? N  Managing your Finances? N   Housekeeping or managing your Housekeeping? N  Some recent data might be hidden    Patient Care Team: Chrismon, Vickki Muff, PA as PCP - General (Family Medicine) Minus Breeding, MD as PCP - Cardiology (Cardiology) Dingeldein, Remo Lipps, MD as Consulting Physician (Ophthalmology) Dasher, Rayvon Char, MD (Dermatology)    Assessment:   This is a routine wellness examination for [place].  Exercise Activities and Dietary recommendations Current Exercise Habits: Home exercise routine, Type of exercise: Other - see comments(rides a stationary bike at home), Time (Minutes): 30, Frequency (Times/Week): 3, Weekly Exercise (Minutes/Week):  90, Intensity: Mild, Exercise limited by: None identified  Goals      DIET - INCREASE WATER INTAKE     Recommend increasing water intake to 4-6 glasses a day.         Fall Risk: Fall Risk  12/01/2019 11/29/2018 11/26/2017 11/07/2016 11/19/2015  Falls in the past year? 0 0 No No No  Number falls in past yr: 0 - - - -  Injury with Fall? 0 - - - -    FALL RISK PREVENTION PERTAINING TO THE HOME:  Any stairs in or around the home? No  If so, are there any without handrails?  N/A  Home free of loose throw rugs in walkways, pet beds, electrical cords, etc? Yes  Adequate lighting in your home to reduce risk of falls? Yes   ASSISTIVE DEVICES UTILIZED TO PREVENT FALLS:  Life alert? No  Use of a cane, walker or w/c? No  Grab bars in the bathroom? No  Shower chair or bench in shower? No  Elevated toilet seat or a handicapped toilet? No    TIMED UP AND GO:  Was the test performed? No .    Depression Screen PHQ 2/9 Scores 12/01/2019 11/29/2018 11/26/2017 11/07/2016  PHQ - 2 Score 0 2 1 0  PHQ- 9 Score - 2 4 -     Cognitive Function: Declined today.     6CIT Screen 11/07/2016  What Year? 0 points  What month? 0 points  What time? 0 points  Count back from 20 0 points  Months in reverse 0 points  Repeat phrase 4 points  Total Score 4    Immunization  History  Administered Date(s) Administered   Fluad Quad(high Dose 65+) 06/29/2019   H1N1 10/12/2008   Influenza, High Dose Seasonal PF 07/31/2014, 08/02/2015, 07/18/2016, 08/04/2017, 07/14/2018   Pneumococcal Conjugate-13 11/17/2014   Pneumococcal Polysaccharide-23 06/02/2011, 11/07/2016   Tdap 10/21/2010   Zoster 10/27/2011    Qualifies for Shingles Vaccine? Yes  Zostavax completed 10/27/11. Due for Shingrix. Pt has been advised to call insurance company to determine out of pocket expense. Advised may also receive vaccine at local pharmacy or Health Dept. Verbalized acceptance and understanding.  Tdap: Up to date  Flu Vaccine: Up to date  Pneumococcal Vaccine: Completed series  Screening Tests Health Maintenance  Topic Date Due   DEXA SCAN  01/05/2020   TETANUS/TDAP  10/21/2020   MAMMOGRAM  04/24/2021   COLONOSCOPY  12/03/2026   INFLUENZA VACCINE  Completed   Hepatitis C Screening  Completed   PNA vac Low Risk Adult  Completed    Cancer Screenings:  Colorectal Screening: Completed 12/02/16. Repeat every 10 years.   Mammogram: Completed 04/25/19. Repeat every 1-2 years as advised.   Bone Density: Completed 01/04/18. Results reflect OSTEOPOROSIS. Repeat every 5 years.   Lung Cancer Screening: (Low Dose CT Chest recommended if Age 16-80 years, 30 pack-year currently smoking OR have quit w/in 15years.) does not qualify.   Additional Screening:  Hepatitis C Screening Up to date  Vision Screening: Recommended annual ophthalmology exams for early detection of glaucoma and other disorders of the eye.  Dental Screening: Recommended annual dental exams for proper oral hygiene  Community Resource Referral:  CRR required this visit?  No       Plan:  I have personally reviewed and addressed the Medicare Annual Wellness questionnaire and have noted the following in the patient's chart:  A. Medical and social history B. Use of alcohol, tobacco or illicit drugs  C. Current  medications and supplements D. Functional ability and status E.  Nutritional status F.  Physical activity G. Advance directives H. List of other physicians I.  Hospitalizations, surgeries, and ER visits in previous 12 months J.  [place] such as hearing and vision if needed, cognitive and depression L. Referrals and appointments   In addition, I have reviewed and discussed with patient certain preventive protocols, quality metrics, and best practice recommendations. A written personalized care plan for preventive services as well as general preventive health recommendations were provided to patient. Nurse Health Advisor  Signed,    Xinyi Batton [place], [place]  [phone] Nurse Health Advisor   Nurse Notes: None.  Reviewed note and plan by Nurse Health Advisor. Was available for consultation. Agree with documentation and recommendations.

## 2019-12-01 ENCOUNTER — Ambulatory Visit: Payer: Medicare HMO

## 2019-12-01 ENCOUNTER — Encounter: Payer: Self-pay | Admitting: Family Medicine

## 2019-12-01 ENCOUNTER — Other Ambulatory Visit: Payer: Self-pay

## 2019-12-01 ENCOUNTER — Ambulatory Visit (INDEPENDENT_AMBULATORY_CARE_PROVIDER_SITE_OTHER): Payer: Medicare HMO | Admitting: Family Medicine

## 2019-12-01 ENCOUNTER — Ambulatory Visit (INDEPENDENT_AMBULATORY_CARE_PROVIDER_SITE_OTHER): Payer: Medicare HMO

## 2019-12-01 VITALS — BP 107/65 | HR 55 | Temp 96.9°F | Ht 65.0 in | Wt 131.4 lb

## 2019-12-01 DIAGNOSIS — Z0000 Encounter for general adult medical examination without abnormal findings: Secondary | ICD-10-CM | POA: Diagnosis not present

## 2019-12-01 DIAGNOSIS — I341 Nonrheumatic mitral (valve) prolapse: Secondary | ICD-10-CM | POA: Diagnosis not present

## 2019-12-01 DIAGNOSIS — E7800 Pure hypercholesterolemia, unspecified: Secondary | ICD-10-CM

## 2019-12-01 DIAGNOSIS — Z8679 Personal history of other diseases of the circulatory system: Secondary | ICD-10-CM

## 2019-12-01 DIAGNOSIS — M810 Age-related osteoporosis without current pathological fracture: Secondary | ICD-10-CM

## 2019-12-01 DIAGNOSIS — E559 Vitamin D deficiency, unspecified: Secondary | ICD-10-CM | POA: Diagnosis not present

## 2019-12-01 NOTE — Patient Instructions (Signed)
Kristina Jordan , Thank you for taking time to come for your Medicare Wellness Visit. I appreciate your ongoing commitment to your health goals. Please review the following plan we discussed and let me know if I can assist you in the future.   Screening recommendations/referrals: Colonoscopy: Up to date, due 11/2026 Mammogram: Up to date, due 04/2021 Bone Density: Up to date, due 12/2022 Recommended yearly ophthalmology/optometry visit for glaucoma screening and checkup Recommended yearly dental visit for hygiene and checkup  Vaccinations: Influenza vaccine: Up to date Pneumococcal vaccine: Completed series Tdap vaccine: Up to date Shingles vaccine: Pt declines today.     Advanced directives: Please bring a copy of your POA (Power of Attorney) and/or Living Will to your next appointment.   Conditions/risks identified: Recommend increasing water intake to 6-8 8 oz glasses a day.   Next appointment: 10:40 AM today with Vernie Murders    Preventive Care 18 Years and Older, Female Preventive care refers to lifestyle choices and visits with your health care provider that can promote health and wellness. What does preventive care include?  A yearly physical exam. This is also called an annual well check.  Dental exams once or twice a year.  Routine eye exams. Ask your health care provider how often you should have your eyes checked.  Personal lifestyle choices, including:  Daily care of your teeth and gums.  Regular physical activity.  Eating a healthy diet.  Avoiding tobacco and drug use.  Limiting alcohol use.  Practicing safe sex.  Taking low-dose aspirin every day.  Taking vitamin and mineral supplements as recommended by your health care provider. What happens during an annual well check? The services and screenings done by your health care provider during your annual well check will depend on your age, overall health, lifestyle risk factors, and family history of  disease. Counseling  Your health care provider may ask you questions about your:  Alcohol use.  Tobacco use.  Drug use.  Emotional well-being.  Home and relationship well-being.  Sexual activity.  Eating habits.  History of falls.  Memory and ability to understand (cognition).  Work and work Statistician.  Reproductive health. Screening  You may have the following tests or measurements:  Height, weight, and BMI.  Blood pressure.  Lipid and cholesterol levels. These may be checked every 5 years, or more frequently if you are over 22 years old.  Skin check.  Lung cancer screening. You may have this screening every year starting at age 27 if you have a 30-pack-year history of smoking and currently smoke or have quit within the past 15 years.  Fecal occult blood test (FOBT) of the stool. You may have this test every year starting at age 60.  Flexible sigmoidoscopy or colonoscopy. You may have a sigmoidoscopy every 5 years or a colonoscopy every 10 years starting at age 14.  Hepatitis C blood test.  Hepatitis B blood test.  Sexually transmitted disease (STD) testing.  Diabetes screening. This is done by checking your blood sugar (glucose) after you have not eaten for a while (fasting). You may have this done every 1-3 years.  Bone density scan. This is done to screen for osteoporosis. You may have this done starting at age 6.  Mammogram. This may be done every 1-2 years. Talk to your health care provider about how often you should have regular mammograms. Talk with your health care provider about your test results, treatment options, and if necessary, the need for more tests. Vaccines  Your health care provider may recommend certain vaccines, such as:  Influenza vaccine. This is recommended every year.  Tetanus, diphtheria, and acellular pertussis (Tdap, Td) vaccine. You may need a Td booster every 10 years.  Zoster vaccine. You may need this after age  74.  Pneumococcal 13-valent conjugate (PCV13) vaccine. One dose is recommended after age 19.  Pneumococcal polysaccharide (PPSV23) vaccine. One dose is recommended after age 2. Talk to your health care provider about which screenings and vaccines you need and how often you need them. This information is not intended to replace advice given to you by your health care provider. Make sure you discuss any questions you have with your health care provider. Document Released: 09/28/2015 Document Revised: 05/21/2016 Document Reviewed: 07/03/2015 Elsevier Interactive Patient Education  2017 [place] Prevention in the Home Falls can cause injuries. They can happen to people of all ages. There are many things you can do to make your home safe and to help prevent falls. What can I do on the outside of my home?  Regularly fix the edges of walkways and driveways and fix any cracks.  Remove anything that might make you trip as you walk through a door, such as a raised step or threshold.  Trim any bushes or trees on the path to your home.  Use bright outdoor lighting.  Clear any walking paths of anything that might make someone trip, such as rocks or tools.  Regularly check to see if handrails are loose or broken. Make sure that both sides of any steps have handrails.  Any raised decks and porches should have guardrails on the edges.  Have any leaves, snow, or ice cleared regularly.  Use sand or salt on walking paths during winter.  Clean up any spills in your garage right away. This includes oil or grease spills. What can I do in the bathroom?  Use night lights.  Install grab bars by the toilet and in the tub and shower. Do not use towel bars as grab bars.  Use non-skid mats or decals in the tub or shower.  If you need to sit down in the shower, use a plastic, non-slip stool.  Keep the floor dry. Clean up any water that spills on the floor as soon as it happens.  Remove  soap buildup in the tub or shower regularly.  Attach bath mats securely with double-sided non-slip rug tape.  Do not have throw rugs and other things on the floor that can make you trip. What can I do in the bedroom?  Use night lights.  Make sure that you have a light by your bed that is easy to reach.  Do not use any sheets or blankets that are too big for your bed. They should not hang down onto the floor.  Have a firm chair that has side arms. You can use this for support while you get dressed.  Do not have throw rugs and other things on the floor that can make you trip. What can I do in the kitchen?  Clean up any spills right away.  Avoid walking on wet floors.  Keep items that you use a lot in easy-to-reach places.  If you need to reach something above you, use a strong step stool that has a grab bar.  Keep electrical cords out of the way.  Do not use floor polish or wax that makes floors slippery. If you must use wax, use non-skid floor wax.  Do  not have throw rugs and other things on the floor that can make you trip. What can I do with my stairs?  Do not leave any items on the stairs.  Make sure that there are handrails on both sides of the stairs and use them. Fix handrails that are broken or loose. Make sure that handrails are as long as the stairways.  Check any carpeting to make sure that it is firmly attached to the stairs. Fix any carpet that is loose or worn.  Avoid having throw rugs at the top or bottom of the stairs. If you do have throw rugs, attach them to the floor with carpet tape.  Make sure that you have a light switch at the top of the stairs and the bottom of the stairs. If you do not have them, ask someone to add them for you. What else can I do to help prevent falls?  Wear shoes that:  Do not have high heels.  Have rubber bottoms.  Are comfortable and fit you well.  Are closed at the toe. Do not wear sandals.  If you use a  stepladder:  Make sure that it is fully opened. Do not climb a closed stepladder.  Make sure that both sides of the stepladder are locked into place.  Ask someone to hold it for you, if possible.  Clearly mark and make sure that you can see:  Any grab bars or handrails.  First and last steps.  Where the edge of each step is.  Use tools that help you move around (mobility aids) if they are needed. These include:  Canes.  Walkers.  Scooters.  Crutches.  Turn on the lights when you go into a dark area. Replace any light bulbs as soon as they burn out.  Set up your furniture so you have a clear path. Avoid moving your furniture around.  If any of your floors are uneven, fix them.  If there are any pets around you, be aware of where they are.  Review your medicines with your doctor. Some medicines can make you feel dizzy. This can increase your chance of falling. Ask your doctor what other things that you can do to help prevent falls. This information is not intended to replace advice given to you by your health care provider. Make sure you discuss any questions you have with your health care provider. Document Released: 06/28/2009 Document Revised: 02/07/2016 Document Reviewed: 10/06/2014 Elsevier Interactive Patient Education  2017 [employer].

## 2019-12-01 NOTE — Progress Notes (Signed)
Patient: Kristina Jordan, Female    DOB: 1951-04-13, 69 y.o.   MRN: [id number] Visit Date: 12/01/2019  Today's Provider: Vernie Murders, PA   Chief Complaint  Patient presents with  . Annual Exam   Subjective:     Patient had a AWE prior to appointment today   Annual physical exam Kristina Jordan is a 69 y.o. female who presents today for health maintenance and complete physical. She feels well. She reports exercising 3 days per week . She reports she is sleeping well.  -----------------------------------------------------------------   Review of Systems  Constitutional: Negative.   HENT: Negative.   Eyes: Negative.   Respiratory: Negative.   Cardiovascular: Negative.   Gastrointestinal: Negative.   Endocrine: Negative.   Genitourinary: Negative.   Musculoskeletal: Negative.   Skin: Negative.   Allergic/Immunologic: Negative.   Neurological: Negative.   Hematological: Negative.   Psychiatric/Behavioral: Negative.     Social History      She  reports that she has quit smoking. Her smoking use included cigarettes. She has never used smokeless tobacco. She reports that she does not drink alcohol or use drugs.       Social History   Socioeconomic History  . Marital status: Married    Spouse name: Not on file  . Number of children: 2  . Years of education: Not on file  . Highest education level: 12th grade  Occupational History  . Occupation: retired  Tobacco Use  . Smoking status: Former Smoker    Types: Cigarettes  . Smokeless tobacco: Never Used  . Tobacco comment: only socially  Substance and Sexual Activity  . Alcohol use: No  . Drug use: No  . Sexual activity: Yes  Other Topics Concern  . Not on file  Social History Narrative  . Not on file   Social Determinants of Health   Financial Resource Strain: Low Risk   . Difficulty of Paying Living Expenses: Not hard at all  Food Insecurity: No Food Insecurity  . Worried About Charity fundraiser  in the Last Year: Never true  . Ran Out of Food in the Last Year: Never true  Transportation Needs: No Transportation Needs  . Lack of Transportation (Medical): No  . Lack of Transportation (Non-Medical): No  Physical Activity: Insufficiently Active  . Days of Exercise per Week: 3 days  . Minutes of Exercise per Session: 30 min  Stress: No Stress Concern Present  . Feeling of Stress : Not at all  Social Connections: Slightly Isolated  . Frequency of Communication with Friends and Family: Three times a week  . Frequency of Social Gatherings with Friends and Family: Once a week  . Attends Religious Services: More than 4 times per year  . Active Member of Clubs or Organizations: No  . Attends Archivist Meetings: Never  . Marital Status: Married   Past Medical History:  Diagnosis Date  . Hyperlipidemia   . MITRAL INSUFFICIENCY   . MITRAL VALVE PROLAPSE    Patient Active Problem List   Diagnosis Date Noted  . Ganglion cyst of flexor tendon sheath of finger 12/30/2016  . Heme + stool   . Hypercholesterolemia without hypertriglyceridemia 11/19/2015  . Avitaminosis D 11/19/2015  . Atypical chest pain 09/09/2011  . Non-ST elevation MI (NSTEMI) ([place]) 09/09/2011  . 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 MITRAL INSUFFICIENCY 12/26/2009  . Mitral valve disorder 12/24/2009  . FIBRILLATION, ATRIAL 12/24/2009  . Heart disease 11/01/2009  .  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 Acquired trigger finger 01/02/2009  . Need for prophylactic hormone replacement therapy (postmenopausal) 10/09/2006  . OP (osteoporosis) 10/09/2006   Past Surgical History:  Procedure Laterality Date  . ABDOMINAL HYSTERECTOMY    . BREAST BIOPSY Left    stereo- neg  . BREAST EXCISIONAL BIOPSY Left    neg  . CERVICAL SPINE SURGERY    . COLONOSCOPY WITH PROPOFOL N/A 12/02/2016   Procedure: COLONOSCOPY WITH PROPOFOL;  Surgeon: [name], MD;  Location: [facility] ENDOSCOPY;  Service: Endoscopy;  Laterality: N/A;  . LEFT  HEART CATHETERIZATION WITH CORONARY ANGIOGRAM N/A 09/10/2011   Procedure: LEFT HEART CATHETERIZATION WITH CORONARY ANGIOGRAM;  Surgeon: Wellington Hampshire, MD;  Location: [facility];  Service: Cardiovascular;  Laterality: N/A;   Family History        Family Status  Relation Name Status  . Father  Deceased  . Sister  Deceased  . Brother  Alive  . Mother  Deceased  . Sister  Alive  . Sister  Alive  . Brother  Alive  . Brother  Alive  . Other  (Not Specified)  . Neg Hx  (Not Specified)        Her family history includes Cancer in her brother and sister; Coronary artery disease in an other family member; Heart attack in her mother; Multiple sclerosis in her sister; Thyroid disease in an other family member. There is no history of Breast cancer.     Allergies  Allergen Reactions  . Sulfonamide Derivatives Rash    Current Outpatient Medications:  .  alendronate (FOSAMAX) 70 MG tablet, TAKE 1 TABLET BY MOUTH ONCE A WEEK WITH  A  FULL  GLASS  OF  WATER  ON  AN  EMPTY  STOMACH.  DO  NOT  LIE  DOWN  UNTIL  AFTER  BREAKFAST, Disp: 12 tablet, Rfl: 3 .  aspirin 81 MG tablet, Take 81 mg by mouth daily., Disp: , Rfl:  .  CALCIUM CITRATE PO, Take 600 mg by mouth 2 (two) times daily., Disp: , Rfl:  .  Cholecalciferol (VITAMIN D-3) 1000 UNITS CAPS, Take 2,000 Units by mouth daily. , Disp: , Rfl:  .  diltiazem (CARDIZEM) 120 MG tablet, Take 1 tablet by mouth once daily, Disp: 90 tablet, Rfl: 3 .  propranolol (INDERAL) 40 MG tablet, Take 1 tablet by mouth twice daily, Disp: 180 tablet, Rfl: 3   Patient Care Team: Hafiz Irion, Vickki Muff, PA as PCP - General (Family Medicine) Minus Breeding, MD as PCP - Cardiology (Cardiology) Dingeldein, Remo Lipps, MD as Consulting Physician (Ophthalmology) Dasher, Rayvon Char, MD (Dermatology)   Objective:    Vitals: BP 107/65 (BP Location: Right Arm, Patient Position: Sitting, Cuff Size: Normal)   Pulse (!) 55   Temp (!) 96.9 F (36.1 C) (Temporal)   Ht 5\' 5"  (1.651  m)   Wt 131 lb 6.4 oz (59.6 kg)   BMI 21.87 kg/m    Vitals:   12/01/19 1046  BP: 107/65  Pulse: (!) 55  Temp: (!) 96.9 F (36.1 C)  TempSrc: Temporal  Weight: 131 lb 6.4 oz (59.6 kg)  Height: 5\' 5"  (1.651 m)   Body mass index is 21.87 kg/m.  Physical Exam Constitutional:      Appearance: She is well-developed.  HENT:     Head: Normocephalic and atraumatic.     Right Ear: External ear normal.     Left Ear: External ear normal.     Nose: Nose normal.  Eyes:     General:        Right eye: No discharge.     Conjunctiva/sclera: Conjunctivae normal.     Pupils: Pupils are equal, round, and reactive to light.  Neck:     Thyroid: No thyromegaly.     Trachea: No tracheal deviation.  Cardiovascular:     Rate and Rhythm: Normal rate and regular rhythm.     Heart sounds: Normal heart sounds. No murmur.  Pulmonary:     Effort: Pulmonary effort is normal. No respiratory distress.     Breath sounds: Normal breath sounds. No wheezing or rales.  Chest:     Chest wall: No tenderness.  Abdominal:     General: There is no distension.     Palpations: Abdomen is soft. There is no mass.     Tenderness: There is no abdominal tenderness. There is no guarding or rebound.  Musculoskeletal:        General: No tenderness. Normal range of motion.     Cervical back: Normal range of motion and neck supple.  Lymphadenopathy:     Cervical: No cervical adenopathy.  Skin:    General: Skin is warm and dry.     Findings: No erythema or rash.  Neurological:     Mental Status: She is alert and oriented to person, place, and time.     Cranial Nerves: No cranial nerve deficit.     Motor: No abnormal muscle tone.     Coordination: Coordination normal.     Deep Tendon Reflexes: Reflexes are normal and symmetric. Reflexes normal.  Psychiatric:        Behavior: Behavior normal.        Thought Content: Thought content normal.        Judgment: Judgment normal.     Depression Screen PHQ 2/9 Scores  12/01/2019 11/29/2018 11/26/2017 11/07/2016  PHQ - 2 Score 0 2 1 0  PHQ- 9 Score - 2 4 -      Assessment & Plan:     Routine Health Maintenance and Physical Exam  Exercise Activities and Dietary recommendations Goals    . DIET - INCREASE WATER INTAKE     Recommend increasing water intake to 4-6 glasses a day.        Immunization History  Administered Date(s) Administered  . Fluad Quad(high Dose 65+) 06/29/2019  . H1N1 10/12/2008  . Influenza, High Dose Seasonal PF 07/31/2014, 08/02/2015, 07/18/2016, 08/04/2017, 07/14/2018  . Pneumococcal Conjugate-13 11/17/2014  . Pneumococcal Polysaccharide-23 06/02/2011, 11/07/2016  . Tdap 10/21/2010  . Zoster 10/27/2011    Health Maintenance  Topic Date Due  . DEXA SCAN  01/05/2020  . TETANUS/TDAP  10/21/2020  . MAMMOGRAM  04/24/2021  . COLONOSCOPY  12/03/2026  . INFLUENZA VACCINE  Completed  . Hepatitis C Screening  Completed  . PNA vac Low Risk Adult  Completed     Discussed health benefits of physical activity, and encouraged her to engage in regular exercise appropriate for her age and condition.    --------------------------------------------------------------------  1. Hypercholesterolemia without hypertriglyceridemia Well controlled cholesterol by diet alone. On 11-23-18 total cholesterol was 172, triglycerides 81, HDL 79 and LDL 77. Has not had to take any statins and cardiologist follow up maintained. Recent annual wellness screening by Nurse Health Advisor reviewed. Immunizations up to date. Recommend she schedule for COVID vaccination. Check routine labs. - CBC with Differential/Platelet - Comprehensive metabolic panel - Lipid panel - TSH  2. Avitaminosis D Routine Vitamin-D 2000 mg qd had  helped to maintain blood level around 40 last year. Will recheck labs and follow up pending reports. - CBC with Differential/Platelet - Comprehensive metabolic panel - VITAMIN D 25 Hydroxy (Vit-D Deficiency, Fractures)  3.  Osteoporosis, unspecified osteoporosis type, unspecified pathological fracture presence Tolerating Fosamax 70 mg q week. Due for BMD test in the next 4-6 weeks. Continue present Fosamax dosage and vitamin D with calcium supplements. Recheck CMP and Vitamin D level. - Comprehensive metabolic panel - VITAMIN D 25 Hydroxy (Vit-D Deficiency, Fractures) - DG Bone Density; Future  4. Mitral valve prolapse No murmur or click of valves today. Followed by Dr. Percival Spanish (cardiologist) regularly. Will recheck routine labs. Denies peripheral edema, dyspnea or palpitations today. - CBC with Differential/Platelet - Comprehensive metabolic panel - Lipid panel - TSH  5. History of atrial fibrillation No significant palpitations or dyspnea. Presently on ASA 81 mg qd with Propranolol and Diltiazem. Followed by cardiologist every 12-18 months. Recheck routine labs. - CBC with Differential/Platelet - TSH   Vernie Murders, PA  [facility]

## 2019-12-02 ENCOUNTER — Telehealth: Payer: Self-pay

## 2019-12-02 LAB — COMPREHENSIVE METABOLIC PANEL
ALT: 17 IU/L (ref 0–32)
AST: 22 IU/L (ref 0–40)
Albumin/Globulin Ratio: 1.6 (ref 1.2–2.2)
Albumin: 4.7 g/dL (ref 3.8–4.8)
Alkaline Phosphatase: 91 IU/L (ref 39–117)
BUN/Creatinine Ratio: 19 (ref 12–28)
BUN: 16 mg/dL (ref 8–27)
Bilirubin Total: 0.3 mg/dL (ref 0.0–1.2)
CO2: 25 mmol/L (ref 20–29)
Calcium: 10.1 mg/dL (ref 8.7–10.3)
Chloride: 101 mmol/L (ref 96–106)
Creatinine, Ser: 0.84 mg/dL (ref 0.57–1.00)
GFR calc Af Amer: 81 mL/min/{1.73_m2} (ref >59)
GFR calc non Af Amer: 71 mL/min/{1.73_m2} (ref >59)
Globulin, Total: 2.9 g/dL (ref 1.5–4.5)
Glucose: 89 mg/dL (ref 65–99)
Potassium: 4.3 mmol/L (ref 3.5–5.2)
Sodium: 139 mmol/L (ref 134–144)
Total Protein: 7.6 g/dL (ref 6.0–8.5)

## 2019-12-02 LAB — CBC WITH DIFFERENTIAL/PLATELET
Basophils Absolute: 0 10*3/uL (ref 0.0–0.2)
Basos: 0 %
EOS (ABSOLUTE): 0.3 10*3/uL (ref 0.0–0.4)
Eos: 5 %
Hematocrit: 40.4 % (ref 34.0–46.6)
Hemoglobin: 13.1 g/dL (ref 11.1–15.9)
Immature Grans (Abs): 0 10*3/uL (ref 0.0–0.1)
Immature Granulocytes: 1 %
Lymphocytes Absolute: 1.4 10*3/uL (ref 0.7–3.1)
Lymphs: 21 %
MCH: 27.9 pg (ref 26.6–33.0)
MCHC: 32.4 g/dL (ref 31.5–35.7)
MCV: 86 fL (ref 79–97)
Monocytes Absolute: 0.4 10*3/uL (ref 0.1–0.9)
Monocytes: 6 %
Neutrophils Absolute: 4.4 10*3/uL (ref 1.4–7.0)
Neutrophils: 67 %
Platelets: 241 10*3/uL (ref 150–450)
RBC: 4.7 x10E6/uL (ref 3.77–5.28)
RDW: 12.7 % (ref 11.7–15.4)
WBC: 6.7 10*3/uL (ref 3.4–10.8)

## 2019-12-02 LAB — LIPID PANEL
Chol/HDL Ratio: 2.1 ratio (ref 0.0–4.4)
Cholesterol, Total: 161 mg/dL (ref 100–199)
HDL: 77 mg/dL (ref >39)
LDL Chol Calc (NIH): 68 mg/dL (ref 0–99)
Triglycerides: 89 mg/dL (ref 0–149)
VLDL Cholesterol Cal: 16 mg/dL (ref 5–40)

## 2019-12-02 LAB — TSH: TSH: 1.15 u[IU]/mL (ref 0.450–4.500)

## 2019-12-02 LAB — VITAMIN D 25 HYDROXY (VIT D DEFICIENCY, FRACTURES): Vit D, 25-Hydroxy: 39.2 ng/mL (ref 30.0–100.0)

## 2019-12-02 NOTE — Telephone Encounter (Signed)
Advised 

## 2019-12-02 NOTE — Telephone Encounter (Signed)
-----   Message from Margo [place], [place] sent at 12/02/2019  8:01 AM EDT ----- All blood tests normal. Recheck annually.

## 2019-12-05 ENCOUNTER — Telehealth: Payer: Self-pay

## 2019-12-05 NOTE — Telephone Encounter (Signed)
Copied from [place] [phone]. Topic: General - Other >> Dec 05, 2019  4:13 PM Celene Kras wrote: Reason for CRM: Pt called and is requesting to set up her bone density test. Pt is also requesting to know if she should continue to take her medication the way she was before or change it. Please advise.

## 2019-12-06 DIAGNOSIS — R69 Illness, unspecified: Secondary | ICD-10-CM | POA: Diagnosis not present

## 2019-12-06 NOTE — Telephone Encounter (Signed)
LMTCB

## 2019-12-07 ENCOUNTER — Telehealth: Payer: Self-pay

## 2019-12-07 NOTE — Telephone Encounter (Signed)
Patient was inquiring if her bone density study was ordered as Kristina Jordan said he would at the time of the last visit.  Also she was asking if she was to continue taking her Calcium and Vit D.   Patient was advised that the scan was ordered and to continue with her medication the way she has been taking it until we get results from the scan.

## 2019-12-07 NOTE — Telephone Encounter (Signed)
Copied from [place] [phone]. Topic: General - Inquiry >> Dec 06, 2019  7:08 PM Parke Poisson wrote: Reason for CRM:Pt wants to know if she needs to continue to take her calcium and vitamin D supplement

## 2019-12-30 ENCOUNTER — Telehealth: Payer: Self-pay

## 2019-12-30 NOTE — Telephone Encounter (Signed)
Tried returning call to pt, no answer and no vm. The only results in chart are from 12/01/2019. Okay for [facility] triage to give pt results.

## 2019-12-30 NOTE — Telephone Encounter (Signed)
Copied from [place] [phone]. Topic: General - Other >> Dec 30, 2019 11:33 AM Kristina Jordan wrote: Reason for CRM: Patient called to inquire of the result of her blood work. Asking for Jordan call back at Ph# [phone]

## 2020-01-02 ENCOUNTER — Telehealth: Payer: Self-pay | Admitting: Family Medicine

## 2020-01-02 NOTE — Telephone Encounter (Signed)
Patient called and was read lab note of Kristina Jordan written 12/02/19. She verbalized understanding. And then requested the numbers for her Vit D and Calcium. I reviewed each result and normal ranges. She verbalized understanding.

## 2020-01-04 DIAGNOSIS — H2513 Age-related nuclear cataract, bilateral: Secondary | ICD-10-CM | POA: Diagnosis not present

## 2020-01-09 ENCOUNTER — Ambulatory Visit
Admission: RE | Admit: 2020-01-09 | Discharge: 2020-01-09 | Disposition: A | Discharge status: Home / Self Care | Payer: Medicare HMO | Source: Ambulatory Visit | Attending: Family Medicine | Admitting: Family Medicine

## 2020-01-09 DIAGNOSIS — M8589 Other specified disorders of bone density and structure, multiple sites: Secondary | ICD-10-CM | POA: Diagnosis not present

## 2020-01-09 DIAGNOSIS — M810 Age-related osteoporosis without current pathological fracture: Secondary | ICD-10-CM | POA: Diagnosis not present

## 2020-01-09 DIAGNOSIS — Z780 Asymptomatic menopausal state: Secondary | ICD-10-CM | POA: Diagnosis not present

## 2020-01-10 ENCOUNTER — Telehealth: Payer: Self-pay

## 2020-01-10 NOTE — Telephone Encounter (Signed)
-----   Message from [place], [place] sent at 01/09/2020  1:21 PM EDT ----- Still has thin bones in the lumbar spine and need to continue the Fosamax, Calcium supplement and vitamin-D. Recheck BMD test in 2 years.

## 2020-01-10 NOTE — Telephone Encounter (Signed)
Advised 

## 2020-04-09 DIAGNOSIS — R69 Illness, unspecified: Secondary | ICD-10-CM | POA: Diagnosis not present

## 2020-04-10 DIAGNOSIS — R69 Illness, unspecified: Secondary | ICD-10-CM | POA: Diagnosis not present

## 2020-05-28 DIAGNOSIS — R69 Illness, unspecified: Secondary | ICD-10-CM | POA: Diagnosis not present

## 2020-06-14 ENCOUNTER — Other Ambulatory Visit: Payer: Self-pay | Admitting: Family Medicine

## 2020-07-16 ENCOUNTER — Other Ambulatory Visit: Payer: Self-pay | Admitting: Family Medicine

## 2020-07-16 NOTE — Telephone Encounter (Signed)
Call to patient- she has been scheduled for follow up- she states she has medication to get to appointment

## 2020-07-20 ENCOUNTER — Other Ambulatory Visit: Payer: Self-pay

## 2020-07-20 ENCOUNTER — Ambulatory Visit (INDEPENDENT_AMBULATORY_CARE_PROVIDER_SITE_OTHER): Payer: Medicare HMO | Admitting: Family Medicine

## 2020-07-20 ENCOUNTER — Encounter: Payer: Self-pay | Admitting: Family Medicine

## 2020-07-20 VITALS — BP 115/71 | HR 65 | Temp 97.7°F | Resp 16 | Wt 125.8 lb

## 2020-07-20 DIAGNOSIS — I482 Chronic atrial fibrillation, unspecified: Secondary | ICD-10-CM | POA: Diagnosis not present

## 2020-07-20 DIAGNOSIS — E7800 Pure hypercholesterolemia, unspecified: Secondary | ICD-10-CM | POA: Diagnosis not present

## 2020-07-20 DIAGNOSIS — M810 Age-related osteoporosis without current pathological fracture: Secondary | ICD-10-CM | POA: Diagnosis not present

## 2020-07-20 DIAGNOSIS — Z23 Encounter for immunization: Secondary | ICD-10-CM | POA: Diagnosis not present

## 2020-07-20 MED ORDER — PROPRANOLOL HCL 40 MG PO TABS: 40.0000 mg | ORAL_TABLET | Freq: Two times a day (BID) | ORAL | 3 refills | Status: DC

## 2020-07-20 MED ORDER — DILTIAZEM HCL 120 MG PO TABS: 120.0000 mg | ORAL_TABLET | Freq: Every day | ORAL | 3 refills | Status: DC

## 2020-07-20 NOTE — Progress Notes (Signed)
Established patient visit   Patient: Kristina Jordan   DOB: 1951-01-29   69 y.o. Female  MRN: [id number] Visit Date: 07/20/2020  Today's healthcare provider: Vernie Murders, PA   Chief Complaint  Patient presents with  . Heart Problem   Subjective    HPI  Follow up for Heart Disease/ Mitral Valve prolapse:  The patient was last seen for this 7 months ago. Changes made at last visit include continue same medication.  She reports good compliance with treatment. She feels that condition is Unchanged. She is not having side effects.   -----------------------------------------------------------------------------------------   Past Medical History:  Diagnosis Date  . Hyperlipidemia   . MITRAL INSUFFICIENCY   . MITRAL VALVE PROLAPSE    Past Surgical History:  Procedure Laterality Date  . ABDOMINAL HYSTERECTOMY    . BREAST BIOPSY Left    stereo- neg  . BREAST EXCISIONAL BIOPSY Left    neg  . CERVICAL SPINE SURGERY    . COLONOSCOPY WITH PROPOFOL N/A 12/02/2016   Procedure: COLONOSCOPY WITH PROPOFOL;  Surgeon: [name], MD;  Location: [facility] ENDOSCOPY;  Service: Endoscopy;  Laterality: N/A;  . LEFT HEART CATHETERIZATION WITH CORONARY ANGIOGRAM N/A 09/10/2011   Procedure: LEFT HEART CATHETERIZATION WITH CORONARY ANGIOGRAM;  Surgeon: Wellington Hampshire, MD;  Location: [place] [facility];  Service: Cardiovascular;  Laterality: N/A;   Social History   Tobacco Use  . Smoking status: Former Smoker    Types: Cigarettes  . Smokeless tobacco: Never Used  . Tobacco comment: only socially  Vaping Use  . Vaping Use: Never used  Substance Use Topics  . Alcohol use: No  . Drug use: No   Family History  Problem Relation Age of Onset  . Multiple sclerosis Sister   . Cancer Brother        bladder  . Heart attack Mother   . Cancer Sister        bladder  . Coronary artery disease Other   . Thyroid disease Other   . Breast cancer Neg Hx    Allergies  Allergen Reactions  .  Sulfonamide Derivatives Rash       Medications: Outpatient Medications Prior to Visit  Medication Sig  . alendronate (FOSAMAX) 70 MG tablet TAKE 1 TABLET BY MOUTH ONCE A WEEK WITH  A  FULL  GLASS  OF  WATER  ON  AN  EMPTY  STOMACH.  DO  NOT  LIE  DOWN  UNTIL  AFTER  BREAKFAST  . aspirin 81 MG tablet Take 81 mg by mouth daily.  Marland Kitchen CALCIUM CITRATE PO Take 600 mg by mouth 2 (two) times daily.  . Cholecalciferol (VITAMIN D-3) 1000 UNITS CAPS Take 4,000 Units by mouth daily.   Marland Kitchen diltiazem (CARDIZEM) 120 MG tablet Take 1 tablet by mouth once daily  . propranolol (INDERAL) 40 MG tablet Take 1 tablet by mouth twice daily   No facility-administered medications prior to visit.    Review of Systems  Constitutional: Negative for appetite change, chills, fatigue and fever.  Respiratory: Negative for chest tightness and shortness of breath.   Cardiovascular: Negative for chest pain and palpitations.  Gastrointestinal: Negative for abdominal pain, nausea and vomiting.  Neurological: Negative for dizziness and weakness.    Last CBC Lab Results  Component Value Date   WBC 6.7 12/01/2019   HGB 13.1 12/01/2019   HCT 40.4 12/01/2019   MCV 86 12/01/2019   MCH 27.9 12/01/2019   RDW 12.7 12/01/2019   PLT  241 34/74/2595   Last metabolic panel Lab Results  Component Value Date   GLUCOSE 89 12/01/2019   NA 139 12/01/2019   K 4.3 12/01/2019   CL 101 12/01/2019   CO2 25 12/01/2019   BUN 16 12/01/2019   CREATININE 0.84 12/01/2019   GFRNONAA 71 12/01/2019   GFRAA 81 12/01/2019   CALCIUM 10.1 12/01/2019   PROT 7.6 12/01/2019   ALBUMIN 4.7 12/01/2019   LABGLOB 2.9 12/01/2019   AGRATIO 1.6 12/01/2019   BILITOT 0.3 12/01/2019   ALKPHOS 91 12/01/2019   AST 22 12/01/2019   ALT 17 12/01/2019   Last lipids Lab Results  Component Value Date   CHOL 161 12/01/2019   HDL 77 12/01/2019   LDLCALC 68 12/01/2019   TRIG 89 12/01/2019   CHOLHDL 2.1 12/01/2019   Last thyroid functions Lab Results    Component Value Date   TSH 1.150 12/01/2019      Objective    BP 115/71 (BP Location: Left Arm, Patient Position: Sitting, Cuff Size: Normal)   Pulse 65   Temp 97.7 F (36.5 C) (Oral)   Resp 16   Wt 125 lb 12.8 oz (57.1 kg)   BMI 20.93 kg/m  BP Readings from Last 3 Encounters:  07/20/20 115/71  12/01/19 107/65  04/06/19 (!) 99/57   Wt Readings from Last 3 Encounters:  07/20/20 125 lb 12.8 oz (57.1 kg)  12/01/19 131 lb 6.4 oz (59.6 kg)  04/06/19 128 lb 9.6 oz (58.3 kg)     Physical Exam Constitutional:      General: She is not in acute distress.    Appearance: She is well-developed.  HENT:     Head: Normocephalic and atraumatic.     Right Ear: Hearing normal.     Left Ear: Hearing normal.     Nose: Nose normal.  Eyes:     General: Lids are normal. No scleral icterus.       Right eye: No discharge.        Left eye: No discharge.     Conjunctiva/sclera: Conjunctivae normal.  Cardiovascular:     Rate and Rhythm: Regular rhythm.     Pulses: Normal pulses.     Heart sounds: Normal heart sounds.  Pulmonary:     Effort: Pulmonary effort is normal. No respiratory distress.     Breath sounds: Normal breath sounds.  Abdominal:     General: Bowel sounds are normal.     Palpations: Abdomen is soft.  Musculoskeletal:        General: Normal range of motion.     Cervical back: Normal range of motion and neck supple.  Skin:    Findings: No lesion or rash.  Neurological:     Mental Status: She is alert and oriented to person, place, and time.  Psychiatric:        Speech: Speech normal.        Behavior: Behavior normal.        Thought Content: Thought content normal.       No results found for any visits on 07/20/20.  Assessment & Plan     1. Chronic atrial fibrillation (HCC) Followed by Dr. Percival Spanish (cardiologist) every 18 months. No chest pains, dyspnea or palpitations. Refill Cardizem and Inderal to maintain control. Recheck for annual physical in March 2022. -  diltiazem (CARDIZEM) 120 MG tablet; Take 1 tablet (120 mg total) by mouth daily.  Dispense: 90 tablet; Refill: 3 - propranolol (INDERAL) 40 MG tablet; Take 1 tablet (  40 mg total) by mouth 2 (two) times daily.  Dispense: 180 tablet; Refill: 3  2. Hypercholesterolemia without hypertriglyceridemia Well controlled with low fat diet and exercise.   3. Osteoporosis without current pathological fracture, unspecified osteoporosis type Tolerating the Foxamax once a week with Vitamin D 4000 mg qd and Calcium 600 mg BID. Will continue present medications and recheck annually.  4. Need for influenza vaccination - Flu Vaccine QUAD High Dose IM (Fluad)   No follow-ups on file.      Andres Shad, PA, have reviewed all documentation for this visit. The documentation on 07/20/20 for the exam, diagnosis, procedures, and orders are all accurate and complete.    Vernie Murders, [place] [phone] (phone) [phone] (fax)  [place]

## 2020-07-25 ENCOUNTER — Ambulatory Visit: Payer: Medicare HMO

## 2020-07-31 ENCOUNTER — Emergency Department: Payer: Medicare HMO

## 2020-07-31 ENCOUNTER — Emergency Department
Admission: EM | Admit: 2020-07-31 | Discharge: 2020-07-31 | Disposition: A | Discharge status: Home / Self Care | Payer: Medicare HMO | Attending: Emergency Medicine | Admitting: Emergency Medicine

## 2020-07-31 ENCOUNTER — Other Ambulatory Visit: Payer: Self-pay

## 2020-07-31 ENCOUNTER — Encounter: Payer: Self-pay | Admitting: Emergency Medicine

## 2020-07-31 ENCOUNTER — Ambulatory Visit: Payer: Self-pay | Admitting: Family Medicine

## 2020-07-31 DIAGNOSIS — Z87891 Personal history of nicotine dependence: Secondary | ICD-10-CM | POA: Insufficient documentation

## 2020-07-31 DIAGNOSIS — I700 Atherosclerosis of aorta: Secondary | ICD-10-CM | POA: Diagnosis not present

## 2020-07-31 DIAGNOSIS — Z7982 Long term (current) use of aspirin: Secondary | ICD-10-CM | POA: Insufficient documentation

## 2020-07-31 DIAGNOSIS — R079 Chest pain, unspecified: Secondary | ICD-10-CM | POA: Diagnosis not present

## 2020-07-31 DIAGNOSIS — Z79899 Other long term (current) drug therapy: Secondary | ICD-10-CM | POA: Insufficient documentation

## 2020-07-31 DIAGNOSIS — R0789 Other chest pain: Secondary | ICD-10-CM | POA: Insufficient documentation

## 2020-07-31 LAB — CBC
HCT: 40.3 % (ref 36.0–46.0)
Hemoglobin: 12.7 g/dL (ref 12.0–15.0)
MCH: 27.3 pg (ref 26.0–34.0)
MCHC: 31.5 g/dL (ref 30.0–36.0)
MCV: 86.7 fL (ref 80.0–100.0)
Platelets: 205 10*3/uL (ref 150–400)
RBC: 4.65 MIL/uL (ref 3.87–5.11)
RDW: 13.5 % (ref 11.5–15.5)
WBC: 8 10*3/uL (ref 4.0–10.5)
nRBC: 0 % (ref 0.0–0.2)

## 2020-07-31 LAB — BASIC METABOLIC PANEL
Anion gap: 8 (ref 5–15)
BUN: 18 mg/dL (ref 8–23)
CO2: 27 mmol/L (ref 22–32)
Calcium: 9.7 mg/dL (ref 8.9–10.3)
Chloride: 104 mmol/L (ref 98–111)
Creatinine, Ser: 0.91 mg/dL (ref 0.44–1.00)
GFR, Estimated: >60 mL/min (ref >60)
Glucose, Bld: 110 mg/dL — ABNORMAL HIGH (ref 70–99)
Potassium: 3.9 mmol/L (ref 3.5–5.1)
Sodium: 139 mmol/L (ref 135–145)

## 2020-07-31 LAB — TROPONIN I (HIGH SENSITIVITY)
Troponin I (High Sensitivity): 4 ng/L (ref <18)
Troponin I (High Sensitivity): 4 ng/L (ref <18)

## 2020-07-31 MED ORDER — IOHEXOL 350 MG/ML SOLN
100.0000 mL | Freq: Once | INTRAVENOUS | Status: AC | PRN
  Administered 2020-07-31: 100 mL via INTRAVENOUS

## 2020-07-31 MED ORDER — CYCLOBENZAPRINE HCL 5 MG PO TABS: 5.0000 mg | ORAL_TABLET | Freq: Three times a day (TID) | ORAL | 0 refills | Status: AC | PRN

## 2020-07-31 NOTE — ED Triage Notes (Signed)
Pt arrived to ed via pov. C/o CP starting "few days ago" but increased in intensity around midnight last night. Pain increases when breathing deep. NAD denies any n/v, lightheadedness, or other symptoms.

## 2020-07-31 NOTE — Telephone Encounter (Signed)
FYI

## 2020-07-31 NOTE — Telephone Encounter (Signed)
Phone call from pt. With c/o chest pain.  Stated "it started yesterday or the day before."  "The pain is in the upper chest, and sometimes it goes across the chest."  Pain is off and on and rated at 10/10.  Stated it radiates to the back, and sometimes she can just feel it in the back.  Reported it worsens with deep breath.  Denied dizziness, sweating, SOB, or nausea.  Hx of MI in 2012.  Pt. Advised to go to the ER at this time.  Care advice per protocol.  Verb. Understanding.     Reason for Disposition  [1] Chest pain (or "angina") comes and goes AND [2] is happening more often (increasing in frequency) or getting worse (increasing in severity) (Exception: chest pains that last only a few seconds)  Answer Assessment - Initial Assessment Questions 1. LOCATION: "Where does it hurt?"       Upper chest area; sometimes all the way across 2. RADIATION: "Does the pain go anywhere else?" (e.g., into neck, jaw, arms, back)     To the back ; "sometimes its just in the back"  3. ONSET: "When did the chest pain begin?" (Minutes, hours or days)      Yesterday or the day before 4. PATTERN "Does the pain come and go, or has it been constant since it started?"  "Does it get worse with exertion?"      It's off and on 5. DURATION: "How long does it last" (e.g., seconds, minutes, hours)     "Lasts for a little bit and sometimes it lingers" 6. SEVERITY: "How bad is the pain?"  (e.g., Scale 1-10; mild, moderate, or severe)    - MILD (1-3): doesn't interfere with normal activities     - MODERATE (4-7): interferes with normal activities or awakens from sleep    - SEVERE (8-10): excruciating pain, unable to do any normal activities      10/10 at times  7. CARDIAC RISK FACTORS: "Do you have any history of heart problems or risk factors for heart disease?" (e.g., angina, prior heart attack; diabetes, high blood pressure, high cholesterol, smoker, or strong family history of heart disease)     Hx of MI 2012 8.  PULMONARY RISK FACTORS: "Do you have any history of lung disease?"  (e.g., blood clots in lung, asthma, emphysema, birth control pills)     No  9. CAUSE: "What do you think is causing the chest pain?"     unknown 10. OTHER SYMPTOMS: "Do you have any other symptoms?" (e.g., dizziness, nausea, vomiting, sweating, fever, difficulty breathing, cough)       Denied dizziness, SOB, nausea, sweating.  It hurts to take a deep breath 11. PREGNANCY: "Is there any chance you are pregnant?" "When was your last menstrual period?"       N/a  Protocols used: CHEST PAIN-A-AH

## 2020-07-31 NOTE — ED Provider Notes (Signed)
[facility] Emergency Department Provider Note ____________________________________________   First MD Initiated Contact with Patient 07/31/20 [phone]     (approximate)  I have reviewed the triage vital signs and the nursing notes.   HISTORY  Chief Complaint Chest Pain    HPI Kristina Jordan is a 69 y.o. female with PMH as noted below including a history of atrial fibrillation and remote history of MI who presents with chest pain over the last 2 days, worse since last night but now resolved, described as a sharp pain, mainly substernal, but somewhat radiating to the back. She denies associated shortness of breath, lightheadedness, nausea or vomiting, or other acute symptoms. She has no leg swelling or pain. The patient reports that she was doing some vigorous work around the house earlier yesterday and the pain got worse after this.  Past Medical History:  Diagnosis Date  . Hyperlipidemia   . MITRAL INSUFFICIENCY   . MITRAL VALVE PROLAPSE     Patient Active Problem List   Diagnosis Date Noted  . Ganglion cyst of flexor tendon sheath of finger 12/30/2016  . Heme + stool   . Hypercholesterolemia without hypertriglyceridemia 11/19/2015  . Avitaminosis D 11/19/2015  . Atypical chest pain 09/09/2011  . Non-ST elevation MI (NSTEMI) ([place]) 09/09/2011  . 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 MITRAL INSUFFICIENCY 12/26/2009  . Mitral valve disorder 12/24/2009  . FIBRILLATION, ATRIAL 12/24/2009  . Heart disease 11/01/2009  . 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 Acquired trigger finger 01/02/2009  . Need for prophylactic hormone replacement therapy (postmenopausal) 10/09/2006  . OP (osteoporosis) 10/09/2006    Past Surgical History:  Procedure Laterality Date  . ABDOMINAL HYSTERECTOMY    . BREAST BIOPSY Left    stereo- neg  . BREAST EXCISIONAL BIOPSY Left    neg  . CERVICAL SPINE SURGERY    . COLONOSCOPY WITH PROPOFOL N/A 12/02/2016   Procedure: COLONOSCOPY WITH  PROPOFOL;  Surgeon: [name], MD;  Location: [facility] ENDOSCOPY;  Service: Endoscopy;  Laterality: N/A;  . LEFT HEART CATHETERIZATION WITH CORONARY ANGIOGRAM N/A 09/10/2011   Procedure: LEFT HEART CATHETERIZATION WITH CORONARY ANGIOGRAM;  Surgeon: Wellington Hampshire, MD;  Location: [facility] CATH LAB;  Service: Cardiovascular;  Laterality: N/A;    Prior to Admission medications   Medication Sig Start Date End Date Taking? Authorizing Provider  alendronate (FOSAMAX) 70 MG tablet TAKE 1 TABLET BY MOUTH ONCE A WEEK WITH  A  FULL  GLASS  OF  WATER  ON  AN  EMPTY  STOMACH.  DO  NOT  LIE  DOWN  UNTIL  AFTER  BREAKFAST 06/14/20   Chrismon, Vickki Muff, PA  aspirin 81 MG tablet Take 81 mg by mouth daily.    [provider]  CALCIUM CITRATE PO Take 600 mg by mouth 2 (two) times daily.    [provider]  Cholecalciferol (VITAMIN D-3) 1000 UNITS CAPS Take 4,000 Units by mouth daily.     [provider]  cyclobenzaprine (FLEXERIL) 5 MG tablet Take 1 tablet (5 mg total) by mouth 3 (three) times daily as needed for up to 5 days for muscle spasms. 07/31/20 08/05/20  Arta Silence, MD  diltiazem (CARDIZEM) 120 MG tablet Take 1 tablet (120 mg total) by mouth daily. 07/20/20   Chrismon, Vickki Muff, PA  propranolol (INDERAL) 40 MG tablet Take 1 tablet (40 mg total) by mouth 2 (two) times daily. 07/20/20   Chrismon, Vickki Muff, PA    Allergies Sulfonamide derivatives  Family History  Problem Relation Age of Onset  . Multiple sclerosis Sister   . Cancer Brother        bladder  . Heart attack Mother   . Cancer Sister        bladder  . Coronary artery disease Other   . Thyroid disease Other   . Breast cancer Neg Hx     Social History Social History   Tobacco Use  . Smoking status: Former Smoker    Types: Cigarettes  . Smokeless tobacco: Never Used  . Tobacco comment: only socially  Vaping Use  . Vaping Use: Never used  Substance Use Topics  . Alcohol use: No  . Drug use: No     Review of Systems  Constitutional: No fever. Eyes: No redness. ENT: No sore throat. Cardiovascular: Positive for chest pain. Respiratory: Denies shortness of breath. Gastrointestinal: No vomiting or diarrhea.  Genitourinary: Negative for flank pain.  Musculoskeletal: Negative for back pain. Skin: Negative for rash. Neurological: Negative for headache.   ____________________________________________   PHYSICAL EXAM:  VITAL SIGNS: ED Triage Vitals  Enc Vitals Group     BP 07/31/20 0940 136/67     Pulse Rate 07/31/20 0940 82     Resp 07/31/20 0940 18     Temp --      Temp src --      SpO2 07/31/20 0940 99 %     Weight 07/31/20 0943 124 lb (56.2 kg)     Height 07/31/20 0943 5\' 4"  (1.626 m)     Head Circumference --      Peak Flow --      Pain Score 07/31/20 0940 2     Pain Loc --      Pain Edu? --      Excl. in [place]? --     Constitutional: Alert and oriented. Well appearing and in no acute distress. Eyes: Conjunctivae are normal.  Head: Atraumatic. Nose: No congestion/rhinnorhea. Mouth/Throat: Mucous membranes are moist.   Neck: Normal range of motion.  Cardiovascular: Normal rate, regular rhythm. Grossly normal heart sounds.  Good peripheral circulation. Respiratory: Normal respiratory effort.  No retractions. Lungs CTAB. Gastrointestinal: No distention.  Musculoskeletal: No lower extremity edema. No calf or popliteal swelling or tenderness. Extremities warm and well perfused.  Neurologic:  Normal speech and language. No gross focal neurologic deficits are appreciated.  Skin:  Skin is warm and dry. No rash noted. Psychiatric: Mood and affect are normal. Speech and behavior are normal.  ____________________________________________   LABS (all labs ordered are listed, but only abnormal results are displayed)  Labs Reviewed  BASIC METABOLIC PANEL - Abnormal; Notable for the following components:      Result Value   Glucose, Bld 110 (*)    All other components  within normal limits  CBC  TROPONIN I (HIGH SENSITIVITY)  TROPONIN I (HIGH SENSITIVITY)   ____________________________________________  EKG  ED ECG REPORT I, Arta Silence, the attending physician, personally viewed and interpreted this ECG.  Date: 07/31/2020 EKG Time: 0936 Rate: 87 Rhythm: normal sinus rhythm QRS Axis: normal Intervals: normal ST/T Wave abnormalities: normal Narrative Interpretation: no evidence of acute ischemia  ____________________________________________  RADIOLOGY  CXR interpreted by me shows no focal opacity or other acute abnormality CT angio chest/abdomen/pelvis: No aortic dissection or other acute abnormality  ____________________________________________   PROCEDURES  Procedure(s) performed: No  Procedures  Critical Care performed: No ____________________________________________   INITIAL IMPRESSION / ASSESSMENT AND PLAN / ED COURSE  Pertinent labs & imaging results  that were available during my care of the patient were reviewed by me and considered in my medical decision making (see chart for details).  69 year old female with a history of atrial fibrillation and remote history of MI presents with somewhat atypical chest pain over the last 2 days, which seems to have worsened after she was doing vigorous work around the house. There are no significant associated symptoms, and the pain has almost completely resolved this morning.  I reviewed the past medical records in [place]. The patient has no recent prior ED visits or admissions. She is on Cardizem and propranolol for the atrial fibrillation and takes aspirin, but is not on anticoagulation.  On exam currently, the patient is well-appearing. Her vital signs are normal. The physical exam is unremarkable. EKG shows sinus rhythm with no ischemic changes.  Overall presentation is most consistent with musculoskeletal pain or other benign etiology. Given the patient somewhat increased risk  we will obtain troponins x2. Also with pain radiating to the back, differential also includes aortic dissection although my clinical suspicion is fairly low. We will obtain CT angio chest to rule this out.  ----------------------------------------- 3:36 PM on 07/31/2020 -----------------------------------------  Troponins x2 were negative.  Other lab work-up was unremarkable.  CT showed no aortic dissection or other acute findings.  The patient has some pulmonary nodules which will require follow-up.  I informed her about this and provided the information in her discharge paperwork.  The patient has had no recurrent pain in the ED.  There is no evidence of a cardiac etiology of her pain; it is consistent with musculoskeletal pain.  The patient is stable for discharge home.  I counseled her on the results of the work-up.  She will follow up with her cardiologist.  Return precautions given, and she expresses understanding.  ____________________________________________   FINAL CLINICAL IMPRESSION(S) / ED DIAGNOSES  Final diagnoses:  Atypical chest pain      NEW MEDICATIONS STARTED DURING THIS VISIT:  Discharge Medication List as of 07/31/2020  1:56 PM       Note:  This document was prepared using Dragon voice recognition software and may include unintentional dictation errors.    Arta Silence, MD 07/31/20 1537

## 2020-07-31 NOTE — Discharge Instructions (Addendum)
Return to the ER for new, worsening, or persistent severe chest pain, back pain, difficulty breathing, weakness or lightheadedness, or any other new or worsening symptoms that concern you.  Your CT scan shows some nodules in your lungs.  You will need a repeat CT scan in 6-12 months.  You should let your primary doctor know about this so it can be ordered for you.   Scarring in the lungs, particularly at the lung apices.  Small scattered nodular densities throughout both lungs are  indeterminate and could be postinflammatory in etiology. These  findings are indeterminate. Largest density is a 7 mm ground-glass  opacity in the right lower lobe.

## 2020-08-06 ENCOUNTER — Other Ambulatory Visit: Payer: Self-pay

## 2020-08-06 ENCOUNTER — Encounter: Payer: Self-pay | Admitting: Family Medicine

## 2020-08-06 ENCOUNTER — Ambulatory Visit (INDEPENDENT_AMBULATORY_CARE_PROVIDER_SITE_OTHER): Payer: Medicare HMO | Admitting: Family Medicine

## 2020-08-06 VITALS — BP 106/49 | HR 59 | Temp 98.4°F | Wt 126.0 lb

## 2020-08-06 DIAGNOSIS — Z8679 Personal history of other diseases of the circulatory system: Secondary | ICD-10-CM | POA: Diagnosis not present

## 2020-08-06 DIAGNOSIS — I341 Nonrheumatic mitral (valve) prolapse: Secondary | ICD-10-CM | POA: Diagnosis not present

## 2020-08-06 DIAGNOSIS — R918 Other nonspecific abnormal finding of lung field: Secondary | ICD-10-CM

## 2020-08-06 DIAGNOSIS — R0789 Other chest pain: Secondary | ICD-10-CM

## 2020-08-06 NOTE — Progress Notes (Signed)
Established patient visit   Patient: Kristina Jordan   DOB: 02-23-1951   69 y.o. Female  MRN: [id number] Visit Date: 08/06/2020  Today's healthcare provider: Vernie Murders, PA   No chief complaint on file.  Subjective    HPI   The patient is a 69 year old female who presents today for follow up after being in the ER for chest pain.   She had EKG revealing she was in sinus rhythm.  She had negative Troponin levels x 2.  She had CXR and Chest CT.   Her CXR showed no acute cardiopulmonary abnormalities and her CT revealed the following; 1. Negative for an aortic dissection or intramural hematoma. 2. Minimal atherosclerotic disease in the abdominal aorta without stenosis. Aortic Atherosclerosis (ICD10-I70.0). 3. An incidental finding of potential clinical significance has been found. Scarring in the lungs, particularly at the lung apices. Small scattered nodular densities throughout both lungs are indeterminate and could be postinflammatory in etiology. These findings are indeterminate. Largest density is a 7 mm ground-glass opacity in the right lower lobe. Initial follow-up with CT at 6-12 months is recommended to confirm persistence. If persistent, repeat CT is recommended every 2 years until 5 years of stability has been established. This recommendation follows the consensus statement: Guidelines for Management of Incidental Pulmonary Nodules Detected on CT Images: From the Fleischner Society 2017; Radiology 2017; 284:228-243. 4. No acute abnormality in the chest, abdomen or pelvis. 5. Dilatation of the distal left ureter of uncertain etiology. 6. Mild narrowing of the proximal celiac trunk is probably related to median arcuate ligament compression. No significant mesenteric artery stenosis.  Patient reports she is doing much better.  Past Medical History:  Diagnosis Date   Hyperlipidemia    MITRAL INSUFFICIENCY    MITRAL VALVE PROLAPSE    Past Surgical History:   Procedure Laterality Date   ABDOMINAL HYSTERECTOMY     BREAST BIOPSY Left    stereo- neg   BREAST EXCISIONAL BIOPSY Left    neg   CERVICAL SPINE SURGERY     COLONOSCOPY WITH PROPOFOL N/A 12/02/2016   Procedure: COLONOSCOPY WITH PROPOFOL;  Surgeon: [name], MD;  Location: [facility] ENDOSCOPY;  Service: Endoscopy;  Laterality: N/A;   LEFT HEART CATHETERIZATION WITH CORONARY ANGIOGRAM N/A 09/10/2011   Procedure: LEFT HEART CATHETERIZATION WITH CORONARY ANGIOGRAM;  Surgeon: Wellington Hampshire, MD;  Location: [place] CATH LAB;  Service: Cardiovascular;  Laterality: N/A;   Social History   Tobacco Use   Smoking status: Former Smoker    Types: Cigarettes   Smokeless tobacco: Never Used   Tobacco comment: only socially  Scientific laboratory technician Use: Never used  Substance Use Topics   Alcohol use: No   Drug use: No   Family History  Problem Relation Age of Onset   Multiple sclerosis Sister    Cancer Brother        bladder   Heart attack Mother    Cancer Sister        bladder   Coronary artery disease Other    Thyroid disease Other    Breast cancer Neg Hx    Allergies  Allergen Reactions   Sulfonamide Derivatives Rash   Medications: Outpatient Medications Prior to Visit  Medication Sig   alendronate (FOSAMAX) 70 MG tablet TAKE 1 TABLET BY MOUTH ONCE A WEEK WITH  A  FULL  GLASS  OF  WATER  ON  AN  EMPTY  STOMACH.  DO  NOT  LIE  DOWN  UNTIL  AFTER  BREAKFAST   aspirin 81 MG tablet Take 81 mg by mouth daily.   CALCIUM CITRATE PO Take 600 mg by mouth 2 (two) times daily.   Cholecalciferol (VITAMIN D-3) 1000 UNITS CAPS Take 4,000 Units by mouth daily.    diltiazem (CARDIZEM) 120 MG tablet Take 1 tablet (120 mg total) by mouth daily.   propranolol (INDERAL) 40 MG tablet Take 1 tablet (40 mg total) by mouth 2 (two) times daily.   No facility-administered medications prior to visit.    Review of Systems  Respiratory: Negative for shortness of breath and wheezing.   Cardiovascular: Negative  for chest pain, palpitations and leg swelling.  Neurological: Negative for dizziness and headaches.      Objective    BP (!) 106/49 (BP Location: Right Arm, Patient Position: Sitting, Cuff Size: Normal)   Pulse (!) 59   Temp 98.4 F (36.9 C) (Oral)   Wt 126 lb (57.2 kg)   SpO2 98%   BMI 21.63 kg/m  BP Readings from Last 3 Encounters:  08/06/20 (!) 106/49  07/31/20 (!) 117/59  07/20/20 115/71   Wt Readings from Last 3 Encounters:  08/06/20 126 lb (57.2 kg)  07/31/20 124 lb (56.2 kg)  07/20/20 125 lb 12.8 oz (57.1 kg)    Physical Exam Constitutional:      General: She is not in acute distress.    Appearance: She is well-developed.  HENT:     Head: Normocephalic and atraumatic.     Right Ear: Hearing and tympanic membrane normal.     Left Ear: Hearing and tympanic membrane normal.     Nose: Nose normal.     Mouth/Throat:     Mouth: Mucous membranes are dry.     Pharynx: Oropharynx is clear.  Eyes:     General: Lids are normal. No scleral icterus.       Right eye: No discharge.        Left eye: No discharge.     Conjunctiva/sclera: Conjunctivae normal.  Cardiovascular:     Rate and Rhythm: Normal rate and regular rhythm.     Pulses: Normal pulses.     Heart sounds: Normal heart sounds.  Pulmonary:     Effort: Pulmonary effort is normal. No respiratory distress.     Breath sounds: Normal breath sounds.  Abdominal:     General: Bowel sounds are normal.     Palpations: Abdomen is soft.  Musculoskeletal:        General: Normal range of motion.     Cervical back: Normal range of motion and neck supple.  Skin:    Findings: No lesion or rash.  Neurological:     Mental Status: She is alert and oriented to person, place, and time.  Psychiatric:        Speech: Speech normal.        Behavior: Behavior normal.        Thought Content: Thought content normal.     No results found for any visits on 08/06/20.  Assessment & Plan     1. Atypical chest pain Evaluated  at the ER on 07-31-20 without acute cardiovascular disease being found. Troponins were negative twice and no PE or dissecting aneurysm on CT. Some pulmonary nodules identified and will need pulmonology referral. Chest discomfort resolved. Suspected chest wall pain syndrome.  2. Mitral valve prolapse Followed by Dr. Percival Spanish (cardiologist) every 18 months. No dyspnea or palpitations.   3. History of atrial fibrillation  Followed by cardiologist above and on Cardizem 120 mg qd with Inderal 40 mg BID. Stable without palpitations or dyspnea.  4. Pulmonary nodules Multiple nodules on CT scan. Schedule pulmonology referral. - Ambulatory referral to Pulmonology   No follow-ups on file.      Andres Shad, PA, have reviewed all documentation for this visit. The documentation on 08/06/20 for the exam, diagnosis, procedures, and orders are all accurate and complete.    Vernie Murders, [place] [phone] (phone) [phone] (fax)  [place]

## 2020-09-03 ENCOUNTER — Telehealth: Payer: Self-pay | Admitting: Family Medicine

## 2020-09-03 ENCOUNTER — Other Ambulatory Visit: Payer: Self-pay | Admitting: Family Medicine

## 2020-09-03 MED ORDER — ALENDRONATE SODIUM 70 MG PO TABS: ORAL_TABLET | ORAL | 3 refills | Status: DC

## 2020-09-03 NOTE — Telephone Encounter (Signed)
[facility] faxed refill request for the following medications:  alendronate (FOSAMAX) 70 MG tablet  Last Rx: 06/14/2020 LOV: 08/06/2020 Please advise. Thanks TNP

## 2020-09-12 ENCOUNTER — Telehealth: Payer: Self-pay

## 2020-09-12 NOTE — Telephone Encounter (Signed)
Copied from CRM [id number]. Topic: General - Other >> Sep 12, 2020  2:08 PM Gaetana Michaelis A wrote: Reason for CRM: Damali called to inquire about Antibody infusion treatments for COVID in the event that she may potentially test positive.  Cortasia is not vaccinated and has not been tested. Zynia can be called back at [phone]

## 2020-09-13 NOTE — Telephone Encounter (Signed)
If test positive and having significant symptoms, you would be put in to the pool of patients screened for infusion therapy.

## 2020-09-20 ENCOUNTER — Other Ambulatory Visit: Payer: Self-pay

## 2020-09-20 ENCOUNTER — Ambulatory Visit: Payer: Medicare HMO | Admitting: Pulmonary Disease

## 2020-09-20 ENCOUNTER — Encounter: Payer: Self-pay | Admitting: Pulmonary Disease

## 2020-09-20 VITALS — BP 110/70 | HR 64 | Temp 97.1°F | Ht 64.0 in | Wt 124.0 lb

## 2020-09-20 DIAGNOSIS — K219 Gastro-esophageal reflux disease without esophagitis: Secondary | ICD-10-CM

## 2020-09-20 DIAGNOSIS — R918 Other nonspecific abnormal finding of lung field: Secondary | ICD-10-CM | POA: Diagnosis not present

## 2020-09-20 NOTE — Progress Notes (Signed)
Subjective:    Patient ID: Kristina Jordan, female    DOB: [birth year], 70 y.o.   MRN: [id number]  HPI Rayetta Veith is a 70 year old lifelong never smoker who presents for evaluation of pulmonary nodules incidentally found on a CT scan of the chest performed on 31 July 2020.  She is kindly referred by [facility].  The patient does not endorse any symptoms associated with these nodules.  She has not had any shortness of breath, wheezing, cough, sputum production or hemoptysis.  She states that the CT in question was performed during an emergency department visit for evaluation of chest pain that radiated to the back.  The CT was a CT angio looking for dissection.  I have evaluated the films and have also showed the patient and her husband, who is present today during the visit, the film.  Patient has some apical lung scarring and small nodular densities throughout the lungs that appear postinflammatory.  The patient does endorse issues with gastroesophageal reflux at times with significant regurgitation.  Of note she is on Fosamax.  She has not had any fevers, chills or sweats.  No further chest pain since the issue noted in November.  She has not had any weight loss or anorexia.  She is concerned as she does have a strong history of cancer in the family.  Review of Systems A 10 point review of systems was performed and it is as noted above otherwise negative.   Past Medical History:  Diagnosis Date  . Hyperlipidemia   . MITRAL INSUFFICIENCY   . MITRAL VALVE PROLAPSE    Past Surgical History:  Procedure Laterality Date  . ABDOMINAL HYSTERECTOMY    . BREAST BIOPSY Left    stereo- neg  . BREAST EXCISIONAL BIOPSY Left    neg  . CERVICAL SPINE SURGERY    . COLONOSCOPY WITH PROPOFOL N/A 12/02/2016   Procedure: COLONOSCOPY WITH PROPOFOL;  Surgeon: Midge Minium, MD;  Location: [facility] ENDOSCOPY;  Service: Endoscopy;  Laterality: N/A;  . LEFT HEART CATHETERIZATION WITH CORONARY ANGIOGRAM  N/A 09/10/2011   Procedure: LEFT HEART CATHETERIZATION WITH CORONARY ANGIOGRAM;  Surgeon: Iran Ouch, MD;  Location: [facility] CATH [facility];  Service: Cardiovascular;  Laterality: N/A;   Patient Active Problem List   Diagnosis Date Noted  . Ganglion cyst of flexor tendon sheath of finger 12/30/2016  . Heme + stool   . Hypercholesterolemia without hypertriglyceridemia 11/19/2015  . Avitaminosis D 11/19/2015  . Atypical chest pain 09/09/2011  . Non-ST elevation MI (NSTEMI) (HCC) 09/09/2011  . 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 MITRAL INSUFFICIENCY 12/26/2009  . Mitral valve disorder 12/24/2009  . FIBRILLATION, ATRIAL 12/24/2009  . Heart disease 11/01/2009  . 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 Acquired trigger finger 01/02/2009  . Need for prophylactic hormone replacement therapy (postmenopausal) 10/09/2006  . OP (osteoporosis) 10/09/2006   Family History  Problem Relation Age of Onset  . Multiple sclerosis Sister   . Cancer Brother        bladder  . Heart attack Mother   . Cancer Sister        bladder  . Coronary artery disease Other   . Thyroid disease Other   . Breast cancer Neg Hx    Social History   Tobacco Use  . Smoking status: Former Smoker    Years: 0.00    Types: Cigarettes  . Smokeless tobacco: Never Used  . Tobacco comment: only socially in high school.   Substance Use Topics  .  Alcohol use: No   She has worked in [place] previously and exposed to an organic dusts.  Allergies  Allergen Reactions  . Sulfonamide Derivatives Rash   Current Meds  Medication Sig  . alendronate (FOSAMAX) 70 MG tablet TAKE 1 TABLET BY MOUTH ONCE A WEEK WITH  A  FULL  GLASS  OF  WATER  ON  AN  EMPTY  STOMACH.  DO  NOT  LIE  DOWN  UNTIL  AFTER  BREAKFAST  . aspirin 81 MG tablet Take 81 mg by mouth daily.  Marland Kitchen CALCIUM CITRATE PO Take 600 mg by mouth 2 (two) times daily.  . Cholecalciferol (VITAMIN D-3) 1000 UNITS CAPS Take 4,000 Units by mouth daily.   Marland Kitchen diltiazem (CARDIZEM) 120 MG tablet Take 1  tablet (120 mg total) by mouth daily.  . propranolol (INDERAL) 40 MG tablet Take 1 tablet (40 mg total) by mouth 2 (two) times daily.   Immunization History  Administered Date(s) Administered  . Fluad Quad(high Dose 65+) 06/29/2019, 07/20/2020  . H1N1 10/12/2008  . Influenza, High Dose Seasonal PF 07/31/2014, 08/02/2015, 07/18/2016, 08/04/2017, 07/14/2018  . Pneumococcal Conjugate-13 11/17/2014  . Pneumococcal Polysaccharide-23 06/02/2011, 11/07/2016  . Tdap 10/21/2010  . Zoster 10/27/2011      Objective:   Physical Exam BP 110/70 (BP Location: Left Arm, Cuff Size: Normal)   Pulse 64   Temp (!) 97.1 F (36.2 C) (Temporal)   Ht 5\' 4"  (1.626 m)   Wt 124 lb (56.2 kg)   SpO2 99%   BMI 21.28 kg/m  GENERAL: Slender, well-developed woman in no acute respiratory distress.  Fully ambulatory. HEAD: Normocephalic, atraumatic.  EYES: Pupils equal, round, reactive to light.  No scleral icterus.  MOUTH: Nose/mouth/throat not examined due to masking requirements for COVID 19. NECK: Supple. No thyromegaly. Trachea midline. No JVD.  No adenopathy. PULMONARY: Good air entry bilaterally.  No adventitious sounds. CARDIOVASCULAR: S1 and S2. Regular rate and rhythm.  Possible midsystolic click, no other murmurs or extra heart sounds. ABDOMEN: Benign. MUSCULOSKELETAL: No joint deformity, no clubbing, no edema.  NEUROLOGIC: No focal deficits, no gait disturbance, speech is fluent. SKIN: Intact,warm,dry. PSYCH: Mood and behavior normal.   Representative CT scan slices:  Mild apical scarring noted.   Small nodules right lower lobe, circled   Assessment & Plan:     ICD-10-CM   1. Multiple lung nodules on CT  R91.8 CT CHEST WO CONTRAST   These appear postinflammatory Repeat CT in 6 months, no contrast Query chronic silent aspiration  2. Gastroesophageal reflux disease, unspecified whether esophagitis present  K21.9    Antireflux measures Caution with Fosamax use May need PPI if continues  to have issues   Orders Placed This Encounter  Procedures  . CT CHEST WO CONTRAST    Standing Status:   Future    Standing Expiration Date:   09/20/2021    Scheduling Instructions:     42mo    Order Specific Question:   Preferred imaging location?    Answer:   [facility]   Discussion:  Patient was found to have very small nodules incidentally noted on CT scan.  These appear post inflammatory.  She had had a period where she did have significant gastroesophageal reflux may have led to chronic silent aspiration.  Recommend antireflux measures.  Patient is currently on Fosamax which can cause these issues even when caution was taken after taking the medication.  She has been instructed to monitor her symptoms.  She may need PPI  if she continues to have issues.  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  The patient was reassured that none of the findings are consistent with malignancy.  We will see the patient in follow-up after the 64-month CT scan is performed.  She is to contact us prior to that time should any new difficulties arise.  Renold Don, MD [facility]   *This note was dictated using voice recognition software/Dragon.  Despite best efforts to proofread, errors can occur which can change the meaning.  Any change was purely unintentional.

## 2020-09-20 NOTE — Patient Instructions (Signed)
We will get a follow-up CT scan of the chest in 6 months.  We will see her in follow-up after that is done.  Call sooner should any new difficulties arise.

## 2020-10-08 ENCOUNTER — Ambulatory Visit: Payer: Medicare HMO | Admitting: Cardiology

## 2020-10-08 ENCOUNTER — Telehealth: Payer: Self-pay

## 2020-10-08 NOTE — Telephone Encounter (Signed)
Copied from [place] [id number]. Topic: General - Other >> Oct 08, 2020  8:03 AM Celene Kras wrote: Reason for CRM: Pt called and is requesting to be signed up for the infusion clinic. She states that she tested positive on 10/07/20. Please advise.

## 2020-10-08 NOTE — Telephone Encounter (Signed)
Schedule virtual (video, if possible) visit to assess condition. Infusions are not readily available.

## 2020-11-08 DIAGNOSIS — I950 Idiopathic hypotension: Secondary | ICD-10-CM | POA: Insufficient documentation

## 2020-11-08 DIAGNOSIS — I420 Dilated cardiomyopathy: Secondary | ICD-10-CM | POA: Insufficient documentation

## 2020-11-08 NOTE — Progress Notes (Signed)
Cardiology Office Note   Date:  11/09/2020   ID:  KATRINA DADDONA, DOB [birth year], MRN [id number]  PCP:  Margo Common, PA-C  Cardiologist:   Minus Breeding, MD   Chief Complaint  Patient presents with  . Mitral Valve Prolapse      History of Present Illness: Kristina Jordan is a 70 y.o. 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   A cardiac catheterization which demonstrated normal coronary arteries. Her ejection fraction was low normal at that time although it was normal on the last echo. She did have some mild mitral valve prolapse with only mild regurgitation. She had a septal aneurysm with fenestrations incidentally noted.   Since I last saw her she has done well. She has some rare skipped beats. But she does not really feel any tachypalpitations routinely. She does housework. She exercises riding a bike.  The patient denies any new symptoms such as chest discomfort, neck or arm discomfort. There has been no new shortness of breath, PND or orthopnea. There have been no reported palpitations, presyncope or syncope.   Past Medical History:  Diagnosis Date  . Hyperlipidemia   . MITRAL INSUFFICIENCY   . MITRAL VALVE PROLAPSE     Past Surgical History:  Procedure Laterality Date  . ABDOMINAL HYSTERECTOMY    . BREAST BIOPSY Left    stereo- neg  . BREAST EXCISIONAL BIOPSY Left    neg  . CERVICAL SPINE SURGERY    . COLONOSCOPY WITH PROPOFOL N/A 12/02/2016   Procedure: COLONOSCOPY WITH PROPOFOL;  Surgeon: [name], MD;  Location: [facility] ENDOSCOPY;  Service: Endoscopy;  Laterality: N/A;  . LEFT HEART CATHETERIZATION WITH CORONARY ANGIOGRAM N/A 09/10/2011   Procedure: LEFT HEART CATHETERIZATION WITH CORONARY ANGIOGRAM;  Surgeon: Wellington Hampshire, MD;  Location: [facility] CATH LAB;  Service: Cardiovascular;  Laterality: N/A;     Current Outpatient Medications  Medication Sig Dispense Refill  . alendronate (FOSAMAX) 70 MG tablet TAKE 1  TABLET BY MOUTH ONCE A WEEK WITH  A  FULL  GLASS  OF  WATER  ON  AN  EMPTY  STOMACH.  DO  NOT  LIE  DOWN  UNTIL  AFTER  BREAKFAST 12 tablet 3  . aspirin 81 MG tablet Take 81 mg by mouth daily.    . Cholecalciferol (VITAMIN D-3) 1000 UNITS CAPS Take 4,000 Units by mouth daily.     Marland Kitchen diltiazem (CARDIZEM) 120 MG tablet Take 1 tablet (120 mg total) by mouth daily. 90 tablet 3  . propranolol (INDERAL) 40 MG tablet Take 1 tablet (40 mg total) by mouth 2 (two) times daily. 180 tablet 3   No current facility-administered medications for this visit.    Allergies:   Sulfonamide derivatives    ROS:  Please see the history of present illness.   Otherwise, review of systems are positive for none.   All other systems are reviewed and negative.    PHYSICAL EXAM: VS:  BP 108/60 (BP Location: Left Arm, Patient Position: Sitting)   Pulse 62   Ht 5\' 5"  (1.651 m)   Wt 119 lb 9.6 oz (54.3 kg)   SpO2 99%   BMI 19.90 kg/m  , BMI Body mass index is 19.9 kg/m. GENERAL:  Well appearing NECK:  No jugular venous distention, waveform within normal limits, carotid upstroke brisk and symmetric, no bruits, no thyromegaly LUNGS:  Clear to auscultation bilaterally CHEST:  Unremarkable HEART:  PMI not displaced or sustained,S1  and S2 within normal limits, no S3, no S4, no clicks, no rubs, 2 out of 6 brief systolic late murmur, no diastolic murmurs ABD:  Flat, positive bowel sounds normal in frequency in pitch, no bruits, no rebound, no guarding, no midline pulsatile mass, no hepatomegaly, no splenomegaly EXT:  2 plus pulses throughout, no edema, no cyanosis no clubbing   EKG:  EKG is   ordered today. The ekg ordered today demonstrates sinus rhythm, rate 62, axis within normal limits, intervals within normal limits, no acute changes.   Recent Labs: 12/01/2019: ALT 17; TSH 1.150 07/31/2020: BUN 18; Creatinine, Ser 0.91; Hemoglobin 12.7; Platelets 205; Potassium 3.9; Sodium 139    Lipid Panel    Component Value  Date/Time   CHOL 161 12/01/2019 1212   TRIG 89 12/01/2019 1212   HDL 77 12/01/2019 1212   CHOLHDL 2.1 12/01/2019 1212   CHOLHDL 2.5 09/09/2011 1646   VLDL 29 09/09/2011 1646   LDLCALC 68 12/01/2019 1212      Wt Readings from Last 3 Encounters:  11/09/20 119 lb 9.6 oz (54.3 kg)  09/20/20 124 lb (56.2 kg)  08/06/20 126 lb (57.2 kg)      Other studies Reviewed: Additional studies/ records that were reviewed today include:  Labs. Review of the above records demonstrates:  Please see elsewhere in the note.     ASSESSMENT AND PLAN:  MITRAL VALVE PROLAPSE -  Hear a slight murmur. She needs follow-up echocardiography.  CARDIOMYOPATHY Her EF was normal on previous echo and will be assessed with the study as above.  SEPTAL ANEURYSM This was an incidental finding and will be assessed again as above.  HYPOTENSION:   She has had some low blood pressures but no symptoms related to this. No change in therapy.   Current medicines are reviewed at length with the patient today.  The patient does not have concerns regarding medicines.  The following changes have been made:   None  Labs/ tests ordered today include:    Orders Placed This Encounter  Procedures  . EKG 12-Lead  . ECHOCARDIOGRAM COMPLETE     Disposition:   FU with me in 12 months.     Signed, Minus Breeding, MD  11/09/2020 11:13 AM    [place]

## 2020-11-09 ENCOUNTER — Other Ambulatory Visit: Payer: Self-pay

## 2020-11-09 ENCOUNTER — Ambulatory Visit: Payer: Medicare HMO | Admitting: Cardiology

## 2020-11-09 ENCOUNTER — Encounter: Payer: Self-pay | Admitting: Cardiology

## 2020-11-09 VITALS — BP 108/60 | HR 62 | Ht 65.0 in | Wt 119.6 lb

## 2020-11-09 DIAGNOSIS — I420 Dilated cardiomyopathy: Secondary | ICD-10-CM

## 2020-11-09 DIAGNOSIS — I950 Idiopathic hypotension: Secondary | ICD-10-CM

## 2020-11-09 DIAGNOSIS — I341 Nonrheumatic mitral (valve) prolapse: Secondary | ICD-10-CM

## 2020-11-09 NOTE — Patient Instructions (Signed)
Medication Instructions:  No changes *If you need a refill on your cardiac medications before your next appointment, please call your pharmacy*  Testing/Procedures: Your physician has requested that you have an ECHOCARDIOGRAM. Echocardiography is a painless test that uses sound waves to create images of your heart. It provides your doctor with information about the size and shape of your heart and how well your heart's chambers and valves are working. This procedure takes approximately one hour. There are no restrictions for this procedure. [place]  Follow-Up: At [facility], you and your health needs are our priority.  As part of our continuing mission to provide you with exceptional heart care, we have created designated Provider Care Teams.  These Care Teams include your primary Cardiologist (physician) and Advanced Practice Providers (APPs -  Physician Assistants and Nurse Practitioners) who all work together to provide you with the care you need, when you need it.  Your next appointment:   12 month(s) You will receive a reminder letter in the mail two months in advance. If you don't receive a letter, please call our office to schedule the follow-up appointment.  The format for your next appointment:   In Person  Provider:   Minus Breeding, MD

## 2020-12-04 ENCOUNTER — Telehealth: Payer: Self-pay | Admitting: *Deleted

## 2020-12-04 NOTE — Telephone Encounter (Signed)
Pt called to discuss the result of her TSH from 12/01/19; pt notified result of 1.150 and 1.20 from 11/2018; she verbalized understanding.

## 2020-12-05 ENCOUNTER — Other Ambulatory Visit: Payer: Self-pay

## 2020-12-05 ENCOUNTER — Ambulatory Visit (HOSPITAL_COMMUNITY): Payer: Medicare HMO | Attending: Internal Medicine

## 2020-12-05 DIAGNOSIS — I341 Nonrheumatic mitral (valve) prolapse: Secondary | ICD-10-CM

## 2020-12-05 LAB — ECHOCARDIOGRAM COMPLETE
Area-P 1/2: 3.02 cm2
S' Lateral: 2.9 cm

## 2020-12-10 ENCOUNTER — Encounter: Payer: Medicare HMO | Admitting: Family Medicine

## 2020-12-11 ENCOUNTER — Telehealth: Payer: Self-pay | Admitting: Cardiology

## 2020-12-11 NOTE — Telephone Encounter (Signed)
Patient was wondering if the results from her Echocardiogram 12/05/20 were in yet. The patient has not heard anything. Please call to discuss when ready

## 2020-12-11 NOTE — Telephone Encounter (Signed)
Will forward to Dr Hochrein for review  

## 2020-12-17 NOTE — Progress Notes (Addendum)
Subjective:   Kristina Jordan is a 70 y.o. female who presents for Medicare Annual (Subsequent) preventive examination.  I connected with Kristina Jordan today by telephone and verified that I am speaking with the correct person using two identifiers. Location patient: home Location provider: work Persons participating in the virtual visit: patient, provider.   I discussed the limitations, risks, security and privacy concerns of performing an evaluation and management service by telephone and the availability of in person appointments. I also discussed with the patient that there may be a patient responsible charge related to this service. The patient expressed understanding and verbally consented to this telephonic visit.    Interactive audio and video telecommunications were attempted between this provider and patient, however failed, due to patient having technical difficulties OR patient did not have access to video capability.  We continued and completed visit with audio only.   Review of Systems    N/A  Cardiac Risk Factors include: advanced age (>97men, >47 women);hypertension     Objective:    There were no vitals filed for this visit. There is no height or weight on file to calculate BMI.  Advanced Directives 12/18/2020 07/31/2020 12/01/2019 11/29/2018 11/26/2017 12/02/2016 11/07/2016  Does Patient Have a Medical Advance Directive? Yes No Yes Yes Yes Yes Yes  Type of Advance Directive Living will - Living will [place];Living will Living will Living will Out of facility DNR (pink MOST or yellow form)  Copy of [place] in Chart? - - - No - copy requested - - -  Would patient like information on creating a medical advance directive? - - - - - - Yes (ED - Information included in AVS)  Pre-existing out of facility DNR order (yellow form or pink MOST form) - - - - - - -    Current Medications (verified) Outpatient Encounter Medications as of  12/18/2020  Medication Sig   alendronate (FOSAMAX) 70 MG tablet TAKE 1 TABLET BY MOUTH ONCE A WEEK WITH  A  FULL  GLASS  OF  WATER  ON  AN  EMPTY  STOMACH.  DO  NOT  LIE  DOWN  UNTIL  AFTER  BREAKFAST   aspirin 81 MG tablet Take 81 mg by mouth daily.   Cholecalciferol (VITAMIN D-3) 1000 UNITS CAPS Take 4,000 Units by mouth daily.    diltiazem (CARDIZEM) 120 MG tablet Take 1 tablet (120 mg total) by mouth daily.   propranolol (INDERAL) 40 MG tablet Take 1 tablet (40 mg total) by mouth 2 (two) times daily.   No facility-administered encounter medications on file as of 12/18/2020.    Allergies (verified) Sulfonamide derivatives   History: Past Medical History:  Diagnosis Date   Hyperlipidemia    MITRAL INSUFFICIENCY    MITRAL VALVE PROLAPSE    Past Surgical History:  Procedure Laterality Date   ABDOMINAL HYSTERECTOMY     BREAST BIOPSY Left    stereo- neg   BREAST EXCISIONAL BIOPSY Left    neg   CERVICAL SPINE SURGERY     COLONOSCOPY WITH PROPOFOL N/A 12/02/2016   Procedure: COLONOSCOPY WITH PROPOFOL;  Surgeon: [name], MD;  Location: [facility] ENDOSCOPY;  Service: Endoscopy;  Laterality: N/A;   LEFT HEART CATHETERIZATION WITH CORONARY ANGIOGRAM N/A 09/10/2011   Procedure: LEFT HEART CATHETERIZATION WITH CORONARY ANGIOGRAM;  Surgeon: Wellington Hampshire, MD;  Location: [facility];  Service: Cardiovascular;  Laterality: N/A;   Family History  Problem Relation Age of Onset  Multiple sclerosis Sister    Cancer Brother        bladder   Heart attack Mother    Cancer Sister        bladder   Coronary artery disease Other    Thyroid disease Other    Breast cancer Neg Hx    Social History   Socioeconomic History   Marital status: Married    Spouse name: Not on file   Number of children: 2   Years of education: Not on file   Highest education level: 12th grade  Occupational History   Occupation: retired  Tobacco Use   Smoking status: Former Smoker    Years: 0.00    Types:  Cigarettes   Smokeless tobacco: Never Used   Tobacco comment: only socially in high school.   Vaping Use   Vaping Use: Never used  Substance and Sexual Activity   Alcohol use: No   Drug use: No   Sexual activity: Yes  Other Topics Concern   Not on file  Social History Narrative   Not on file   Social Determinants of Health   Financial Resource Strain: Low Risk    Difficulty of Paying Living Expenses: Not hard at all  Food Insecurity: No Food Insecurity   Worried About Charity fundraiser in the Last Year: Never true   [place] in the Last Year: Never true  Transportation Needs: No Transportation Needs   Lack of Transportation (Medical): No   Lack of Transportation (Non-Medical): No  Physical Activity: Insufficiently Active   Days of Exercise per Week: 3 days   Minutes of Exercise per Session: 30 min  Stress: No Stress Concern Present   Feeling of Stress : Not at all  Social Connections: Moderately Integrated   Frequency of Communication with Friends and Family: More than three times a week   Frequency of Social Gatherings with Friends and Family: Twice a week   Attends Religious Services: More than 4 times per year   Active Member of [employer] or Organizations: No   Attends Archivist Meetings: Never   Marital Status: Married    Tobacco Counseling Counseling given: Not Answered Comment: only socially in high school.    Clinical Intake:  Pre-visit preparation completed: Yes  Pain : No/denies pain     Nutritional Risks: None Diabetes: No  How often do you need to have someone help you when you read instructions, pamphlets, or other written materials from your doctor or pharmacy?: 1 - Never  Diabetic? No  Interpreter Needed?: No  Information entered by :: [facility], LPN   Activities of Daily Living In your present state of health, do you have any difficulty performing the following activities: 12/18/2020 08/06/2020  Hearing? N N  Vision? N N   Difficulty concentrating or making decisions? N N  Walking or climbing stairs? N N  Dressing or bathing? N N  Doing errands, shopping? N N  Preparing Food and eating ? N -  Using the Toilet? N -  In the past six months, have you accidently leaked urine? Y -  Comment Occasionally with urges. -  Do you have problems with loss of bowel control? Y -  Comment Rarely -  Managing your Medications? N -  Managing your Finances? N -  Housekeeping or managing your Housekeeping? N -  Some recent data might be hidden    Patient Care Team: Chrismon, Vickki Muff, PA-C as PCP - General (Family Medicine) Hochrein,  Jeneen Rinks, MD as PCP - Cardiology (Cardiology) Dingeldein, Remo Lipps, MD as Consulting Physician (Ophthalmology) Dasher, Rayvon Char, MD (Dermatology) Tyler Pita, MD as Consulting Physician (Pulmonary Disease)  Indicate any recent Medical Services you may have received from other than [facility] providers in the past year (date may be approximate).     Assessment:   This is a routine wellness examination for [place].  Hearing/Vision screen No exam data present  Dietary issues and exercise activities discussed: Current Exercise Habits: Home exercise routine, Type of exercise: Other - see comments (rides a stationary bike), Time (Minutes): 30, Frequency (Times/Week): 3, Weekly Exercise (Minutes/Week): 90, Intensity: Mild, Exercise limited by: None identified  Goals      DIET - INCREASE WATER INTAKE     Recommend increasing water intake to 6-8 8 oz glasses a day.      Prevent falls     Recommend to remove any items from the home that may cause slips or trips.       Depression Screen PHQ 2/9 Scores 12/18/2020 08/06/2020 12/01/2019 11/29/2018 11/26/2017 11/07/2016 11/19/2015  PHQ - 2 Score 1 2 0 2 1 0 0  PHQ- 9 Score - 6 - 2 4 - -    Fall Risk Fall Risk  12/18/2020 08/06/2020 12/01/2019 11/29/2018 11/26/2017  Falls in the past year? 1 0 0 0 No  Number falls in past yr: 0 0 0 - -  Injury with Fall?  0 0 0 - -  Follow up Falls prevention discussed - - - -    FALL RISK PREVENTION PERTAINING TO THE HOME:  Any stairs in or around the home? No  If so, are there any without handrails? No  Home free of loose throw rugs in walkways, pet beds, electrical cords, etc? Yes  Adequate lighting in your home to reduce risk of falls? Yes   ASSISTIVE DEVICES UTILIZED TO PREVENT FALLS:  Life alert? No  Use of a cane, walker or w/c? No  Grab bars in the bathroom? Yes  Shower chair or bench in shower? No  Elevated toilet seat or a handicapped toilet? Yes    Cognitive Function: Normal cognitive status assessed by observation by this Nurse Health Advisor. No abnormalities found.       6CIT Screen 11/07/2016  What Year? 0 points  What month? 0 points  What time? 0 points  Count back from 20 0 points  Months in reverse 0 points  Repeat phrase 4 points  Total Score 4    Immunizations Immunization History  Administered Date(s) Administered   Fluad Quad(high Dose 65+) 06/29/2019, 07/20/2020   H1N1 10/12/2008   Influenza, High Dose Seasonal PF 07/31/2014, 08/02/2015, 07/18/2016, 08/04/2017, 07/14/2018   Pneumococcal Conjugate-13 11/17/2014   Pneumococcal Polysaccharide-23 06/02/2011, 11/07/2016   Tdap 10/21/2010   Zoster 10/27/2011    TDAP status: Due, Education has been provided regarding the importance of this vaccine. Advised may receive this vaccine at local pharmacy or Health Dept. Aware to provide a copy of the vaccination record if obtained from local pharmacy or Health Dept. Verbalized acceptance and understanding.  Flu Vaccine status: Up to date  Pneumococcal vaccine status: Up to date  Covid-19 vaccine status: Declined, Education has been provided regarding the importance of this vaccine but patient still declined. Advised may receive this vaccine at local pharmacy or Health Dept.or vaccine clinic. Aware to provide a copy of the vaccination record if obtained from local pharmacy  or Health Dept. Verbalized acceptance and understanding.  Qualifies for Shingles  Vaccine? Yes   Zostavax completed Yes   Shingrix Completed?: No.    Education has been provided regarding the importance of this vaccine. Patient has been advised to call insurance company to determine out of pocket expense if they have not yet received this vaccine. Advised may also receive vaccine at local pharmacy or Health Dept. Verbalized acceptance and understanding.  Screening Tests Health Maintenance  Topic Date Due   COVID-19 Vaccine (1) 01/03/2021 (Originally 01/18/1956)   TETANUS/TDAP  12/18/2021 (Originally 10/21/2020)   INFLUENZA VACCINE  04/15/2021   MAMMOGRAM  04/24/2021   DEXA SCAN  01/08/2022   COLONOSCOPY (Pts 45-84yrs Insurance coverage will need to be confirmed)  12/03/2026   Hepatitis C Screening  Completed   PNA vac Low Risk Adult  Completed   HPV VACCINES  Aged Out    Health Maintenance  There are no preventive care reminders to display for this patient.  Colorectal cancer screening: Type of screening: Colonoscopy. Completed 12/02/16. Repeat every 10 years  Mammogram status: Ordered today. Pt provided with contact info and advised to call to schedule appt.   Bone Density status: Completed 01/09/20. Results reflect: Bone density results: OSTEOPENIA. Repeat every 2 years.  Lung Cancer Screening: (Low Dose CT Chest recommended if Age 57-80 years, 30 pack-year currently smoking OR have quit w/in 15years.) does not qualify.   Additional Screening:  Hepatitis C Screening: Up to date  Vision Screening: Recommended annual ophthalmology exams for early detection of glaucoma and other disorders of the eye. Is the patient up to date with their annual eye exam?  Yes  Who is the provider or what is the name of the office in which the patient attends annual eye exams? Dr Dingeldein @ [place] If pt is not established with a provider, would they like to be referred to a provider to establish care? No  .   Dental Screening: Recommended annual dental exams for proper oral hygiene  Community Resource Referral / Chronic Care Management: CRR required this visit?  No   CCM required this visit?  No      Plan:     I have personally reviewed and noted the following in the patient's chart:   Medical and social history Use of alcohol, tobacco or illicit drugs  Current medications and supplements Functional ability and status Nutritional status Physical activity Advanced directives List of other physicians Hospitalizations, surgeries, and ER visits in previous 12 months Vitals Screenings to include cognitive, depression, and falls Referrals and appointments  In addition, I have reviewed and discussed with patient certain preventive protocols, quality metrics, and best practice recommendations. A written personalized care plan for preventive services as well as general preventive health recommendations were provided to patient.     Edge Mauger [place], [place]   01/22/7415   Nurse Notes: None.  Reviewed note and plan of Nurse Health advisor. Agree with documentation and recommendations.

## 2020-12-18 ENCOUNTER — Other Ambulatory Visit: Payer: Self-pay

## 2020-12-18 ENCOUNTER — Ambulatory Visit (INDEPENDENT_AMBULATORY_CARE_PROVIDER_SITE_OTHER): Payer: Medicare HMO

## 2020-12-18 DIAGNOSIS — Z1231 Encounter for screening mammogram for malignant neoplasm of breast: Secondary | ICD-10-CM

## 2020-12-18 DIAGNOSIS — Z0000 Encounter for general adult medical examination without abnormal findings: Secondary | ICD-10-CM

## 2020-12-18 NOTE — Patient Instructions (Signed)
Kristina Jordan , Thank you for taking time to come for your Medicare Wellness Visit. I appreciate your ongoing commitment to your health goals. Please review the following plan we discussed and let me know if I can assist you in the future.   Screening recommendations/referrals: Colonoscopy: Up to date, due 11/2026 Mammogram: Ordered today. Pt provided with contact info and advised to call to schedule appt.  Bone Density: Up to date, due 12/2021 Recommended yearly ophthalmology/optometry visit for glaucoma screening and checkup Recommended yearly dental visit for hygiene and checkup  Vaccinations: Influenza vaccine: Done 07/20/20 Pneumococcal vaccine: Completed series Tdap vaccine: Currently due, declined receiving. Shingles vaccine: Shingrix discussed. Please contact your pharmacy for coverage information.     Advanced directives: Please bring a copy of your POA (Power of Attorney) and/or Living Will to your next appointment.   Conditions/risks identified: Fall risk preventatives discussed today. Recommend to increase water intake to 6-8 8 oz glasses a day.  Next appointment: 01/15/21 @ 1:20 PM with [place] 65 Years and Older, Female Preventive care refers to lifestyle choices and visits with your health care provider that can promote health and wellness. What does preventive care include?  A yearly physical exam. This is also called an annual well check.  Dental exams once or twice a year.  Routine eye exams. Ask your health care provider how often you should have your eyes checked.  Personal lifestyle choices, including:  Daily care of your teeth and gums.  Regular physical activity.  Eating a healthy diet.  Avoiding tobacco and drug use.  Limiting alcohol use.  Practicing safe sex.  Taking low-dose aspirin every day.  Taking vitamin and mineral supplements as recommended by your health care provider. What happens during an annual well check? The  services and screenings done by your health care provider during your annual well check will depend on your age, overall health, lifestyle risk factors, and family history of disease. Counseling  Your health care provider may ask you questions about your:  Alcohol use.  Tobacco use.  Drug use.  Emotional well-being.  Home and relationship well-being.  Sexual activity.  Eating habits.  History of falls.  Memory and ability to understand (cognition).  Work and work Statistician.  Reproductive health. Screening  You may have the following tests or measurements:  Height, weight, and BMI.  Blood pressure.  Lipid and cholesterol levels. These may be checked every 5 years, or more frequently if you are over 70 years old.  Skin check.  Lung cancer screening. You may have this screening every year starting at age 39 if you have a 30-pack-year history of smoking and currently smoke or have quit within the past 15 years.  Fecal occult blood test (FOBT) of the stool. You may have this test every year starting at age 89.  Flexible sigmoidoscopy or colonoscopy. You may have a sigmoidoscopy every 5 years or a colonoscopy every 10 years starting at age 68.  Hepatitis C blood test.  Hepatitis B blood test.  Sexually transmitted disease (STD) testing.  Diabetes screening. This is done by checking your blood sugar (glucose) after you have not eaten for a while (fasting). You may have this done every 1-3 years.  Bone density scan. This is done to screen for osteoporosis. You may have this done starting at age 67.  Mammogram. This may be done every 1-2 years. Talk to your health care provider about how often you should have regular mammograms.  Talk with your health care provider about your test results, treatment options, and if necessary, the need for more tests. Vaccines  Your health care provider may recommend certain vaccines, such as:  Influenza vaccine. This is recommended  every year.  Tetanus, diphtheria, and acellular pertussis (Tdap, Td) vaccine. You may need a Td booster every 10 years.  Zoster vaccine. You may need this after age 30.  Pneumococcal 13-valent conjugate (PCV13) vaccine. One dose is recommended after age 15.  Pneumococcal polysaccharide (PPSV23) vaccine. One dose is recommended after age 55. Talk to your health care provider about which screenings and vaccines you need and how often you need them. This information is not intended to replace advice given to you by your health care provider. Make sure you discuss any questions you have with your health care provider. Document Released: 09/28/2015 Document Revised: 05/21/2016 Document Reviewed: 07/03/2015 Elsevier Interactive Patient Education  2017 [place] Prevention in the Home Falls can cause injuries. They can happen to people of all ages. There are many things you can do to make your home safe and to help prevent falls. What can I do on the outside of my home?  Regularly fix the edges of walkways and driveways and fix any cracks.  Remove anything that might make you trip as you walk through a door, such as a raised step or threshold.  Trim any bushes or trees on the path to your home.  Use bright outdoor lighting.  Clear any walking paths of anything that might make someone trip, such as rocks or tools.  Regularly check to see if handrails are loose or broken. Make sure that both sides of any steps have handrails.  Any raised decks and porches should have guardrails on the edges.  Have any leaves, snow, or ice cleared regularly.  Use sand or salt on walking paths during winter.  Clean up any spills in your garage right away. This includes oil or grease spills. What can I do in the bathroom?  Use night lights.  Install grab bars by the toilet and in the tub and shower. Do not use towel bars as grab bars.  Use non-skid mats or decals in the tub or shower.  If  you need to sit down in the shower, use a plastic, non-slip stool.  Keep the floor dry. Clean up any water that spills on the floor as soon as it happens.  Remove soap buildup in the tub or shower regularly.  Attach bath mats securely with double-sided non-slip rug tape.  Do not have throw rugs and other things on the floor that can make you trip. What can I do in the bedroom?  Use night lights.  Make sure that you have a light by your bed that is easy to reach.  Do not use any sheets or blankets that are too big for your bed. They should not hang down onto the floor.  Have a firm chair that has side arms. You can use this for support while you get dressed.  Do not have throw rugs and other things on the floor that can make you trip. What can I do in the kitchen?  Clean up any spills right away.  Avoid walking on wet floors.  Keep items that you use a lot in easy-to-reach places.  If you need to reach something above you, use a strong step stool that has a grab bar.  Keep electrical cords out of the way.  Do not use floor polish or wax that makes floors slippery. If you must use wax, use non-skid floor wax.  Do not have throw rugs and other things on the floor that can make you trip. What can I do with my stairs?  Do not leave any items on the stairs.  Make sure that there are handrails on both sides of the stairs and use them. Fix handrails that are broken or loose. Make sure that handrails are as long as the stairways.  Check any carpeting to make sure that it is firmly attached to the stairs. Fix any carpet that is loose or worn.  Avoid having throw rugs at the top or bottom of the stairs. If you do have throw rugs, attach them to the floor with carpet tape.  Make sure that you have a light switch at the top of the stairs and the bottom of the stairs. If you do not have them, ask someone to add them for you. What else can I do to help prevent falls?  Wear shoes  that:  Do not have high heels.  Have rubber bottoms.  Are comfortable and fit you well.  Are closed at the toe. Do not wear sandals.  If you use a stepladder:  Make sure that it is fully opened. Do not climb a closed stepladder.  Make sure that both sides of the stepladder are locked into place.  Ask someone to hold it for you, if possible.  Clearly mark and make sure that you can see:  Any grab bars or handrails.  First and last steps.  Where the edge of each step is.  Use tools that help you move around (mobility aids) if they are needed. These include:  Canes.  Walkers.  Scooters.  Crutches.  Turn on the lights when you go into a dark area. Replace any light bulbs as soon as they burn out.  Set up your furniture so you have a clear path. Avoid moving your furniture around.  If any of your floors are uneven, fix them.  If there are any pets around you, be aware of where they are.  Review your medicines with your doctor. Some medicines can make you feel dizzy. This can increase your chance of falling. Ask your doctor what other things that you can do to help prevent falls. This information is not intended to replace advice given to you by your health care provider. Make sure you discuss any questions you have with your health care provider. Document Released: 06/28/2009 Document Revised: 02/07/2016 Document Reviewed: 10/06/2014 Elsevier Interactive Patient Education  2017 [employer].

## 2020-12-19 NOTE — Telephone Encounter (Signed)
Waylan Rocher, LPN  4/36/0677 0:34 PM EDT      Pt informed of providers result & recommendations. Pt verbalized understanding. No further questions . APPT SCHEDULED   Minus Breeding, MD  12/12/2020 6:41 PM EDT      The MR is mild. The atrial septum looks unchanged. I would like to move her appt up to six month follow up not 12 months. Call Ms. Loper with the results and send results to Chrismon, Vickki Muff, PA-C

## 2020-12-31 ENCOUNTER — Ambulatory Visit: Payer: Medicare HMO | Admitting: Family Medicine

## 2021-01-07 DIAGNOSIS — H524 Presbyopia: Secondary | ICD-10-CM | POA: Diagnosis not present

## 2021-01-15 ENCOUNTER — Ambulatory Visit (INDEPENDENT_AMBULATORY_CARE_PROVIDER_SITE_OTHER): Payer: Medicare HMO | Admitting: Family Medicine

## 2021-01-15 ENCOUNTER — Other Ambulatory Visit: Payer: Self-pay

## 2021-01-15 ENCOUNTER — Encounter: Payer: Self-pay | Admitting: Family Medicine

## 2021-01-15 VITALS — BP 90/51 | HR 65 | Ht 64.0 in | Wt 117.0 lb

## 2021-01-15 DIAGNOSIS — M810 Age-related osteoporosis without current pathological fracture: Secondary | ICD-10-CM | POA: Diagnosis not present

## 2021-01-15 DIAGNOSIS — I059 Rheumatic mitral valve disease, unspecified: Secondary | ICD-10-CM | POA: Diagnosis not present

## 2021-01-15 DIAGNOSIS — E7800 Pure hypercholesterolemia, unspecified: Secondary | ICD-10-CM

## 2021-01-15 DIAGNOSIS — Z0000 Encounter for general adult medical examination without abnormal findings: Secondary | ICD-10-CM

## 2021-01-15 DIAGNOSIS — E559 Vitamin D deficiency, unspecified: Secondary | ICD-10-CM

## 2021-01-15 NOTE — Progress Notes (Signed)
Complete physical exam   Patient: Kristina Jordan   DOB: 01-08-1951   70 y.o. Female  MRN: [id number] Visit Date: 01/15/2021  Today's healthcare provider: Vernie Murders, PA-C   No chief complaint on file.  Subjective    Kristina Jordan is a 69 y.o. female who presents today for a complete physical exam.  She reports consuming a general diet. Exercises some. She generally feels well. She reports sleeping fairly well. She does not have additional problems to discuss today.      Past Medical History:  Diagnosis Date  . Hyperlipidemia   . MITRAL INSUFFICIENCY   . MITRAL VALVE PROLAPSE    Past Surgical History:  Procedure Laterality Date  . ABDOMINAL HYSTERECTOMY    . BREAST BIOPSY Left    stereo- neg  . BREAST EXCISIONAL BIOPSY Left    neg  . CERVICAL SPINE SURGERY    . COLONOSCOPY WITH PROPOFOL N/A 12/02/2016   Procedure: COLONOSCOPY WITH PROPOFOL;  Surgeon: [name], MD;  Location: [facility] ENDOSCOPY;  Service: Endoscopy;  Laterality: N/A;  . LEFT HEART CATHETERIZATION WITH CORONARY ANGIOGRAM N/A 09/10/2011   Procedure: LEFT HEART CATHETERIZATION WITH CORONARY ANGIOGRAM;  Surgeon: Wellington Hampshire, MD;  Location: [facility];  Service: Cardiovascular;  Laterality: N/A;   Social History   Socioeconomic History  . Marital status: Married    Spouse name: Not on file  . Number of children: 2  . Years of education: Not on file  . Highest education level: 12th grade  Occupational History  . Occupation: retired  Tobacco Use  . Smoking status: Former Smoker    Years: 0.00    Types: Cigarettes  . Smokeless tobacco: Never Used  . Tobacco comment: only socially in high school.   Vaping Use  . Vaping Use: Never used  Substance and Sexual Activity  . Alcohol use: No  . Drug use: No  . Sexual activity: Yes  Other Topics Concern  . Not on file  Social History Narrative  . Not on file   Social Determinants of Health   Financial Resource Strain: Low Risk   .  Difficulty of Paying Living Expenses: Not hard at all  Food Insecurity: No Food Insecurity  . Worried About Charity fundraiser in the Last Year: Never true  . Ran Out of Food in the Last Year: Never true  Transportation Needs: No Transportation Needs  . Lack of Transportation (Medical): No  . Lack of Transportation (Non-Medical): No  Physical Activity: Insufficiently Active  . Days of Exercise per Week: 3 days  . Minutes of Exercise per Session: 30 min  Stress: No Stress Concern Present  . Feeling of Stress : Not at all  Social Connections: Moderately Integrated  . Frequency of Communication with Friends and Family: More than three times a week  . Frequency of Social Gatherings with Friends and Family: Twice a week  . Attends Religious Services: More than 4 times per year  . Active Member of Clubs or Organizations: No  . Attends Archivist Meetings: Never  . Marital Status: Married  Human resources officer Violence: Not At Risk  . Fear of Current or Ex-Partner: No  . Emotionally Abused: No  . Physically Abused: No  . Sexually Abused: No   Family Status  Relation Name Status  . Father  Deceased  . Sister  Deceased  . Brother  Alive  . Mother  Deceased  . Sister  Alive  . Sister  Alive  . Brother  Alive  . Brother  Alive  . Other  (Not Specified)  . Neg Hx  (Not Specified)   Family History  Problem Relation Age of Onset  . Multiple sclerosis Sister   . Cancer Brother        bladder  . Heart attack Mother   . Cancer Sister        bladder  . Coronary artery disease Other   . Thyroid disease Other   . Breast cancer Neg Hx    Allergies  Allergen Reactions  . Sulfonamide Derivatives Rash    Patient Care Team: Neko Boyajian, Vickki Muff, PA-C as PCP - General (Family Medicine) Minus Breeding, MD as PCP - Cardiology (Cardiology) Dingeldein, Remo Lipps, MD as Consulting Physician (Ophthalmology) Dasher, Rayvon Char, MD (Dermatology) Tyler Pita, MD as Consulting Physician  (Pulmonary Disease)   Medications: Outpatient Medications Prior to Visit  Medication Sig  . alendronate (FOSAMAX) 70 MG tablet TAKE 1 TABLET BY MOUTH ONCE A WEEK WITH  A  FULL  GLASS  OF  WATER  ON  AN  EMPTY  STOMACH.  DO  NOT  LIE  DOWN  UNTIL  AFTER  BREAKFAST  . aspirin 81 MG tablet Take 81 mg by mouth daily.  . Cholecalciferol (VITAMIN D-3) 1000 UNITS CAPS Take 4,000 Units by mouth daily.   Marland Kitchen diltiazem (CARDIZEM) 120 MG tablet Take 1 tablet (120 mg total) by mouth daily.  . propranolol (INDERAL) 40 MG tablet Take 1 tablet (40 mg total) by mouth 2 (two) times daily.   No facility-administered medications prior to visit.    Review of Systems  Constitutional: Negative.   HENT: Negative.   Eyes: Negative.   Respiratory: Negative.   Cardiovascular: Negative.   Gastrointestinal: Negative.   Endocrine: Negative.   Genitourinary: Negative.   Musculoskeletal: Negative.   Skin: Negative.   Neurological: Negative.   Hematological: Negative.   Psychiatric/Behavioral: Negative.       Objective    BP (!) 90/51 (BP Location: Right Arm, Patient Position: Sitting, Cuff Size: Normal)   Pulse 65   Ht 5\' 4"  (1.626 m)   Wt 117 lb (53.1 kg)   SpO2 99%   BMI 20.08 kg/m  Wt Readings from Last 3 Encounters:  01/15/21 117 lb (53.1 kg)  11/09/20 119 lb 9.6 oz (54.3 kg)  09/20/20 124 lb (56.2 kg)   BP Readings from Last 3 Encounters:  01/15/21 (!) 90/51  11/09/20 108/60  09/20/20 110/70   Physical Exam Constitutional:      Appearance: She is well-developed.  HENT:     Head: Normocephalic and atraumatic.     Right Ear: External ear normal.     Left Ear: External ear normal.     Nose: Nose normal.  Eyes:     General:        Right eye: No discharge.     Conjunctiva/sclera: Conjunctivae normal.     Pupils: Pupils are equal, round, and reactive to light.  Neck:     Thyroid: No thyromegaly.     Trachea: No tracheal deviation.  Cardiovascular:     Rate and Rhythm: Normal rate and  regular rhythm.     Heart sounds: Normal heart sounds. No murmur heard.   Pulmonary:     Effort: Pulmonary effort is normal. No respiratory distress.     Breath sounds: Normal breath sounds. No wheezing or rales.  Chest:     Chest wall: No tenderness.  Abdominal:  General: There is no distension.     Palpations: Abdomen is soft. There is no mass.     Tenderness: There is no abdominal tenderness. There is no guarding or rebound.  Musculoskeletal:        General: No tenderness. Normal range of motion.     Cervical back: Normal range of motion and neck supple.  Lymphadenopathy:     Cervical: No cervical adenopathy.  Skin:    General: Skin is warm and dry.     Findings: No erythema or rash.  Neurological:     Mental Status: She is alert and oriented to person, place, and time.     Cranial Nerves: No cranial nerve deficit.     Motor: No abnormal muscle tone.     Coordination: Coordination normal.     Deep Tendon Reflexes: Reflexes are normal and symmetric. Reflexes normal.  Psychiatric:        Behavior: Behavior normal.        Thought Content: Thought content normal.        Judgment: Judgment normal.      Last depression screening scores PHQ 2/9 Scores 12/18/2020 08/06/2020 12/01/2019  PHQ - 2 Score 1 2 0  PHQ- 9 Score - 6 -   Last fall risk screening Fall Risk  12/18/2020  Falls in the past year? 1  Number falls in past yr: 0  Injury with Fall? 0  Follow up Falls prevention discussed   Last Audit-C alcohol use screening Alcohol Use Disorder Test (AUDIT) 12/18/2020  1. How often do you have a drink containing alcohol? 0  2. How many drinks containing alcohol do you have on a typical day when you are drinking? 0  3. How often do you have six or more drinks on one occasion? 0  AUDIT-C Score 0  Alcohol Brief Interventions/Follow-up -   A score of 3 or more in women, and 4 or more in men indicates increased risk for alcohol abuse, EXCEPT if all of the points are from question  1   No results found for any visits on 01/15/21.  Assessment & Plan    Routine Health Maintenance and Physical Exam  Exercise Activities and Dietary recommendations Goals    . DIET - INCREASE WATER INTAKE     Recommend increasing water intake to 6-8 8 oz glasses a day.     . Prevent falls     Recommend to remove any items from the home that may cause slips or trips.       Immunization History  Administered Date(s) Administered  . Fluad Quad(high Dose 65+) 06/29/2019, 07/20/2020  . H1N1 10/12/2008  . Influenza, High Dose Seasonal PF 07/31/2014, 08/02/2015, 07/18/2016, 08/04/2017, 07/14/2018  . Pneumococcal Conjugate-13 11/17/2014  . Pneumococcal Polysaccharide-23 06/02/2011, 11/07/2016  . Tdap 10/21/2010  . Zoster 10/27/2011    Health Maintenance  Topic Date Due  . COVID-19 Vaccine (1) Never done  . TETANUS/TDAP  12/18/2021 (Originally 10/21/2020)  . INFLUENZA VACCINE  04/15/2021  . MAMMOGRAM  04/24/2021  . DEXA SCAN  01/08/2022  . COLONOSCOPY (Pts 45-73yrs Insurance coverage will need to be confirmed)  12/03/2026  . Hepatitis C Screening  Completed  . PNA vac Low Risk Adult  Completed  . HPV VACCINES  Aged Out    Discussed health benefits of physical activity, and encouraged her to engage in regular exercise appropriate for her age and condition.  1. Encounter for Medicare annual wellness exam General health stable. Very thin individual. Has lost  6-7 lbs since January 2022 (had COVID infection and about of gastroenteritis). Mammograms scheduled of 04-25-21. Recheck routine labs. - CBC with Differential/Platelet - Comprehensive metabolic panel - Lipid panel - TSH  2. Mitral valve disorder Followed by cardiologist (Dr. Percival Spanish). States he noticed mild MR with MVP on Echocardiogram. Recheck labs. - CBC with Differential/Platelet - Lipid panel  3. Hypercholesterolemia without hypertriglyceridemia Stable and back to normal with exercise and low fat diet. - CBC with  Differential/Platelet - Comprehensive metabolic panel - Lipid panel - TSH  4. Avitaminosis D Still taking 4000 IU Vit. D daily. Recheck levels. Energy level still low since having COVID infection January 2022 and viral gastroenteritis April 2022. - CBC with Differential/Platelet - VITAMIN D 25 Hydroxy (Vit-D Deficiency, Fractures)  5. Osteoporosis without current pathological fracture, unspecified osteoporosis type Stable on the Alendronate once a week. Last BMD was in 2021. Continue vitamin D and regular exercise (low impact). - Comprehensive metabolic panel - VITAMIN D 25 Hydroxy (Vit-D Deficiency, Fractures)   No follow-ups on file.     I, Danelle Curiale, PA-C, have reviewed all documentation for this visit. The documentation on 01/15/21 for the exam, diagnosis, procedures, and orders are all accurate and complete.    Vernie Murders, PA-C  [employer] [phone] (phone) [phone] (fax)  [place]

## 2021-01-16 DIAGNOSIS — M810 Age-related osteoporosis without current pathological fracture: Secondary | ICD-10-CM | POA: Diagnosis not present

## 2021-01-16 DIAGNOSIS — I059 Rheumatic mitral valve disease, unspecified: Secondary | ICD-10-CM | POA: Diagnosis not present

## 2021-01-16 DIAGNOSIS — E559 Vitamin D deficiency, unspecified: Secondary | ICD-10-CM | POA: Diagnosis not present

## 2021-01-16 DIAGNOSIS — E7800 Pure hypercholesterolemia, unspecified: Secondary | ICD-10-CM | POA: Diagnosis not present

## 2021-01-16 DIAGNOSIS — Z0000 Encounter for general adult medical examination without abnormal findings: Secondary | ICD-10-CM | POA: Diagnosis not present

## 2021-01-17 ENCOUNTER — Telehealth: Payer: Self-pay

## 2021-01-17 LAB — TSH: TSH: 1.55 u[IU]/mL (ref 0.450–4.500)

## 2021-01-17 LAB — CBC WITH DIFFERENTIAL/PLATELET
Basophils Absolute: 0 10*3/uL (ref 0.0–0.2)
Basos: 1 %
EOS (ABSOLUTE): 0.2 10*3/uL (ref 0.0–0.4)
Eos: 5 %
Hematocrit: 37.7 % (ref 34.0–46.6)
Hemoglobin: 11.8 g/dL (ref 11.1–15.9)
Immature Grans (Abs): 0 10*3/uL (ref 0.0–0.1)
Immature Granulocytes: 1 %
Lymphocytes Absolute: 1.2 10*3/uL (ref 0.7–3.1)
Lymphs: 28 %
MCH: 27.5 pg (ref 26.6–33.0)
MCHC: 31.3 g/dL — ABNORMAL LOW (ref 31.5–35.7)
MCV: 88 fL (ref 79–97)
Monocytes Absolute: 0.3 10*3/uL (ref 0.1–0.9)
Monocytes: 8 %
Neutrophils Absolute: 2.6 10*3/uL (ref 1.4–7.0)
Neutrophils: 57 %
Platelets: 204 10*3/uL (ref 150–450)
RBC: 4.29 x10E6/uL (ref 3.77–5.28)
RDW: 13.3 % (ref 11.7–15.4)
WBC: 4.4 10*3/uL (ref 3.4–10.8)

## 2021-01-17 LAB — LIPID PANEL
Chol/HDL Ratio: 2.2 ratio (ref 0.0–4.4)
Cholesterol, Total: 149 mg/dL (ref 100–199)
HDL: 69 mg/dL (ref >39)
LDL Chol Calc (NIH): 64 mg/dL (ref 0–99)
Triglycerides: 82 mg/dL (ref 0–149)
VLDL Cholesterol Cal: 16 mg/dL (ref 5–40)

## 2021-01-17 LAB — COMPREHENSIVE METABOLIC PANEL
ALT: 13 IU/L (ref 0–32)
AST: 17 IU/L (ref 0–40)
Albumin/Globulin Ratio: 1.6 (ref 1.2–2.2)
Albumin: 4.2 g/dL (ref 3.7–4.7)
Alkaline Phosphatase: 62 IU/L (ref 44–121)
BUN/Creatinine Ratio: 19 (ref 12–28)
BUN: 17 mg/dL (ref 8–27)
Bilirubin Total: 0.3 mg/dL (ref 0.0–1.2)
CO2: 24 mmol/L (ref 20–29)
Calcium: 9.4 mg/dL (ref 8.7–10.3)
Chloride: 105 mmol/L (ref 96–106)
Creatinine, Ser: 0.89 mg/dL (ref 0.57–1.00)
Globulin, Total: 2.7 g/dL (ref 1.5–4.5)
Glucose: 90 mg/dL (ref 65–99)
Potassium: 3.9 mmol/L (ref 3.5–5.2)
Sodium: 142 mmol/L (ref 134–144)
Total Protein: 6.9 g/dL (ref 6.0–8.5)
eGFR: 69 mL/min/{1.73_m2} (ref >59)

## 2021-01-17 LAB — VITAMIN D 25 HYDROXY (VIT D DEFICIENCY, FRACTURES): Vit D, 25-Hydroxy: 65.8 ng/mL (ref 30.0–100.0)

## 2021-01-17 NOTE — Telephone Encounter (Signed)
Left message advising pt. ([name])  PEC please schedule pt for a six month follow up when she calls back.   Thanks,   -Mickel Baas

## 2021-01-17 NOTE — Telephone Encounter (Signed)
-----   Message from Margo Common, PA-C sent at 01/17/2021 11:57 AM EDT ----- All blood tests in very good shape. Continue present medications and recheck in 6 months.

## 2021-01-22 ENCOUNTER — Telehealth: Payer: Self-pay | Admitting: Cardiology

## 2021-01-22 NOTE — Telephone Encounter (Signed)
No indication for endocarditis prophylaxis based on current guidelines.

## 2021-01-22 NOTE — Telephone Encounter (Signed)
Spoke to patient she wanted to if any antibiotic is needed for possible root canal. She states she has an appointment on February 27 2021. The dentist has not required, the patient was questioning the needed. per patient, there was change in last echo reprt.   aware will deerfr to Dr Percival Spanish

## 2021-01-22 NOTE — Telephone Encounter (Signed)
Patient would like to know if she needs to take antibiotics prior to dentist appointments due to her micro valve regurgitation. She states her dentist office is not requiring clearance.

## 2021-01-23 NOTE — Telephone Encounter (Signed)
Advised patient, verbalized understanding  

## 2021-04-01 ENCOUNTER — Ambulatory Visit
Admission: RE | Admit: 2021-04-01 | Discharge: 2021-04-01 | Disposition: A | Discharge status: Home / Self Care | Payer: Medicare HMO | Source: Ambulatory Visit | Attending: Pulmonary Disease | Admitting: Pulmonary Disease

## 2021-04-01 ENCOUNTER — Other Ambulatory Visit: Payer: Self-pay

## 2021-04-01 DIAGNOSIS — R911 Solitary pulmonary nodule: Secondary | ICD-10-CM | POA: Diagnosis not present

## 2021-04-01 DIAGNOSIS — R918 Other nonspecific abnormal finding of lung field: Secondary | ICD-10-CM | POA: Insufficient documentation

## 2021-04-02 ENCOUNTER — Other Ambulatory Visit: Payer: Self-pay

## 2021-04-02 DIAGNOSIS — R918 Other nonspecific abnormal finding of lung field: Secondary | ICD-10-CM

## 2021-04-04 ENCOUNTER — Other Ambulatory Visit: Payer: Self-pay

## 2021-04-04 ENCOUNTER — Ambulatory Visit: Payer: Medicare HMO | Admitting: Pulmonary Disease

## 2021-04-04 ENCOUNTER — Encounter: Payer: Self-pay | Admitting: Pulmonary Disease

## 2021-04-04 VITALS — BP 118/62 | HR 60 | Temp 97.6°F | Ht 62.5 in | Wt 119.2 lb

## 2021-04-04 DIAGNOSIS — R911 Solitary pulmonary nodule: Secondary | ICD-10-CM | POA: Diagnosis not present

## 2021-04-04 DIAGNOSIS — K219 Gastro-esophageal reflux disease without esophagitis: Secondary | ICD-10-CM

## 2021-04-04 NOTE — Progress Notes (Signed)
Subjective:    Patient ID: Kristina Jordan, female    DOB: 1951-05-02, 70 y.o.   MRN: [id number] Chief Complaint  Patient presents with   Follow-up    Coughing, nasal congestion.    HPI Kristina Jordan is a 70 year old lifelong never smoker who presents for follow-up of pulmonary nodules incidentally and initially found on a CT scan of the chest performed on 31 July 2020.  This is a scheduled visit.  Patient has not noticed any shortness of breath wheezing or sputum production.  She has had a mild nonproductive cough associated with nasal congestion after the seasonal change.  She has not had any hemoptysis. She has not had any fevers, chills or sweats.  No chest pain since her initial issue noted in November.  She has not had any weight loss or anorexia.  Use with GERD.  Of note she is on Fosamax.  She had a follow-up chest CT performed on 18 July that shows a stable 7 x 4 x 5 mm anterior right lower lobe lung nodule consistent with benign etiology a prior groundglass nodule noted has now completely resolved.  Review of Systems A 10 point review of systems was performed and it is as noted above otherwise negative.  Patient Active Problem List   Diagnosis Date Noted   Idiopathic hypotension 11/08/2020   Dilated cardiomyopathy ([place]) 11/08/2020   Ganglion cyst of flexor tendon sheath of finger 12/30/2016   Heme + stool    Hypercholesterolemia without hypertriglyceridemia 11/19/2015   Avitaminosis D 11/19/2015   Atypical chest pain 09/09/2011   Non-ST elevation MI (NSTEMI) ([place]) 09/09/2011   Brachial neuritis 04/16/2010   MITRAL INSUFFICIENCY 12/26/2009   Mitral valve disorder 12/24/2009   FIBRILLATION, ATRIAL 12/24/2009   Heart disease 11/01/2009   Leg varices 03/19/2009   Acquired trigger finger 01/02/2009   Need for prophylactic hormone replacement therapy (postmenopausal) 10/09/2006   OP (osteoporosis) 10/09/2006   Social History   Tobacco Use   Smoking status: Former     Years: 0.00    Types: Cigarettes   Smokeless tobacco: Never   Tobacco comments:    only socially in high school.   Substance Use Topics   Alcohol use: No   Allergies  Allergen Reactions   Sulfonamide Derivatives Rash   Current Meds  Medication Sig   alendronate (FOSAMAX) 70 MG tablet TAKE 1 TABLET BY MOUTH ONCE A WEEK WITH  A  FULL  GLASS  OF  WATER  ON  AN  EMPTY  STOMACH.  DO  NOT  LIE  DOWN  UNTIL  AFTER  BREAKFAST   aspirin 81 MG tablet Take 81 mg by mouth daily.   Cholecalciferol (VITAMIN D-3) 1000 UNITS CAPS Take 4,000 Units by mouth daily.    diltiazem (CARDIZEM) 120 MG tablet Take 1 tablet (120 mg total) by mouth daily.   propranolol (INDERAL) 40 MG tablet Take 1 tablet (40 mg total) by mouth 2 (two) times daily.   Immunization History  Administered Date(s) Administered   Fluad Quad(high Dose 65+) 06/29/2019, 07/20/2020   H1N1 10/12/2008   Influenza, High Dose Seasonal PF 07/31/2014, 08/02/2015, 07/18/2016, 08/04/2017, 07/14/2018   Pneumococcal Conjugate-13 11/17/2014   Pneumococcal Polysaccharide-23 06/02/2011, 11/07/2016   Tdap 10/21/2010   Zoster, Live 10/27/2011       Objective:   Physical Exam BP 118/62 (BP Location: Left Arm, Patient Position: Sitting, Cuff Size: Normal)   Pulse 60   Temp 97.6 F (36.4 C) (Oral)   Ht 5'  2.5" (1.588 m)   Wt 119 lb 3.2 oz (54.1 kg)   SpO2 100%   BMI 21.45 kg/m  GENERAL: Slender, well-developed woman in no acute respiratory distress.  Fully ambulatory. HEAD: Normocephalic, atraumatic. EYES: Pupils equal, round, reactive to light.  No scleral icterus. MOUTH: Nose/mouth/throat not examined due to masking requirements for COVID 19. NECK: Supple. No thyromegaly. Trachea midline. No JVD.  No adenopathy. PULMONARY: Good air entry bilaterally.  No adventitious sounds. CARDIOVASCULAR: S1 and S2. Regular rate and rhythm.  Possible midsystolic click, no other murmurs or extra heart sounds. ABDOMEN: Benign. MUSCULOSKELETAL: No joint  deformity, no clubbing, no edema. NEUROLOGIC: No focal deficits, no gait disturbance, speech is fluent. SKIN: Intact,warm,dry. PSYCH: Mood and behavior normal.  Representative slice of the CT performed 01 April 2021:  No change on the previously noted nodule    Assessment & Plan:     ICD-10-CM   1. Lung nodule  R91.1    Continue surveillance, CT in a year If CT is stable in a year no further imaging will be necessary    2. Gastroesophageal reflux disease, unspecified whether esophagitis present  K21.9    This issue adds complexity to her management She is on Fosamax which could be aggravating this issues     CT chest ordered for a years time.  We will see the patient in follow-up prior to that should any new respiratory difficulties arise.  Renold Don, MD [facility]   *This note was dictated using voice recognition software/Dragon.  Despite best efforts to proofread, errors can occur which can change the meaning.  Any change was purely unintentional.

## 2021-04-04 NOTE — Patient Instructions (Signed)
We are going to schedule a CT for follow-up in a year's time we will see you in follow-up in a years time.

## 2021-04-05 ENCOUNTER — Encounter: Payer: Self-pay | Admitting: Pulmonary Disease

## 2021-04-25 ENCOUNTER — Ambulatory Visit
Admission: RE | Admit: 2021-04-25 | Discharge: 2021-04-25 | Disposition: A | Discharge status: Home / Self Care | Payer: Medicare HMO | Source: Ambulatory Visit | Attending: Family Medicine | Admitting: Family Medicine

## 2021-04-25 ENCOUNTER — Other Ambulatory Visit: Payer: Self-pay

## 2021-04-25 DIAGNOSIS — Z1231 Encounter for screening mammogram for malignant neoplasm of breast: Secondary | ICD-10-CM | POA: Diagnosis not present

## 2021-06-02 NOTE — Progress Notes (Signed)
Cardiology Office Note   Date:  06/04/2021   ID:  Kristina Jordan, DOB [birth year], MRN [id number]  PCP:  Margo Common, PA-C  Cardiologist:   Minus Breeding, MD   Chief Complaint  Patient presents with   Mitral Valve Prolapse       History of Present Illness: Kristina Jordan is a 70 y.o. 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   A cardiac catheterization demonstrated normal coronary arteries. Her ejection fraction was low normal at that time although it was normal on the echo in 2021.  She had mild MR.   There was moderate late systolic prolapse of the middle scallop of the posterior mitral leaflet.  There was a questionable septal aneurysm vs septal mass.    She feels well.  The patient denies any new symptoms such as chest discomfort, neck or arm discomfort. There has been no new shortness of breath, PND or orthopnea. There have been no reported palpitations, presyncope or syncope.    Past Medical History:  Diagnosis Date   Hyperlipidemia    MITRAL INSUFFICIENCY    MITRAL VALVE PROLAPSE     Past Surgical History:  Procedure Laterality Date   ABDOMINAL HYSTERECTOMY     BREAST BIOPSY Left    stereo- neg   BREAST EXCISIONAL BIOPSY Left    neg   CERVICAL SPINE SURGERY     COLONOSCOPY WITH PROPOFOL N/A 12/02/2016   Procedure: COLONOSCOPY WITH PROPOFOL;  Surgeon: [name], MD;  Location: [facility] ENDOSCOPY;  Service: Endoscopy;  Laterality: N/A;   LEFT HEART CATHETERIZATION WITH CORONARY ANGIOGRAM N/A 09/10/2011   Procedure: LEFT HEART CATHETERIZATION WITH CORONARY ANGIOGRAM;  Surgeon: Wellington Hampshire, MD;  Location: [place] [facility];  Service: Cardiovascular;  Laterality: N/A;     Current Outpatient Medications  Medication Sig Dispense Refill   alendronate (FOSAMAX) 70 MG tablet TAKE 1 TABLET BY MOUTH ONCE A WEEK WITH  A  FULL  GLASS  OF  WATER  ON  AN  EMPTY  STOMACH.  DO  NOT  LIE  DOWN  UNTIL  AFTER  BREAKFAST 12 tablet 3    aspirin 81 MG tablet Take 81 mg by mouth daily.     Cholecalciferol (VITAMIN D-3) 1000 UNITS CAPS Take 4,000 Units by mouth daily.      diltiazem (CARDIZEM) 120 MG tablet Take 1 tablet (120 mg total) by mouth daily. 90 tablet 3   propranolol (INDERAL) 40 MG tablet Take 1 tablet (40 mg total) by mouth 2 (two) times daily. 180 tablet 3   No current facility-administered medications for this visit.    Allergies:   Sulfonamide derivatives    ROS:  Please see the history of present illness.   Otherwise, review of systems are positive for None.   All other systems are reviewed and negative.    PHYSICAL EXAM: VS:  BP (!) 118/52   Pulse (!) 56   Ht '5\' 5"'$  (1.651 m)   Wt 117 lb 9.6 oz (53.3 kg)   SpO2 99%   BMI 19.57 kg/m  , BMI Body mass index is 19.57 kg/m. GENERAL:  Well appearing NECK:  No jugular venous distention, waveform within normal limits, carotid upstroke brisk and symmetric, no bruits, no thyromegaly LUNGS:  Clear to auscultation bilaterally CHEST:  Unremarkable HEART:  PMI not displaced or sustained,S1 and S2 within normal limits, no S3, no S4, positive mitral valve prolapse clicks, no rubs, no murmurs ABD:  Flat, positive bowel sounds normal in frequency in pitch, no bruits, no rebound, no guarding, no midline pulsatile mass, no hepatomegaly, no splenomegaly EXT:  2 plus pulses throughout, no edema, no cyanosis no clubbing   EKG:  EKG is   ordered today. The ekg ordered today demonstrates sinus rhythm, rate 56, axis within normal limits, intervals within normal limits, no acute changes.   Recent Labs: 01/16/2021: ALT 13; BUN 17; Creatinine, Ser 0.89; Hemoglobin 11.8; Platelets 204; Potassium 3.9; Sodium 142; TSH 1.550    Lipid Panel    Component Value Date/Time   CHOL 149 01/16/2021 0933   TRIG 82 01/16/2021 0933   HDL 69 01/16/2021 0933   CHOLHDL 2.2 01/16/2021 0933   CHOLHDL 2.5 09/09/2011 1646   VLDL 29 09/09/2011 1646   LDLCALC 64 01/16/2021 0933      Wt  Readings from Last 3 Encounters:  06/04/21 117 lb 9.6 oz (53.3 kg)  04/04/21 119 lb 3.2 oz (54.1 kg)  01/15/21 117 lb (53.1 kg)      Other studies Reviewed: Additional studies/ records that were reviewed today include:  None. Review of the above records demonstrates:  NA   ASSESSMENT AND PLAN:  MITRAL VALVE PROLAPSE -  She has mitral valve prolapse with a posteriorly directed jet.  I would like to further evaluate this with a TEE to make sure were not missing for more significant mitral regurgitation which is currently listed as moderate.  I discussed this with her.  She has no contraindications.  CARDIOMYOPATHY Her EF was normal.    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    Current medicines are reviewed at length with the patient today.  The patient does not have concerns regarding medicines.  The following changes have been made:   None  Labs/ tests ordered today include:    No orders of the defined types were placed in this encounter.    Disposition:   FU with me in 12 months pending the results of the TEE.      Signed, Minus Breeding, MD  06/04/2021 1:42 PM    [facility]

## 2021-06-02 NOTE — H&P (View-Only) (Signed)
Cardiology Office Note   Date:  06/04/2021   ID:  Kristina Jordan, DOB 09/05/1951, MRN [id number]  PCP:  Margo Common, PA-C  Cardiologist:   Minus Breeding, MD   Chief Complaint  Patient presents with   Mitral Valve Prolapse       History of Present Illness: Kristina Jordan is a 70 y.o. 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   A cardiac catheterization demonstrated normal coronary arteries. Her ejection fraction was low normal at that time although it was normal on the echo in 2021.  She had mild MR.   There was moderate late systolic prolapse of the middle scallop of the posterior mitral leaflet.  There was a questionable septal aneurysm vs septal mass.    She feels well.  The patient denies any new symptoms such as chest discomfort, neck or arm discomfort. There has been no new shortness of breath, PND or orthopnea. There have been no reported palpitations, presyncope or syncope.    Past Medical History:  Diagnosis Date   Hyperlipidemia    MITRAL INSUFFICIENCY    MITRAL VALVE PROLAPSE     Past Surgical History:  Procedure Laterality Date   ABDOMINAL HYSTERECTOMY     BREAST BIOPSY Left    stereo- neg   BREAST EXCISIONAL BIOPSY Left    neg   CERVICAL SPINE SURGERY     COLONOSCOPY WITH PROPOFOL N/A 12/02/2016   Procedure: COLONOSCOPY WITH PROPOFOL;  Surgeon: [name], MD;  Location: [facility] ENDOSCOPY;  Service: Endoscopy;  Laterality: N/A;   LEFT HEART CATHETERIZATION WITH CORONARY ANGIOGRAM N/A 09/10/2011   Procedure: LEFT HEART CATHETERIZATION WITH CORONARY ANGIOGRAM;  Surgeon: Wellington Hampshire, MD;  Location: [facility];  Service: Cardiovascular;  Laterality: N/A;     Current Outpatient Medications  Medication Sig Dispense Refill   alendronate (FOSAMAX) 70 MG tablet TAKE 1 TABLET BY MOUTH ONCE A WEEK WITH  A  FULL  GLASS  OF  WATER  ON  AN  EMPTY  STOMACH.  DO  NOT  LIE  DOWN  UNTIL  AFTER  BREAKFAST 12 tablet 3    aspirin 81 MG tablet Take 81 mg by mouth daily.     Cholecalciferol (VITAMIN D-3) 1000 UNITS CAPS Take 4,000 Units by mouth daily.      diltiazem (CARDIZEM) 120 MG tablet Take 1 tablet (120 mg total) by mouth daily. 90 tablet 3   propranolol (INDERAL) 40 MG tablet Take 1 tablet (40 mg total) by mouth 2 (two) times daily. 180 tablet 3   No current facility-administered medications for this visit.    Allergies:   Sulfonamide derivatives    ROS:  Please see the history of present illness.   Otherwise, review of systems are positive for None.   All other systems are reviewed and negative.    PHYSICAL EXAM: VS:  BP (!) 118/52   Pulse (!) 56   Ht '5\' 5"'$  (1.651 m)   Wt 117 lb 9.6 oz (53.3 kg)   SpO2 99%   BMI 19.57 kg/m  , BMI Body mass index is 19.57 kg/m. GENERAL:  Well appearing NECK:  No jugular venous distention, waveform within normal limits, carotid upstroke brisk and symmetric, no bruits, no thyromegaly LUNGS:  Clear to auscultation bilaterally CHEST:  Unremarkable HEART:  PMI not displaced or sustained,S1 and S2 within normal limits, no S3, no S4, positive mitral valve prolapse clicks, no rubs, no murmurs ABD:  Flat, positive bowel sounds normal in frequency in pitch, no bruits, no rebound, no guarding, no midline pulsatile mass, no hepatomegaly, no splenomegaly EXT:  2 plus pulses throughout, no edema, no cyanosis no clubbing   EKG:  EKG is   ordered today. The ekg ordered today demonstrates sinus rhythm, rate 56, axis within normal limits, intervals within normal limits, no acute changes.   Recent Labs: 01/16/2021: ALT 13; BUN 17; Creatinine, Ser 0.89; Hemoglobin 11.8; Platelets 204; Potassium 3.9; Sodium 142; TSH 1.550    Lipid Panel    Component Value Date/Time   CHOL 149 01/16/2021 0933   TRIG 82 01/16/2021 0933   HDL 69 01/16/2021 0933   CHOLHDL 2.2 01/16/2021 0933   CHOLHDL 2.5 09/09/2011 1646   VLDL 29 09/09/2011 1646   LDLCALC 64 01/16/2021 0933      Wt  Readings from Last 3 Encounters:  06/04/21 117 lb 9.6 oz (53.3 kg)  04/04/21 119 lb 3.2 oz (54.1 kg)  01/15/21 117 lb (53.1 kg)      Other studies Reviewed: Additional studies/ records that were reviewed today include:  None. Review of the above records demonstrates:  NA   ASSESSMENT AND PLAN:  MITRAL VALVE PROLAPSE -  She has mitral valve prolapse with a posteriorly directed jet.  I would like to further evaluate this with a TEE to make sure were not missing for more significant mitral regurgitation which is currently listed as moderate.  I discussed this with her.  She has no contraindications.  CARDIOMYOPATHY Her EF was normal.    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    Current medicines are reviewed at length with the patient today.  The patient does not have concerns regarding medicines.  The following changes have been made:   None  Labs/ tests ordered today include:    No orders of the defined types were placed in this encounter.    Disposition:   FU with me in 12 months pending the results of the TEE.      Signed, Minus Breeding, MD  06/04/2021 1:42 PM    [facility]

## 2021-06-04 ENCOUNTER — Other Ambulatory Visit: Payer: Self-pay

## 2021-06-04 ENCOUNTER — Encounter: Payer: Self-pay | Admitting: Cardiology

## 2021-06-04 ENCOUNTER — Ambulatory Visit: Payer: Medicare HMO | Admitting: Cardiology

## 2021-06-04 VITALS — BP 118/52 | HR 56 | Ht 65.0 in | Wt 117.6 lb

## 2021-06-04 DIAGNOSIS — I253 Aneurysm of heart: Secondary | ICD-10-CM

## 2021-06-04 DIAGNOSIS — I341 Nonrheumatic mitral (valve) prolapse: Secondary | ICD-10-CM

## 2021-06-04 NOTE — Patient Instructions (Addendum)
  You are scheduled for a TEE on Thursday, October 13th with Dr. Margaretann Loveless.  Please arrive at the [facility] (Main Entrance A) at [facility]: [address] [place], [place] [address] at 6:30 AM.   DIET: Nothing to eat or drink after midnight except a sip of water with medications (see medication instructions below)  FYI: For your safety, and to allow [place] to monitor your vital signs accurately during the surgery/procedure we request that   if you have artificial nails, gel coating, SNS etc. Please have those removed prior to your surgery/procedure. Not having the nail coverings /polish removed may result in cancellation or delay of your surgery/procedure.   Medication Instructions: Take medications as you normally would.  Labs: Please return for labs 1 week prior to procedure (BMET, CBC)  Our in office lab hours are Monday-Friday 8:00-4:00, closed for lunch 12:45-1:45 pm.  No appointment needed.  You must have a responsible person to drive you home and stay in the waiting area during your procedure. Failure to do so could result in cancellation.  Bring your insurance cards.  *Special Note: Every effort is made to have your procedure done on time. Occasionally there are emergencies that occur at the hospital that may cause delays. Please be patient if a delay does occur.     Follow up in 12 months with Dr. Percival Spanish

## 2021-06-10 ENCOUNTER — Other Ambulatory Visit: Payer: Self-pay | Admitting: *Deleted

## 2021-06-10 DIAGNOSIS — I253 Aneurysm of heart: Secondary | ICD-10-CM

## 2021-06-10 DIAGNOSIS — I341 Nonrheumatic mitral (valve) prolapse: Secondary | ICD-10-CM

## 2021-06-18 ENCOUNTER — Encounter (HOSPITAL_COMMUNITY): Payer: Self-pay | Admitting: Internal Medicine

## 2021-06-18 DIAGNOSIS — Z7982 Long term (current) use of aspirin: Secondary | ICD-10-CM | POA: Diagnosis not present

## 2021-06-18 DIAGNOSIS — I2510 Atherosclerotic heart disease of native coronary artery without angina pectoris: Secondary | ICD-10-CM | POA: Diagnosis not present

## 2021-06-18 DIAGNOSIS — I739 Peripheral vascular disease, unspecified: Secondary | ICD-10-CM | POA: Diagnosis not present

## 2021-06-18 DIAGNOSIS — I4891 Unspecified atrial fibrillation: Secondary | ICD-10-CM | POA: Diagnosis not present

## 2021-06-18 DIAGNOSIS — M1990 Unspecified osteoarthritis, unspecified site: Secondary | ICD-10-CM | POA: Diagnosis not present

## 2021-06-18 DIAGNOSIS — Z008 Encounter for other general examination: Secondary | ICD-10-CM | POA: Diagnosis not present

## 2021-06-18 DIAGNOSIS — R32 Unspecified urinary incontinence: Secondary | ICD-10-CM | POA: Diagnosis not present

## 2021-06-18 DIAGNOSIS — I252 Old myocardial infarction: Secondary | ICD-10-CM | POA: Diagnosis not present

## 2021-06-18 DIAGNOSIS — D6869 Other thrombophilia: Secondary | ICD-10-CM | POA: Diagnosis not present

## 2021-06-18 DIAGNOSIS — Z7722 Contact with and (suspected) exposure to environmental tobacco smoke (acute) (chronic): Secondary | ICD-10-CM | POA: Diagnosis not present

## 2021-06-18 DIAGNOSIS — I10 Essential (primary) hypertension: Secondary | ICD-10-CM | POA: Diagnosis not present

## 2021-06-20 DIAGNOSIS — I253 Aneurysm of heart: Secondary | ICD-10-CM | POA: Diagnosis not present

## 2021-06-20 DIAGNOSIS — I341 Nonrheumatic mitral (valve) prolapse: Secondary | ICD-10-CM | POA: Diagnosis not present

## 2021-06-20 LAB — CBC
Hematocrit: 37.6 % (ref 34.0–46.6)
Hemoglobin: 12.3 g/dL (ref 11.1–15.9)
MCH: 27.7 pg (ref 26.6–33.0)
MCHC: 32.7 g/dL (ref 31.5–35.7)
MCV: 85 fL (ref 79–97)
Platelets: 220 10*3/uL (ref 150–450)
RBC: 4.44 x10E6/uL (ref 3.77–5.28)
RDW: 12.6 % (ref 11.7–15.4)
WBC: 4.9 10*3/uL (ref 3.4–10.8)

## 2021-06-20 LAB — BASIC METABOLIC PANEL
BUN/Creatinine Ratio: 21 (ref 12–28)
BUN: 19 mg/dL (ref 8–27)
CO2: 25 mmol/L (ref 20–29)
Calcium: 9.6 mg/dL (ref 8.7–10.3)
Chloride: 104 mmol/L (ref 96–106)
Creatinine, Ser: 0.92 mg/dL (ref 0.57–1.00)
Glucose: 85 mg/dL (ref 70–99)
Potassium: 4 mmol/L (ref 3.5–5.2)
Sodium: 142 mmol/L (ref 134–144)
eGFR: 66 mL/min/{1.73_m2} (ref >59)

## 2021-06-27 ENCOUNTER — Other Ambulatory Visit: Payer: Self-pay

## 2021-06-27 ENCOUNTER — Ambulatory Visit (HOSPITAL_COMMUNITY): Payer: Medicare HMO | Admitting: Certified Registered"

## 2021-06-27 ENCOUNTER — Encounter (HOSPITAL_COMMUNITY): Payer: Self-pay | Admitting: Internal Medicine

## 2021-06-27 ENCOUNTER — Ambulatory Visit (HOSPITAL_COMMUNITY)
Admission: RE | Admit: 2021-06-27 | Discharge: 2021-06-27 | Disposition: A | Discharge status: Home / Self Care | Payer: Medicare HMO | Attending: Internal Medicine | Admitting: Internal Medicine

## 2021-06-27 ENCOUNTER — Ambulatory Visit (HOSPITAL_BASED_OUTPATIENT_CLINIC_OR_DEPARTMENT_OTHER)
Admission: RE | Admit: 2021-06-27 | Discharge: 2021-06-27 | Disposition: A | Discharge status: Home / Self Care | Payer: Medicare HMO | Source: Home / Self Care | Attending: Internal Medicine | Admitting: Internal Medicine

## 2021-06-27 ENCOUNTER — Encounter (HOSPITAL_COMMUNITY)
Admission: RE | Disposition: A | Discharge status: Home / Self Care | Payer: Self-pay | Source: Home / Self Care | Attending: Internal Medicine

## 2021-06-27 DIAGNOSIS — Q211 Atrial septal defect, unspecified: Secondary | ICD-10-CM | POA: Diagnosis not present

## 2021-06-27 DIAGNOSIS — I253 Aneurysm of heart: Secondary | ICD-10-CM

## 2021-06-27 DIAGNOSIS — Z79899 Other long term (current) drug therapy: Secondary | ICD-10-CM | POA: Diagnosis not present

## 2021-06-27 DIAGNOSIS — I340 Nonrheumatic mitral (valve) insufficiency: Secondary | ICD-10-CM

## 2021-06-27 DIAGNOSIS — Q2112 Patent foramen ovale: Secondary | ICD-10-CM | POA: Insufficient documentation

## 2021-06-27 DIAGNOSIS — I341 Nonrheumatic mitral (valve) prolapse: Secondary | ICD-10-CM | POA: Diagnosis not present

## 2021-06-27 DIAGNOSIS — Z882 Allergy status to sulfonamides status: Secondary | ICD-10-CM | POA: Insufficient documentation

## 2021-06-27 DIAGNOSIS — Z7982 Long term (current) use of aspirin: Secondary | ICD-10-CM | POA: Diagnosis not present

## 2021-06-27 DIAGNOSIS — I080 Rheumatic disorders of both mitral and aortic valves: Secondary | ICD-10-CM | POA: Diagnosis not present

## 2021-06-27 HISTORY — PX: BUBBLE STUDY: SHX6837

## 2021-06-27 HISTORY — PX: TEE WITHOUT CARDIOVERSION: SHX5443

## 2021-06-27 SURGERY — ECHOCARDIOGRAM, TRANSESOPHAGEAL
Anesthesia: Monitor Anesthesia Care

## 2021-06-27 MED ORDER — LIDOCAINE 2% (20 MG/ML) 5 ML SYRINGE
INTRAMUSCULAR | Status: DC | PRN
  Administered 2021-06-27: 20 mg via INTRAVENOUS

## 2021-06-27 MED ORDER — BUTAMBEN-TETRACAINE-BENZOCAINE 2-2-14 % EX AERO
INHALATION_SPRAY | CUTANEOUS | Status: DC | PRN
  Administered 2021-06-27: 2 via TOPICAL

## 2021-06-27 MED ORDER — PROPOFOL 500 MG/50ML IV EMUL
INTRAVENOUS | Status: DC | PRN
  Administered 2021-06-27: 150 ug/kg/min via INTRAVENOUS

## 2021-06-27 MED ORDER — SODIUM CHLORIDE 0.9 % IV SOLN: INTRAVENOUS | Status: DC

## 2021-06-27 NOTE — Interval H&P Note (Signed)
History and Physical Interval Note:  06/27/2021 7:35 AM  Kristina Jordan  has presented today for surgery, with the diagnosis of mitral valve prolapse.  The various methods of treatment have been discussed with the patient and family. After consideration of risks, benefits and other options for treatment, the patient has consented to  Procedure(s): TRANSESOPHAGEAL ECHOCARDIOGRAM (TEE) (N/A) as a surgical intervention.  The patient's history has been reviewed, patient examined, no change in status, stable for surgery.  I have reviewed the patient's chart and labs.  Questions were answered to the patient's satisfaction.     Elouise Munroe

## 2021-06-27 NOTE — Progress Notes (Signed)
Echocardiogram Echocardiogram Transesophageal has been performed.  Oneal Deputy Azel Gumina RDCS 06/27/2021, 9:05 AM

## 2021-06-27 NOTE — Anesthesia Preprocedure Evaluation (Signed)
Anesthesia Evaluation  Patient identified by MRN, date of birth, ID band Patient awake    Reviewed: Allergy & Precautions, NPO status , Patient's Chart, lab work & pertinent test results  History of Anesthesia Complications (+) PONV and history of anesthetic complications  Airway Mallampati: II  TM Distance: >3 FB Neck ROM: Full    Dental  (+) Teeth Intact, Dental Advisory Given, Missing,    Pulmonary neg shortness of breath, neg sleep apnea, neg COPD, neg recent URI, former smoker,    breath sounds clear to auscultation       Cardiovascular (-) hypertension(-) angina+ Past MI  (-) CHF + Valvular Problems/Murmurs  Rhythm:Regular  1. Left ventricular ejection fraction, by estimation, is 60 to 65%. The  left ventricle has normal function. The left ventricle has no regional  wall motion abnormalities. Left ventricular diastolic parameters are  consistent with Grade I diastolic  dysfunction (impaired relaxation).  2. Right ventricular systolic function is normal. The right ventricular  size is normal. There is normal pulmonary artery systolic pressure.  3. Left atrial size was moderately dilated.  4. 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  5. The mitral valve is myxomatous. Mild mitral valve regurgitation. There  is moderate late systolic prolapse of the middle scallop of the posterior  leaflet of the mitral valve.  6. The aortic valve is tricuspid. Aortic valve regurgitation is not  visualized.  7. The inferior vena cava is normal in size with greater than 50%  respiratory variability, suggesting right atrial pressure of 3 mmHg.    Neuro/Psych negative neurological ROS  negative psych ROS   GI/Hepatic negative GI ROS, Neg liver ROS,   Endo/Other  negative  endocrine ROS  Renal/GU negative Renal ROSLab Results      Component                Value               Date                      CREATININE               0.92                06/20/2021                Musculoskeletal negative musculoskeletal ROS (+)   Abdominal   Peds negative pediatric ROS (+)  Hematology negative hematology ROS (+) Lab Results      Component                Value               Date                      WBC                      4.9                 06/20/2021                HGB                      12.3  06/20/2021                HCT                      37.6                06/20/2021                MCV                      85                  06/20/2021                PLT                      220                 06/20/2021              Anesthesia Other Findings   Reproductive/Obstetrics                             Anesthesia Physical Anesthesia Plan  ASA: 2  Anesthesia Plan: MAC   Post-op Pain Management:    Induction: Intravenous  PONV Risk Score and Plan: 3 and Propofol infusion and Treatment may vary due to age or medical condition  Airway Management Planned: Nasal Cannula  Additional Equipment: None  Intra-op Plan:   Post-operative Plan:   Informed Consent: I have reviewed the patients History and Physical, chart, labs and discussed the procedure including the risks, benefits and alternatives for the proposed anesthesia with the patient or authorized representative who has indicated his/her understanding and acceptance.     Dental advisory given  Plan Discussed with: CRNA and Anesthesiologist  Anesthesia Plan Comments:         Anesthesia Quick Evaluation

## 2021-06-27 NOTE — CV Procedure (Signed)
INDICATIONS: MV prolapse, atrial septal aneurysm  PROCEDURE:   Informed consent was obtained prior to the procedure. The risks, benefits and alternatives for the procedure were discussed and the patient comprehended these risks.  Risks include, but are not limited to, cough, sore throat, vomiting, nausea, somnolence, esophageal and stomach trauma or perforation, bleeding, low blood pressure, aspiration, pneumonia, infection, trauma to the teeth and death.    After a procedural time-out, the oropharynx was anesthetized with 20% benzocaine spray.   During this procedure the patient was administered propofol per anesthesia/Julie CRNA.  The patient's heart rate, blood pressure, and oxygen saturation were monitored continuously during the procedure. The period of conscious sedation was 30 minutes, of which I was present face-to-face 100% of this time.  The transesophageal probe was inserted in the esophagus and stomach without difficulty and multiple views were obtained.  The patient was kept under observation until the patient left the procedure room.  The patient left the procedure room in stable condition.   Agitated microbubble saline contrast was administered.  COMPLICATIONS:    There were no immediate complications.  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 There is moderate/moderate-severe mitral valve regurgitation. It is mid to late systolic. Pulmonary vein Doppler demonstrates systolic blunting in the LLPV, no systolic reversals noted.  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 There is rightward bowing of the atrial septum. There is a left to right shunt with no evidence of bidirectional shunt with color flow or agitated saline. Findings consistent with PFO.   RECOMMENDATIONS:    Dismissal home when alert.  Time Spent Directly  with the Patient:  60 minutes   Kristina Jordan 06/27/2021, 8:49 AM

## 2021-06-27 NOTE — Transfer of Care (Signed)
Immediate Anesthesia Transfer of Care Note  Patient: Kristina Jordan  Procedure(s) Performed: TRANSESOPHAGEAL ECHOCARDIOGRAM (TEE) BUBBLE STUDY  Patient Location: PACU  Anesthesia Type:MAC  Level of Consciousness: drowsy  Airway & Oxygen Therapy: Patient Spontanous Breathing and Patient connected to nasal cannula oxygen  Post-op Assessment: Report given to RN and Post -op Vital signs reviewed and stable  Post vital signs: Reviewed and stable  Last Vitals:  Vitals Value Taken Time  BP 109/52 06/27/21 0853  Temp    Pulse 81 06/27/21 0853  Resp 30 06/27/21 0853  SpO2 99 % 06/27/21 0853  Vitals shown include unvalidated device data.  Last Pain:  Vitals:   06/27/21 0713  TempSrc: Oral  PainSc: 0-No pain         Complications: No notable events documented.

## 2021-06-30 ENCOUNTER — Encounter (HOSPITAL_COMMUNITY): Payer: Self-pay | Admitting: Internal Medicine

## 2021-07-02 ENCOUNTER — Encounter (HOSPITAL_COMMUNITY): Payer: Self-pay | Admitting: Internal Medicine

## 2021-07-02 NOTE — Anesthesia Postprocedure Evaluation (Signed)
Anesthesia Post Note  Patient: KEYASIA JOLLIFF  Procedure(s) Performed: TRANSESOPHAGEAL ECHOCARDIOGRAM (TEE) BUBBLE STUDY     Patient location during evaluation: Endoscopy Anesthesia Type: MAC Level of consciousness: awake and alert Pain management: pain level controlled Vital Signs Assessment: post-procedure vital signs reviewed and stable Respiratory status: spontaneous breathing, nonlabored ventilation, respiratory function stable and patient connected to nasal cannula oxygen Cardiovascular status: stable and blood pressure returned to baseline Postop Assessment: no apparent nausea or vomiting Anesthetic complications: no   No notable events documented.  Last Vitals:  Vitals:   06/27/21 0905 06/27/21 0915  BP: 110/65 110/74  Pulse: 72 69  Resp: 17 12  Temp:  36.6 C  SpO2: 99% 99%    Last Pain:  Vitals:   06/27/21 0915  TempSrc:   PainSc: 0-No pain                 Aloise Copus

## 2021-07-04 ENCOUNTER — Telehealth: Payer: Self-pay | Admitting: Cardiology

## 2021-07-04 DIAGNOSIS — I341 Nonrheumatic mitral (valve) prolapse: Secondary | ICD-10-CM

## 2021-07-04 DIAGNOSIS — R0602 Shortness of breath: Secondary | ICD-10-CM

## 2021-07-04 NOTE — Telephone Encounter (Signed)
TEE was done 06/27/21  Patient would like an update on the findings.   Routed to MD/RN

## 2021-07-04 NOTE — Telephone Encounter (Signed)
Patient was calling to see if her results are in from her procedure

## 2021-07-09 NOTE — Telephone Encounter (Signed)
Orders placed.

## 2021-07-22 ENCOUNTER — Other Ambulatory Visit: Payer: Self-pay | Admitting: Family Medicine

## 2021-07-22 ENCOUNTER — Telehealth: Payer: Self-pay

## 2021-07-22 ENCOUNTER — Other Ambulatory Visit: Payer: Self-pay

## 2021-07-22 ENCOUNTER — Ambulatory Visit: Payer: Medicare HMO | Admitting: Family Medicine

## 2021-07-22 ENCOUNTER — Encounter: Payer: Self-pay | Admitting: Family Medicine

## 2021-07-22 ENCOUNTER — Ambulatory Visit (INDEPENDENT_AMBULATORY_CARE_PROVIDER_SITE_OTHER): Payer: Medicare HMO | Admitting: Family Medicine

## 2021-07-22 VITALS — BP 104/60 | HR 60 | Temp 98.0°F | Resp 16 | Wt 114.0 lb

## 2021-07-22 DIAGNOSIS — Z23 Encounter for immunization: Secondary | ICD-10-CM

## 2021-07-22 DIAGNOSIS — I420 Dilated cardiomyopathy: Secondary | ICD-10-CM | POA: Diagnosis not present

## 2021-07-22 DIAGNOSIS — I482 Chronic atrial fibrillation, unspecified: Secondary | ICD-10-CM

## 2021-07-22 MED ORDER — PROPRANOLOL HCL 40 MG PO TABS: 40.0000 mg | ORAL_TABLET | Freq: Two times a day (BID) | ORAL | 0 refills | Status: AC

## 2021-07-22 MED ORDER — DILTIAZEM HCL 120 MG PO TABS: 120.0000 mg | ORAL_TABLET | Freq: Every day | ORAL | 0 refills | Status: AC

## 2021-07-22 NOTE — Telephone Encounter (Signed)
error 

## 2021-07-22 NOTE — Progress Notes (Signed)
Established patient visit   Patient: Kristina Jordan   DOB: 03-18-1951   70 y.o. Female  MRN: [id number] Visit Date: 07/22/2021  Today's healthcare provider: Lelon Huh, MD   Chief Complaint  Patient presents with   Follow-up   Atrial Fibrillation   Subjective    HPI   Follow up on medications. Is on propranolol and diltiazem apparently for history of atrial fibrillation, but has been consistently in SR on current medications. Last follow up with cardiology was in September and was in SR by EKG. Echo is October with significant mitral valve disease but normal EF. Feels well today with no complaints. Specifically no chest pains, palpitations, or dyspnea.    -----------------------------------------------------------------------------------------     Medications: Outpatient Medications Prior to Visit  Medication Sig   alendronate (FOSAMAX) 70 MG tablet TAKE 1 TABLET BY MOUTH ONCE A WEEK WITH  A  FULL  GLASS  OF  WATER  ON  AN  EMPTY  STOMACH.  DO  NOT  LIE  DOWN  UNTIL  AFTER  BREAKFAST   aspirin 81 MG tablet Take 81 mg by mouth daily.   calcium carbonate (OSCAL) 1500 (600 Ca) MG TABS tablet Take 1,200 mg of elemental calcium by mouth daily with breakfast.   Cholecalciferol (VITAMIN D) 50 MCG (2000 UT) tablet Take 4,000 Units by mouth daily.   diltiazem (CARDIZEM) 120 MG tablet Take 1 tablet (120 mg total) by mouth daily.   propranolol (INDERAL) 40 MG tablet Take 1 tablet (40 mg total) by mouth 2 (two) times daily.   Propylene Glycol (SYSTANE COMPLETE) 0.6 % SOLN Place 1 drop into both eyes daily as needed (dry eyes).   No facility-administered medications prior to visit.    Review of Systems  Constitutional:  Negative for appetite change, chills, fatigue and fever.  Respiratory:  Negative for chest tightness and shortness of breath.   Cardiovascular:  Negative for chest pain and palpitations.  Gastrointestinal:  Negative for abdominal pain, nausea and vomiting.   Neurological:  Negative for dizziness and weakness.      Objective    BP 104/60 (BP Location: Left Arm, Patient Position: Sitting, Cuff Size: Normal)   Pulse 60   Temp 98 F (36.7 C) (Oral)   Resp 16   Wt 114 lb (51.7 kg)   SpO2 100% Comment: room air  BMI 18.97 kg/m  {Show previous vital signs (optional):23777}  Physical Exam    General: Appearance:    Thin female in no acute distress  Eyes:    PERRL, conjunctiva/corneas clear, EOM's intact       Lungs:     Clear to auscultation bilaterally, respirations unlabored  Heart:    Normal heart rate. Regular rhythm.  2/6 blowing, holosystolic murmur at apex  3/6 high pitched, blowing, decrescendo, diastolic murmur at the left third intercostal space and lower sternal border    MS:   All extremities are intact.    Neurologic:   Awake, alert, oriented x 3. No apparent focal neurological defect.         Assessment & Plan     1. Chronic atrial fibrillation (HCC) Remains in sinus rhythm on CCB and beta-blocker. Continue routine follow up cardiology. Continue current medications.    2. Dilated cardiomyopathy ([place])   3. Need for influenza vaccination  - Flu Vaccine QUAD High Dose IM (Fluad)      The entirety of the information documented in the History of Present Illness, Review of  Systems and Physical Exam were personally obtained by me. Portions of this information were initially documented by the CMA and reviewed by me for thoroughness and accuracy.     Lelon Huh, MD  [facility] [phone] (phone) [phone] (fax)  [place]

## 2021-07-22 NOTE — Telephone Encounter (Signed)
Patient due for follow up. She has an appointment scheduled today with Dr. Caryn Section at 3:20pm for follow up on medications.   Last office visit: 01/17/2021  Last refill:  Diltiazem: 07/20/2020 #90 with 3 refills Propanolol:  07/20/2020 #180 with 3 refills

## 2021-07-22 NOTE — Telephone Encounter (Signed)
[facility] faxed refill request for the following medications:   diltiazem (CARDIZEM) 120 MG tablet   propranolol (INDERAL) 40 MG tablet   Please advise.

## 2021-07-29 DIAGNOSIS — I482 Chronic atrial fibrillation, unspecified: Secondary | ICD-10-CM | POA: Diagnosis not present

## 2021-07-29 DIAGNOSIS — R7309 Other abnormal glucose: Secondary | ICD-10-CM | POA: Diagnosis not present

## 2021-07-29 DIAGNOSIS — J302 Other seasonal allergic rhinitis: Secondary | ICD-10-CM | POA: Diagnosis not present

## 2021-07-29 DIAGNOSIS — Z0000 Encounter for general adult medical examination without abnormal findings: Secondary | ICD-10-CM | POA: Diagnosis not present

## 2021-07-29 DIAGNOSIS — Z23 Encounter for immunization: Secondary | ICD-10-CM | POA: Diagnosis not present

## 2021-07-29 DIAGNOSIS — M810 Age-related osteoporosis without current pathological fracture: Secondary | ICD-10-CM | POA: Diagnosis not present

## 2021-07-29 DIAGNOSIS — K219 Gastro-esophageal reflux disease without esophagitis: Secondary | ICD-10-CM | POA: Diagnosis not present

## 2021-07-29 DIAGNOSIS — I429 Cardiomyopathy, unspecified: Secondary | ICD-10-CM | POA: Diagnosis not present

## 2021-07-29 DIAGNOSIS — I341 Nonrheumatic mitral (valve) prolapse: Secondary | ICD-10-CM | POA: Diagnosis not present

## 2021-08-12 ENCOUNTER — Telehealth: Payer: Self-pay | Admitting: Physician Assistant

## 2021-08-12 NOTE — Telephone Encounter (Signed)
[place] faxed refill request for the following medications:   alendronate (FOSAMAX) 70 MG tablet   Please advise.

## 2021-08-13 ENCOUNTER — Other Ambulatory Visit: Payer: Self-pay

## 2021-08-13 MED ORDER — ALENDRONATE SODIUM 70 MG PO TABS: ORAL_TABLET | ORAL | 0 refills | Status: AC

## 2021-08-30 ENCOUNTER — Telehealth (HOSPITAL_COMMUNITY): Payer: Self-pay | Admitting: *Deleted

## 2021-08-30 ENCOUNTER — Telehealth: Payer: Self-pay | Admitting: Cardiology

## 2021-08-30 NOTE — Telephone Encounter (Signed)
Close encounter 

## 2021-08-30 NOTE — Telephone Encounter (Signed)
Called patient, discussed with patient she is scheduled with POET. No need for blood work, discussed instructions.  Patient verbalized understanding.

## 2021-08-30 NOTE — Telephone Encounter (Signed)
atient states she received a voice message about having lab work prior to 12/21 stress test, but I don't see any current orders. Is someone able to clarify whether or not lab work is needed? Thank you.

## 2021-09-04 ENCOUNTER — Other Ambulatory Visit: Payer: Self-pay

## 2021-09-04 ENCOUNTER — Ambulatory Visit (HOSPITAL_COMMUNITY)
Admission: RE | Admit: 2021-09-04 | Discharge: 2021-09-04 | Disposition: A | Discharge status: Home / Self Care | Payer: Medicare HMO | Source: Ambulatory Visit | Attending: Cardiology | Admitting: Cardiology

## 2021-09-04 ENCOUNTER — Encounter (HOSPITAL_COMMUNITY): Payer: Self-pay | Admitting: *Deleted

## 2021-09-04 DIAGNOSIS — R0602 Shortness of breath: Secondary | ICD-10-CM | POA: Diagnosis not present

## 2021-09-04 LAB — EXERCISE TOLERANCE TEST
Angina Index: 0
Estimated workload: 5.9
Exercise duration (min): 4 min
Exercise duration (sec): 5 s
MPHR: 148 {beats}/min
Peak HR: 176 {beats}/min
Percent HR: 118 %
Rest HR: 75 {beats}/min

## 2021-09-04 NOTE — Progress Notes (Signed)
Dr Percival Spanish reviewed ETT and stated that pt may leave.

## 2021-09-10 ENCOUNTER — Telehealth: Payer: Self-pay | Admitting: Cardiology

## 2021-09-10 DIAGNOSIS — Z01818 Encounter for other preprocedural examination: Secondary | ICD-10-CM

## 2021-09-10 DIAGNOSIS — R9431 Abnormal electrocardiogram [ECG] [EKG]: Secondary | ICD-10-CM

## 2021-09-10 DIAGNOSIS — R9439 Abnormal result of other cardiovascular function study: Secondary | ICD-10-CM

## 2021-09-10 NOTE — Telephone Encounter (Signed)
Patient is returning call to discuss stress test results. 

## 2021-09-10 NOTE — Telephone Encounter (Signed)
Gave patient the results of her stress test and let her know that Dr. Percival Spanish wants to schedule a coronary CTA. Will follow up with patient tomorrow to schedule. Patient voiced understanding. Will send results of exercise  stress test to [facility], PA-C

## 2021-09-11 ENCOUNTER — Other Ambulatory Visit: Payer: Self-pay

## 2021-09-11 DIAGNOSIS — Z01818 Encounter for other preprocedural examination: Secondary | ICD-10-CM

## 2021-09-11 NOTE — Telephone Encounter (Addendum)
Placed order for Coronary CT and labs, no rate control medication ordered. Called patient to go over instructions.  Labs released and mailed to patient along with CT instructions

## 2021-09-11 NOTE — Addendum Note (Signed)
Addended by: Jacqulynn Cadet on: 09/11/2021 12:34 PM   Modules accepted: Orders

## 2021-09-11 NOTE — Addendum Note (Signed)
Addended by: Jacqulynn Cadet on: 09/11/2021 12:25 PM   Modules accepted: Orders

## 2021-09-13 ENCOUNTER — Other Ambulatory Visit: Payer: Self-pay | Admitting: Physician Assistant

## 2021-09-18 ENCOUNTER — Other Ambulatory Visit: Payer: Medicare HMO

## 2021-09-19 LAB — BASIC METABOLIC PANEL
BUN/Creatinine Ratio: 23 (ref 12–28)
BUN: 20 mg/dL (ref 8–27)
CO2: 25 mmol/L (ref 20–29)
Calcium: 10.2 mg/dL (ref 8.7–10.3)
Chloride: 107 mmol/L — ABNORMAL HIGH (ref 96–106)
Creatinine, Ser: 0.88 mg/dL (ref 0.57–1.00)
Glucose: 84 mg/dL (ref 70–99)
Potassium: 4.3 mmol/L (ref 3.5–5.2)
Sodium: 144 mmol/L (ref 134–144)
eGFR: 70 mL/min/{1.73_m2} (ref >59)

## 2021-09-23 ENCOUNTER — Telehealth (HOSPITAL_COMMUNITY): Payer: Self-pay | Admitting: Emergency Medicine

## 2021-09-23 ENCOUNTER — Encounter: Payer: Self-pay | Admitting: *Deleted

## 2021-09-23 NOTE — Telephone Encounter (Signed)
Reaching out to patient to offer assistance regarding upcoming cardiac imaging study; pt verbalizes understanding of appt date/time, parking situation and where to check in, pre-test NPO status and medications ordered, and verified current allergies; name and call back number provided for further questions should they arise Marchia Bond RN [facility] [phone] office [phone] cell  Arrival time 11:30a Denies iv issues Daily meds

## 2021-09-24 ENCOUNTER — Other Ambulatory Visit: Payer: Self-pay

## 2021-09-24 ENCOUNTER — Ambulatory Visit (HOSPITAL_COMMUNITY)
Admission: RE | Admit: 2021-09-24 | Discharge: 2021-09-24 | Disposition: A | Discharge status: Home / Self Care | Payer: Medicare HMO | Source: Ambulatory Visit | Attending: Cardiology | Admitting: Cardiology

## 2021-09-24 DIAGNOSIS — R9439 Abnormal result of other cardiovascular function study: Secondary | ICD-10-CM

## 2021-09-24 DIAGNOSIS — R9431 Abnormal electrocardiogram [ECG] [EKG]: Secondary | ICD-10-CM

## 2021-09-24 MED ORDER — NITROGLYCERIN 0.4 MG SL SUBL
SUBLINGUAL_TABLET | SUBLINGUAL | Status: AC
  Filled 2021-09-24: qty 2

## 2021-09-24 MED ORDER — NITROGLYCERIN 0.4 MG SL SUBL
0.8000 mg | SUBLINGUAL_TABLET | Freq: Once | SUBLINGUAL | Status: AC
Start: 2021-09-24 — End: 2021-09-24
  Administered 2021-09-24: 0.8 mg via SUBLINGUAL

## 2021-09-24 MED ORDER — IOHEXOL 350 MG/ML SOLN
100.0000 mL | Freq: Once | INTRAVENOUS | Status: AC | PRN
  Administered 2021-09-24: 95 mL via INTRAVENOUS

## 2021-09-24 NOTE — Progress Notes (Signed)
CT scan completed. Tolerated well. D/C home ambulatory, awake and alert. In no distress. 

## 2021-12-24 ENCOUNTER — Encounter: Payer: Medicare HMO | Admitting: Family Medicine

## 2021-12-31 ENCOUNTER — Ambulatory Visit (HOSPITAL_COMMUNITY): Payer: Medicare HMO | Attending: Cardiology

## 2021-12-31 DIAGNOSIS — I341 Nonrheumatic mitral (valve) prolapse: Secondary | ICD-10-CM | POA: Insufficient documentation

## 2021-12-31 LAB — ECHOCARDIOGRAM COMPLETE
Area-P 1/2: 5.02 cm2
MV M vel: 6.32 m/s
MV Peak grad: 159.8 mmHg
Radius: 0.8 cm
S' Lateral: 2.9 cm

## 2022-01-02 NOTE — Progress Notes (Signed)
?  ?Cardiology Office Note ? ? ?Date:  01/03/2022  ? ?ID:  Kristina Jordan, DOB 11-Jan-1951, MRN [id number] ? ?PCP:  Donnamarie Rossetti, PA-C  ?Cardiologist:   Minus Breeding, MD ? ? ?Chief Complaint  ?Patient presents with  ? Mitral Valve Prolapse  ? ? ? ?  ?History of Present Illness: ?Kristina Jordan is a 71 y.o. 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   A cardiac catheterization demonstrated normal coronary arteries. Her ejection fraction was low normal at that time although it was normal on the echo in 2021.  She had mild MR.   There was moderate late systolic prolapse of the middle scallop of the posterior mitral leaflet.  She has moderate MR on echo a couple of days ago.   ? ?She has had a TEE in the fall which suggested it to be moderate to moderately severe.  Her LV function is somewhat low normal.  It seems unchanged on the echo transthoracic done the other day.  She has no symptoms.  She is right now caring for her husband who had hip surgery.  She is very active. ? ? ?Past Medical History:  ?Diagnosis Date  ? Hyperlipidemia   ? MITRAL INSUFFICIENCY   ? MITRAL VALVE PROLAPSE   ? Non-ST elevation MI (NSTEMI) ([place]) 09/09/2011  ? ? ?Past Surgical History:  ?Procedure Laterality Date  ? ABDOMINAL HYSTERECTOMY    ? BREAST BIOPSY Left   ? stereo- neg  ? BREAST EXCISIONAL BIOPSY Left   ? neg  ? BUBBLE STUDY  06/27/2021  ? Procedure: BUBBLE STUDY;  Surgeon: Elouise Munroe, MD;  Location: [facility];  Service: Cardiology;;  ? CERVICAL SPINE SURGERY    ? COLONOSCOPY WITH PROPOFOL N/A 12/02/2016  ? Procedure: COLONOSCOPY WITH PROPOFOL;  Surgeon: [name], MD;  Location: [facility];  Service: Endoscopy;  Laterality: N/A;  ? LEFT HEART CATHETERIZATION WITH CORONARY ANGIOGRAM N/A 09/10/2011  ? Procedure: LEFT HEART CATHETERIZATION WITH CORONARY ANGIOGRAM;  Surgeon: Wellington Hampshire, MD;  Location: [facility];  Service: Cardiovascular;  Laterality: N/A;  ? TEE  WITHOUT CARDIOVERSION N/A 06/27/2021  ? Procedure: TRANSESOPHAGEAL ECHOCARDIOGRAM (TEE);  Surgeon: Elouise Munroe, MD;  Location: [facility];  Service: Cardiology;  Laterality: N/A;  ? ? ? ?Current Outpatient Medications  ?Medication Sig Dispense Refill  ? alendronate (FOSAMAX) 70 MG tablet TAKE 1 TABLET BY MOUTH ONCE A WEEK WITH  A  FULL  GLASS  OF  WATER  ON  AN  EMPTY  STOMACH.  DO  NOT  LIE  DOWN  UNTIL  AFTER  BREAKFAST 4 tablet 0  ? aspirin 81 MG tablet Take 81 mg by mouth daily.    ? calcium carbonate (OSCAL) 1500 (600 Ca) MG TABS tablet Take 1,200 mg of elemental calcium by mouth daily with breakfast.    ? Cholecalciferol (VITAMIN D) 50 MCG (2000 UT) tablet Take 4,000 Units by mouth daily.    ? diltiazem (CARDIZEM) 120 MG tablet Take 1 tablet (120 mg total) by mouth daily. 90 tablet 0  ? propranolol (INDERAL) 40 MG tablet Take 1 tablet (40 mg total) by mouth 2 (two) times daily. 180 tablet 0  ? Propylene Glycol (SYSTANE COMPLETE) 0.6 % SOLN Place 1 drop into both eyes daily as needed (dry eyes).    ? ?No current facility-administered medications for this visit.  ? ? ?Allergies:   Sulfonamide derivatives  ? ? ?ROS:  Please see the  history of present illness.   Otherwise, review of systems are positive for none.   All other systems are reviewed and negative.  ? ? ?PHYSICAL EXAM: ?VS:  BP 116/60   Pulse 64   Ht '5\' 5"'$  (1.651 m)   Wt 107 lb 3.2 oz (48.6 kg)   SpO2 95%   BMI 17.84 kg/m?  , BMI Body mass index is 17.84 kg/m?. ?GENERAL:  Well appearing ?NECK:  No jugular venous distention, waveform within normal limits, carotid upstroke brisk and symmetric, no bruits, no thyromegaly ?LUNGS:  Clear to auscultation bilaterally ?CHEST:  Unremarkable ?HEART:  PMI not displaced or sustained,S1 and S2 within normal limits, no S3, no S4, no clicks, no rubs, positive mitral valve prolapse click, no rubs, no murmurs ?ABD:  Flat, positive bowel sounds normal in frequency in pitch, no bruits, no rebound, no  guarding, no midline pulsatile mass, no hepatomegaly, no splenomegaly ?EXT:  2 plus pulses throughout, no edema, no cyanosis no clubbing ? ?EKG:  EKG is   ordered today. ?The ekg ordered today demonstrates sinus rhythm, rate 64, axis within normal limits, intervals within normal limits, no acute changes. ? ? ?Recent Labs: ?01/16/2021: ALT 13; TSH 1.550 ?06/20/2021: Hemoglobin 12.3; Platelets 220 ?09/18/2021: BUN 20; Creatinine, Ser 0.88; Potassium 4.3; Sodium 144  ? ? ?Lipid Panel ?   ?Component Value Date/Time  ? CHOL 149 01/16/2021 0933  ? TRIG 82 01/16/2021 0933  ? HDL 69 01/16/2021 0933  ? CHOLHDL 2.2 01/16/2021 0933  ? CHOLHDL 2.5 09/09/2011 1646  ? VLDL 29 09/09/2011 1646  ? [place] 64 01/16/2021 0933  ? ?  ? ?Wt Readings from Last 3 Encounters:  ?01/03/22 107 lb 3.2 oz (48.6 kg)  ?07/22/21 114 lb (51.7 kg)  ?06/27/21 115 lb (52.2 kg)  ?  ? ? ?Other studies Reviewed: ?Additional studies/ records that were reviewed today include: Echocardiogram, labs. ?Review of the above records demonstrates: See elsewhere ? ? ?ASSESSMENT AND PLAN: ? ?MITRAL VALVE PROLAPSE -  ?She has mitral valve prolapse with moderate MR.   This looks to be repairable.  I am going to follow this closely with another 64-monthechocardiogram transthoracic.  She understands that eventually I will be referring her for consideration of repair.  ? ?CARDIOMYOPATHY ?Her EF was normal on the recent echo.  This is unchanged.  Is probably slightly low for the degree of mitral regurgitation and will be one of the indicators for repair.  Her blood pressure will not allow med titration. ? ? ?Current medicines are reviewed at length with the patient today.  The patient does not have concerns regarding medicines. ? ?The following changes have been made:   Non ? ?Labs/ tests ordered today include:   ? ?Orders Placed This Encounter  ?Procedures  ? EKG 12-Lead  ? ECHOCARDIOGRAM COMPLETE  ? ? ? ? ?Disposition:   FU with me in 6 months after the  echocardiogram ? ? ?Signed, ?JMinus Breeding MD  ?01/03/2022 10:43 AM    ?[facility]? ? ?

## 2022-01-03 ENCOUNTER — Encounter: Payer: Self-pay | Admitting: Cardiology

## 2022-01-03 ENCOUNTER — Ambulatory Visit: Payer: Medicare HMO | Admitting: Cardiology

## 2022-01-03 VITALS — BP 116/60 | HR 64 | Ht 65.0 in | Wt 107.2 lb

## 2022-01-03 DIAGNOSIS — I341 Nonrheumatic mitral (valve) prolapse: Secondary | ICD-10-CM

## 2022-01-03 DIAGNOSIS — I080 Rheumatic disorders of both mitral and aortic valves: Secondary | ICD-10-CM | POA: Diagnosis not present

## 2022-01-03 NOTE — Patient Instructions (Signed)
Medication Instructions:  ?No changes ?*If you need a refill on your cardiac medications before your next appointment, please call your pharmacy* ? ? ?Lab Work: ?None ordered ?If you have labs (blood work) drawn today and your tests are completely normal, you will receive your results only by: ?MyChart Message (if you have MyChart) OR ?A paper copy in the mail ?If you have any lab test that is abnormal or we need to change your treatment, we will call you to review the results. ? ? ?Testing/Procedures: ?Your physician has requested that you have an echocardiogram in 6 months. Echocardiography is a painless test that uses sound waves to create images of your heart. It provides your doctor with information about the size and shape of your heart and how well your heart?s chambers and valves are working. You may receive an ultrasound enhancing agent through an IV if needed to better visualize your heart during the echo.This procedure takes approximately one hour. There are no restrictions for this procedure. This will take place at the [address], [address].  ? ? ? ?Follow-Up: ?At [facility], you and your health needs are our priority.  As part of our continuing mission to provide you with exceptional heart care, we have created designated Provider Care Teams.  These Care Teams include your primary Cardiologist (physician) and Advanced Practice Providers (APPs -  Physician Assistants and Nurse Practitioners) who all work together to provide you with the care you need, when you need it. ? ?We recommend signing up for the patient portal called "MyChart".  Sign up information is provided on this After Visit Summary.  MyChart is used to connect with patients for Virtual Visits (Telemedicine).  Patients are able to view lab/test results, encounter notes, upcoming appointments, etc.  Non-urgent messages can be sent to your provider as well.   ?To learn more about what you can do with MyChart, go to  [email or web address].   ? ?Your next appointment:   ?6 month(s) ? ?The format for your next appointment:   ?In Person ? ?Provider:   ?Minus Breeding, MD { ? ? ?Important Information About Sugar ? ? ? ? ? ? ?

## 2022-01-10 IMAGING — MG MM DIGITAL SCREENING BILAT W/ TOMO AND CAD
6 of 10 series · 6 of 30 positions shown · non-contrast
Comparison: Previous exam(s).

CLINICAL DATA: Screening.

EXAM:
DIGITAL SCREENING BILATERAL MAMMOGRAM WITH TOMOSYNTHESIS AND CAD
TECHNIQUE: Bilateral screening digital craniocaudal and mediolateral oblique
mammograms were obtained. Bilateral screening digital breast
tomosynthesis was performed. The images were evaluated with
computer-aided detection.

[R CC synth-2D]
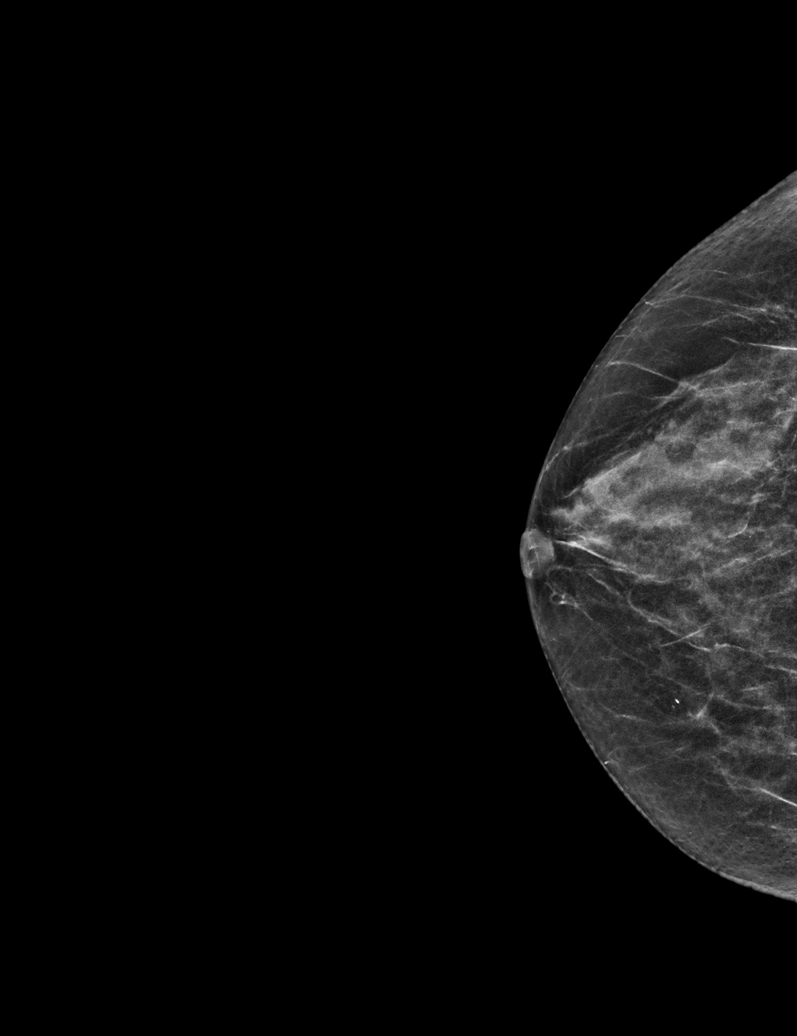

[L MLO synth-2D]
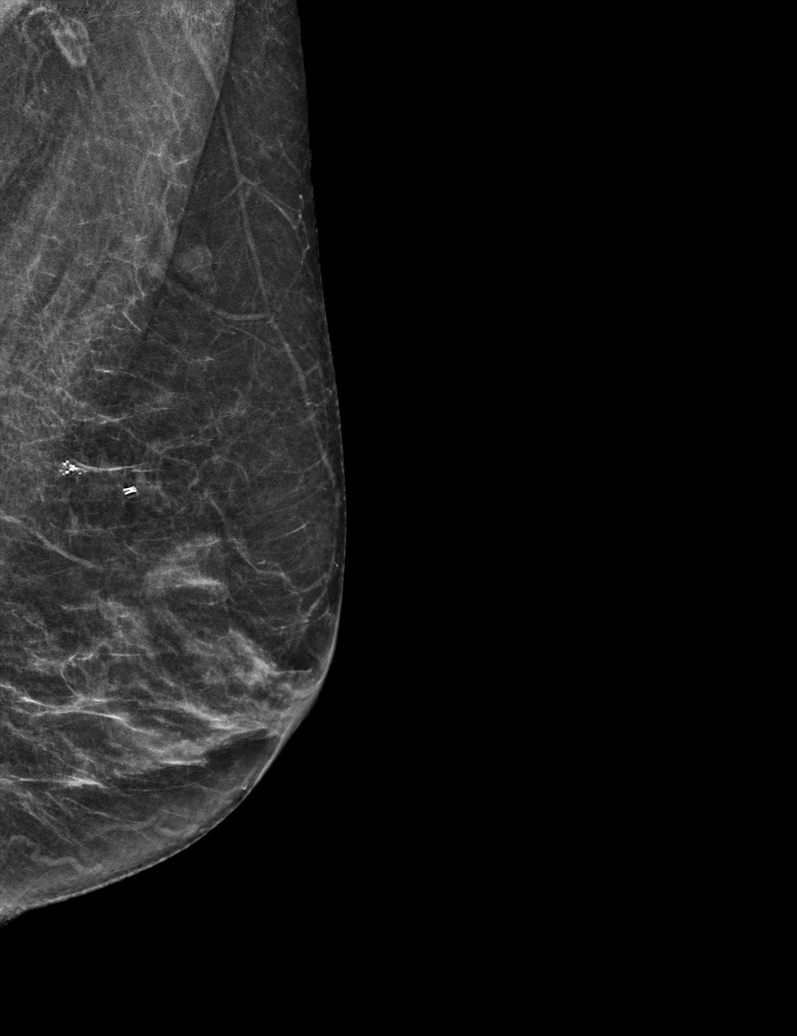

[R MLO synth-2D]
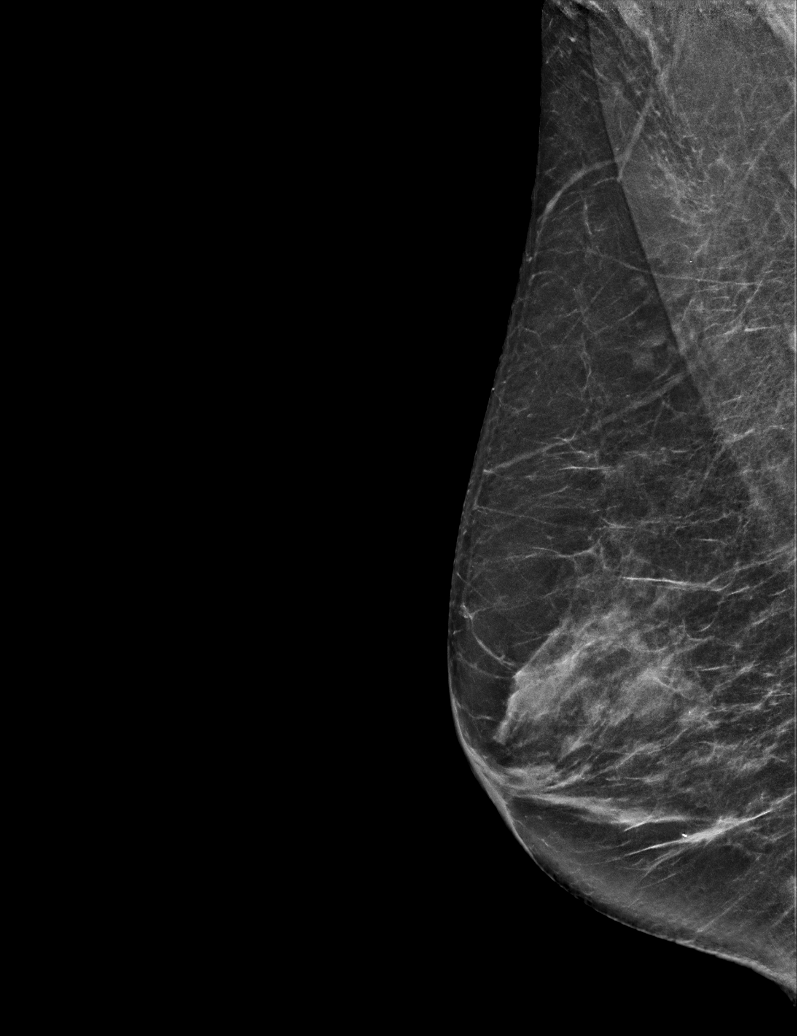

[L XCCL synth-2D]
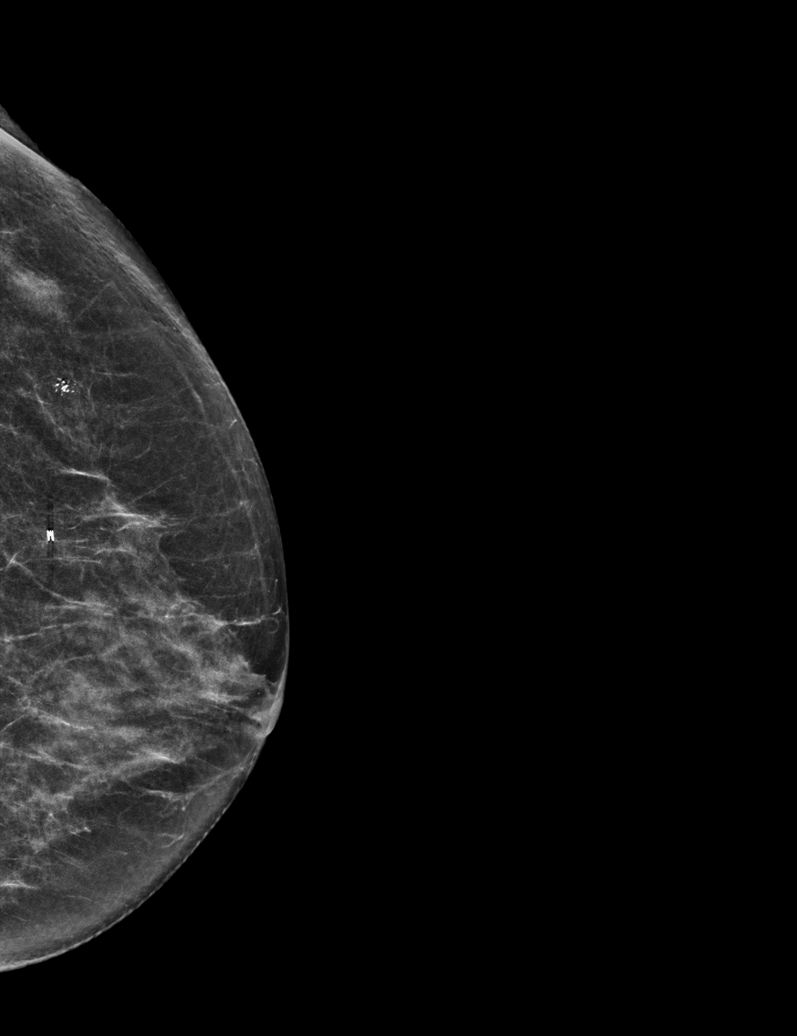

[L CC synth-2D]
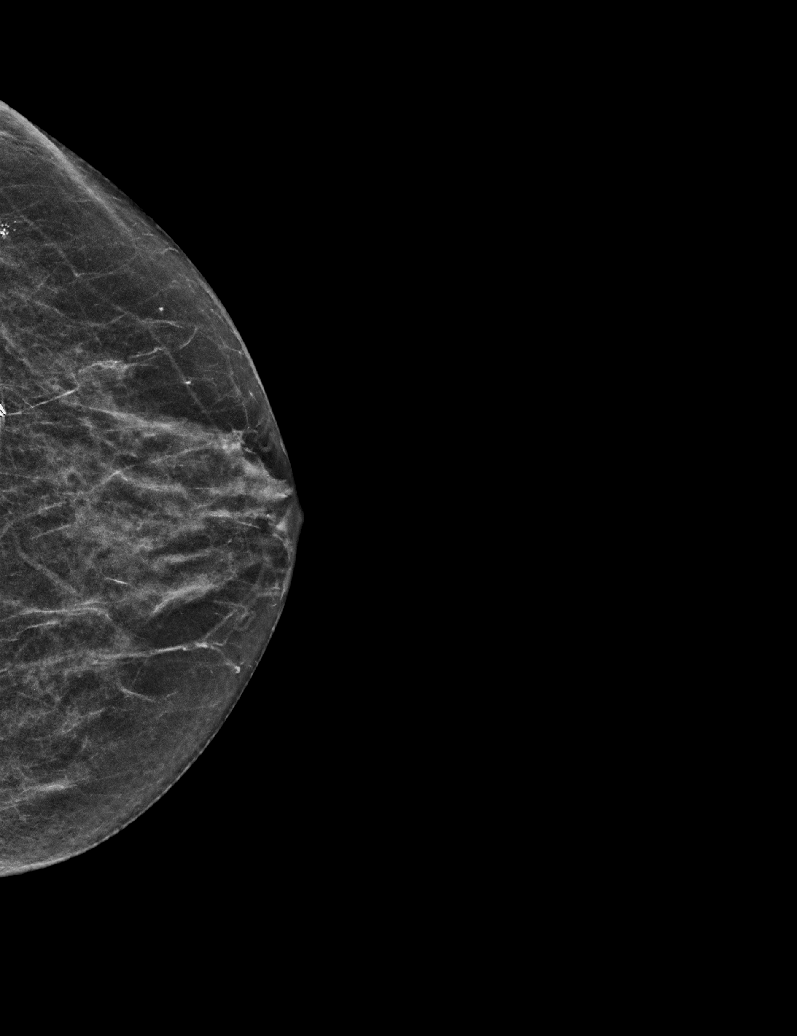

[R MLO tomo · tomo slice 31/60.0]
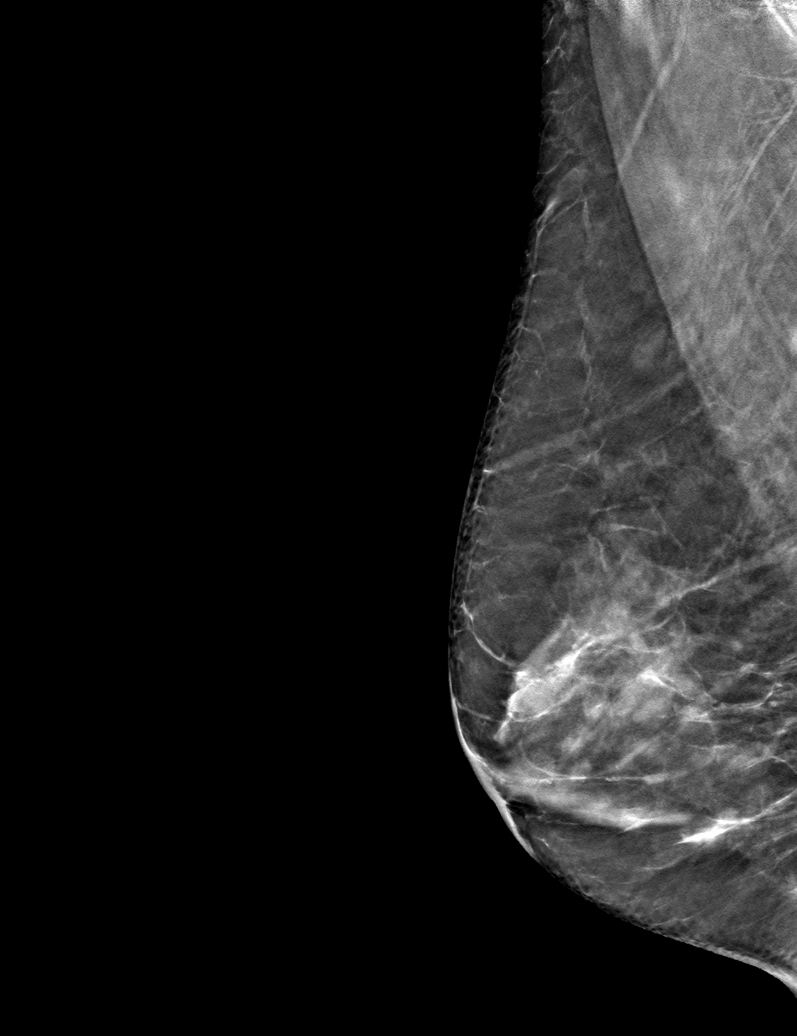

[6 of 30 positions shown; findings below may reference images not displayed]

ACR Breast Density Category c: The breast tissue is heterogeneously
dense, which may obscure small masses.
FINDINGS: There are no findings suspicious for malignancy.
IMPRESSION: No mammographic evidence of malignancy. A result letter of this
screening mammogram will be mailed directly to the patient.

RECOMMENDATION:
Screening mammogram in one year. (Code:[id number])

BI-RADS CATEGORY  1: Negative.

## 2022-02-06 ENCOUNTER — Other Ambulatory Visit: Payer: Self-pay | Admitting: Family Medicine

## 2022-02-06 ENCOUNTER — Ambulatory Visit
Admission: RE | Admit: 2022-02-06 | Discharge: 2022-02-06 | Disposition: A | Discharge status: Home / Self Care | Payer: Medicare HMO | Source: Ambulatory Visit | Attending: Family Medicine | Admitting: Family Medicine

## 2022-02-06 DIAGNOSIS — L089 Local infection of the skin and subcutaneous tissue, unspecified: Secondary | ICD-10-CM | POA: Insufficient documentation

## 2022-02-06 DIAGNOSIS — R634 Abnormal weight loss: Secondary | ICD-10-CM | POA: Diagnosis present

## 2022-02-06 DIAGNOSIS — R932 Abnormal findings on diagnostic imaging of liver and biliary tract: Secondary | ICD-10-CM | POA: Insufficient documentation

## 2022-02-06 DIAGNOSIS — W57XXXA Bitten or stung by nonvenomous insect and other nonvenomous arthropods, initial encounter: Secondary | ICD-10-CM | POA: Insufficient documentation

## 2022-02-06 DIAGNOSIS — S1096XA Insect bite of unspecified part of neck, initial encounter: Secondary | ICD-10-CM | POA: Insufficient documentation

## 2022-02-06 MED ORDER — IOHEXOL 300 MG/ML  SOLN
80.0000 mL | Freq: Once | INTRAMUSCULAR | Status: AC | PRN
  Administered 2022-02-06: 80 mL via INTRAVENOUS

## 2022-04-02 ENCOUNTER — Ambulatory Visit
Admission: RE | Admit: 2022-04-02 | Discharge: 2022-04-02 | Disposition: A | Discharge status: Home / Self Care | Payer: Medicare HMO | Source: Ambulatory Visit | Attending: Pulmonary Disease | Admitting: Pulmonary Disease

## 2022-04-02 DIAGNOSIS — R918 Other nonspecific abnormal finding of lung field: Secondary | ICD-10-CM

## 2022-04-16 ENCOUNTER — Encounter: Payer: Self-pay | Admitting: Pulmonary Disease

## 2022-04-16 ENCOUNTER — Ambulatory Visit: Payer: Medicare HMO | Admitting: Pulmonary Disease

## 2022-04-16 VITALS — BP 118/62 | HR 61 | Temp 98.1°F | Ht 68.0 in | Wt 103.8 lb

## 2022-04-16 DIAGNOSIS — R918 Other nonspecific abnormal finding of lung field: Secondary | ICD-10-CM

## 2022-04-16 DIAGNOSIS — R0602 Shortness of breath: Secondary | ICD-10-CM | POA: Diagnosis not present

## 2022-04-16 NOTE — Progress Notes (Signed)
Subjective:    Patient ID: Kristina Jordan, female    DOB: 1950-10-28, 72 y.o.   MRN: [id number] Patient Care Team: Donnamarie Rossetti, PA-C as PCP - General (Family Medicine) Minus Breeding, MD as PCP - Cardiology (Cardiology) Dingeldein, Remo Lipps, MD as Consulting Physician (Ophthalmology) Dasher, Rayvon Char, MD (Dermatology) Tyler Pita, MD as Consulting Physician (Pulmonary Disease) Minus Breeding, MD as Consulting Physician (Cardiology)   Chief Complaint  Patient presents with   Follow-up    CT 04/02/2022-   HPI Kristina Jordan is a 71 year old lifelong never smoker who presents for follow-up of pulmonary nodules incidentally and initially found on a CT scan of the chest performed on 31 July 2020.  She was last seen on 04 April 2021, at that time recommendation was to follow-up CT in a year.  This is a scheduled visit for follow-up on her most recent CT of 02 April 2022.  Patient has not noticed any shortness of breath wheezing or sputum production.  She has had a mild nonproductive cough associated with nasal congestion after the seasonal change.  She has not had any hemoptysis. She has not had any fevers, chills or sweats.  No chest pain. She has not had any weight loss or anorexia.  Mild shortness of breath on inclines and stairs.  No issues with GERD.     She had a follow-up chest CT performed on 02 April 2022  that shows a stability of multiple bilateral lung nodules consistent with benign etiology.  A previously noted groundglass nodule has completely resolved.  There are some minor areas consistent with inflammatory change otherwise no suspicious nodules or findings.   Review of Systems A 10 point review of systems was performed and it is as noted above otherwise negative.  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   Substance Use Topics   Alcohol use: No   Allergies  Allergen Reactions   Sulfonamide Derivatives Rash   Current Meds  Medication Sig   alendronate (FOSAMAX) 70 MG tablet TAKE 1 TABLET BY MOUTH ONCE A WEEK WITH  A  FULL  GLASS  OF  WATER  ON  AN  EMPTY  STOMACH.  DO  NOT  LIE  DOWN  UNTIL  AFTER  BREAKFAST   aspirin 81 MG tablet Take 81 mg by mouth daily.   calcium carbonate (OSCAL) 1500 (600 Ca) MG TABS tablet Take 1,200 mg by mouth daily with breakfast.   Cholecalciferol (VITAMIN D) 50 MCG (2000 UT) tablet Take 4,000 Units by mouth daily.   diltiazem (CARDIZEM) 120 MG tablet Take 1 tablet (120 mg total) by mouth daily.   propranolol (INDERAL) 40 MG tablet Take 1 tablet (40 mg total) by mouth 2 (two) times  daily.   Propylene Glycol (SYSTANE COMPLETE) 0.6 % SOLN Place 1 drop into both eyes daily as needed (dry eyes).   Immunization History  Administered Date(s) Administered   Fluad Quad(high Dose 65+) 06/29/2019, 07/20/2020, 07/22/2021   H1N1 10/12/2008   Influenza Inj Mdck Quad Pf 07/30/2022   Influenza, High Dose Seasonal PF 07/31/2014, 08/02/2015, 07/18/2016, 08/04/2017, 07/14/2018   Pneumococcal Conjugate-13 11/17/2014   Pneumococcal Polysaccharide-23 06/02/2011, 11/07/2016   Tdap 10/21/2010   Zoster, Live 10/27/2011        Objective:   Physical Exam BP 118/62 (BP  Location: Left Arm, Cuff Size: Normal)   Pulse 61   Temp 98.1 F (36.7 C) (Temporal)   Ht 5' 8"$  (1.727 m)   Wt 103 lb 12.8 oz (47.1 kg)   SpO2 97%   BMI 15.78 kg/m   GENERAL: Slender, well-developed woman in no acute respiratory distress.  Fully ambulatory. HEAD: Normocephalic, atraumatic. EYES: Pupils equal, round, reactive to light.  No scleral icterus. MOUTH: Nose/mouth/throat not examined due to masking requirements for COVID 19. NECK: Supple. No thyromegaly. Trachea midline. No JVD.  No adenopathy. PULMONARY: Good air entry bilaterally.  No adventitious sounds. CARDIOVASCULAR: S1 and S2. Regular rate and rhythm.  Possible midsystolic click, no other murmurs or extra heart sounds. ABDOMEN: Benign. MUSCULOSKELETAL: No joint deformity, no clubbing, no edema. NEUROLOGIC: No focal deficits, no gait disturbance, speech is fluent. SKIN: Intact,warm,dry. PSYCH: Mood and behavior normal.       Assessment & Plan:     ICD-10-CM   1. Multiple lung nodules on CT  R91.8    Considered benign by follow-up CT No further imaging necessary    2. SOB (shortness of breath)  R06.02 Pulmonary Function Test [facility] Only   On inclines and stairs Mild inflammation on CT Query asthma PFTs     Orders Placed This Encounter  Procedures   Pulmonary Function Test [facility] Only    Next available.    Standing Status:   Future    Number of Occurrences:   1    Standing Expiration Date:   04/17/2023    Order Specific Question:   Full PFT: includes the following: basic spirometry, spirometry pre & post bronchodilator, diffusion capacity (DLCO), lung volumes    Answer:   Full PFT   See the patient in follow-up in 3 months time to reconvene after PFTs.  She is to contact us prior to that time should any new difficulties arise.  Renold Don, MD Advanced Bronchoscopy [facility]    *This note was dictated using voice recognition software/Dragon.  Despite best efforts to  proofread, errors can occur which can change the meaning. Any transcriptional errors that result from this process are unintentional and may not be fully corrected at the time of dictation.

## 2022-04-16 NOTE — Patient Instructions (Signed)
Your lung nodules are very small about the size of the tip of a well sharpened Crayon.  They have not changed.  You do not need more CTs of the chest.  The CT did show some areas of mild inflammation, I will get some breathing test this will tell me more about your airways.  We will see you in follow-up in 3 months time call sooner should any new problems arise.

## 2022-04-21 ENCOUNTER — Ambulatory Visit: Payer: Medicare HMO | Admitting: Pulmonary Disease

## 2022-06-23 ENCOUNTER — Ambulatory Visit (HOSPITAL_COMMUNITY): Payer: Medicare HMO | Attending: Cardiology

## 2022-06-23 DIAGNOSIS — I080 Rheumatic disorders of both mitral and aortic valves: Secondary | ICD-10-CM | POA: Diagnosis not present

## 2022-06-23 DIAGNOSIS — I340 Nonrheumatic mitral (valve) insufficiency: Secondary | ICD-10-CM

## 2022-06-23 LAB — ECHOCARDIOGRAM COMPLETE
Area-P 1/2: 2.42 cm2
MV M vel: 5.52 m/s
MV Peak grad: 121.9 mmHg
Radius: 0.6 cm
S' Lateral: 3.2 cm

## 2022-06-25 NOTE — Progress Notes (Signed)
Cardiology Office Note   Date:  06/26/2022   ID:  Kristina Jordan, DOB Aug 23, 1951, MRN [id number]  PCP:  Kristina Rossetti, PA-C  Cardiologist:   Kristina Breeding, MD   Chief Complaint  Patient presents with   Palpitations    History of Present Illness: Kristina Jordan is a 71 y.o. 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   A cardiac catheterization demonstrated normal coronary arteries. Her ejection fraction was low normal at that time although it was normal on the echo in 2021.  She had mild MR.   There was moderate late systolic prolapse of the middle scallop of the posterior mitral leaflet.   She has had a TEE in the fall 2022 which suggested it to be moderate to moderately severe.  Her LV function was somewhat low normal.  Her echo last week demonstrates an LVEF of 57%.  MR is reported to be moderate.    She has no new symptoms.  She feels occasional palpitations but these are nonsustained.  She is not having any new shortness of breath, PND or orthopnea.  She has not had any new palpitations, presyncope or syncope.  Her husband has some hip surgery and she takes him to the doctor's.  She does a lot of "running around."  She gets fatigued sometimes with this and has some mild dyspnea but on careful questioning this is in keeping with what she is doing.    Past Medical History:  Diagnosis Date   Hyperlipidemia    MITRAL INSUFFICIENCY    MITRAL VALVE PROLAPSE    Non-ST elevation MI (NSTEMI) ([place]) 09/09/2011    Past Surgical History:  Procedure Laterality Date   ABDOMINAL HYSTERECTOMY     BREAST BIOPSY Left    stereo- neg   BREAST EXCISIONAL BIOPSY Left    neg   BUBBLE STUDY  06/27/2021   Procedure: BUBBLE STUDY;  Surgeon: Kristina Munroe, MD;  Location: [facility];  Service: Cardiology;;   CERVICAL SPINE SURGERY     COLONOSCOPY WITH PROPOFOL N/A 12/02/2016   Procedure: COLONOSCOPY WITH PROPOFOL;  Surgeon: Kristina Lame, MD;   Location: [facility];  Service: Endoscopy;  Laterality: N/A;   LEFT HEART CATHETERIZATION WITH CORONARY ANGIOGRAM N/A 09/10/2011   Procedure: LEFT HEART CATHETERIZATION WITH CORONARY ANGIOGRAM;  Surgeon: Kristina Hampshire, MD;  Location: [facility];  Service: Cardiovascular;  Laterality: N/A;   TEE WITHOUT CARDIOVERSION N/A 06/27/2021   Procedure: TRANSESOPHAGEAL ECHOCARDIOGRAM (TEE);  Surgeon: Kristina Munroe, MD;  Location: [facility];  Service: Cardiology;  Laterality: N/A;     Current Outpatient Medications  Medication Sig Dispense Refill   alendronate (FOSAMAX) 70 MG tablet TAKE 1 TABLET BY MOUTH ONCE A WEEK WITH  A  FULL  GLASS  OF  WATER  ON  AN  EMPTY  STOMACH.  DO  NOT  LIE  DOWN  UNTIL  AFTER  BREAKFAST 4 tablet 0   aspirin 81 MG tablet Take 81 mg by mouth daily.     calcium carbonate (OSCAL) 1500 (600 Ca) MG TABS tablet Take 1,200 mg of elemental calcium by mouth daily with breakfast.     Cholecalciferol (VITAMIN D) 50 MCG (2000 UT) tablet Take 4,000 Units by mouth daily.     diltiazem (CARDIZEM) 120 MG tablet Take 1 tablet (120 mg total) by mouth daily. 90 tablet 0   propranolol (INDERAL) 40 MG tablet Take 1 tablet (40 mg total) by mouth  2 (two) times daily. 180 tablet 0   Propylene Glycol (SYSTANE COMPLETE) 0.6 % SOLN Place 1 drop into both eyes daily as needed (dry eyes).     No current facility-administered medications for this visit.    Allergies:   Sulfonamide derivatives    ROS:  Please see the history of present illness.   Otherwise, review of systems are positive for none.   All other systems are reviewed and negative.    PHYSICAL EXAM: VS:  BP (!) 110/52   Pulse (!) 54   Ht '5\' 5"'$  (1.651 m)   Wt 102 lb 6.4 oz (46.4 kg)   SpO2 99%   BMI 17.04 kg/m  , BMI Body mass index is 17.04 kg/m. GENERAL:  Well appearing NECK:  No jugular venous distention, waveform within normal limits, carotid upstroke brisk and symmetric, no bruits, no thyromegaly LUNGS:   Clear to auscultation bilaterally CHEST:  Unremarkable HEART:  PMI not displaced or sustained,S1 and S2 within normal limits, no S3, no S4, no clicks, no rubs, no murmurs ABD:  Flat, positive bowel sounds normal in frequency in pitch, no bruits, no rebound, no guarding, no midline pulsatile mass, no hepatomegaly, no splenomegaly EXT:  2 plus pulses throughout, no edema, no cyanosis no clubbing   EKG:  EKG is   ordered today. The ekg ordered today demonstrates sinus rhythm, rate 70, axis within normal limits, intervals within normal limits, no acute changes.  PACs   Recent Labs: 09/18/2021: BUN 20; Creatinine, Ser 0.88; Potassium 4.3; Sodium 144    Lipid Panel    Component Value Date/Time   CHOL 149 01/16/2021 0933   TRIG 82 01/16/2021 0933   HDL 69 01/16/2021 0933   CHOLHDL 2.2 01/16/2021 0933   CHOLHDL 2.5 09/09/2011 1646   VLDL 29 09/09/2011 1646   LDLCALC 64 01/16/2021 0933      Wt Readings from Last 3 Encounters:  06/26/22 102 lb 6.4 oz (46.4 kg)  04/16/22 103 lb 12.8 oz (47.1 kg)  01/03/22 107 lb 3.2 oz (48.6 kg)      Other studies Reviewed: Additional studies/ records that were reviewed today include: Echo Review of the above records demonstrates: See elsewhere   ASSESSMENT AND PLAN:  MITRAL VALVE PROLAPSE -  She has mitral valve prolapse with moderate MR.   I went back and looked at the previous TEE report and I do not see regurgitant fraction or volumes but it is said to be moderately severe.  The EF is preserved.  He probably has a repairable valve.  I will keep a close eye on her and see her back clinically in 6 months and we will likely repeat an echo at that time.  We talked about shortness of breath and tachycardia arrhythmias that she would alert me to.  She is not having any of these.   CARDIOMYOPATHY Her EF was normal on the recent echo.  As above.   WEIGHT LOSS She has an unintentional weight loss.  Apparently she had a work-up she is going to see her  primary care doctor again for possibly labs.  She also has been reducing her carbs.  She was told her sugars were high.  This may be the explanation but I will defer further work-up to her primary provider.  Current medicines are reviewed at length with the patient today.  The patient does not have concerns regarding medicines.  The following changes have been made:   None  Labs/ tests ordered today include:  None  Orders Placed This Encounter  Procedures   EKG 12-Lead      Disposition:   FU with me in six month.     Signed, Kristina Breeding, MD  06/26/2022 10:47 AM    [facility]

## 2022-06-26 ENCOUNTER — Encounter: Payer: Self-pay | Admitting: Cardiology

## 2022-06-26 ENCOUNTER — Ambulatory Visit: Payer: Medicare HMO | Attending: Cardiology | Admitting: Cardiology

## 2022-06-26 VITALS — BP 110/52 | HR 54 | Ht 65.0 in | Wt 102.4 lb

## 2022-06-26 DIAGNOSIS — I080 Rheumatic disorders of both mitral and aortic valves: Secondary | ICD-10-CM

## 2022-06-26 DIAGNOSIS — I341 Nonrheumatic mitral (valve) prolapse: Secondary | ICD-10-CM

## 2022-06-26 DIAGNOSIS — I499 Cardiac arrhythmia, unspecified: Secondary | ICD-10-CM | POA: Diagnosis not present

## 2022-06-26 NOTE — Patient Instructions (Signed)
Medication Instructions:  Your physician recommends that you continue on your current medications as directed. Please refer to the Current Medication list given to you today.  *If you need a refill on your cardiac medications before your next appointment, please call your pharmacy*   Lab Work: NONE ordered at this time of appointment   If you have labs (blood work) drawn today and your tests are completely normal, you will receive your results only by: [place] (if you have MyChart) OR A paper copy in the mail If you have any lab test that is abnormal or we need to change your treatment, we will call you to review the results.   Testing/Procedures: NONE ordered at this time of appointment     Follow-Up: At [facility], you and your health needs are our priority.  As part of our continuing mission to provide you with exceptional heart care, we have created designated Provider Care Teams.  These Care Teams include your primary Cardiologist (physician) and Advanced Practice Providers (APPs -  Physician Assistants and Nurse Practitioners) who all work together to provide you with the care you need, when you need it.  We recommend signing up for the patient portal called "MyChart".  Sign up information is provided on this After Visit Summary.  MyChart is used to connect with patients for Virtual Visits (Telemedicine).  Patients are able to view lab/test results, encounter notes, upcoming appointments, etc.  Non-urgent messages can be sent to your provider as well.   To learn more about what you can do with MyChart, go to [email or web address].    Your next appointment:   6 month(s)  The format for your next appointment:   In Person  Provider:   Minus Breeding, MD      Important Information About Sugar

## 2022-07-01 ENCOUNTER — Ambulatory Visit: Payer: Medicare HMO | Attending: Family Medicine

## 2022-07-01 DIAGNOSIS — R0602 Shortness of breath: Secondary | ICD-10-CM | POA: Insufficient documentation

## 2022-07-01 LAB — PULMONARY FUNCTION TEST ARMC ONLY
DL/VA % pred: 92 %
DL/VA: 3.8 ml/min/mmHg/L
DLCO unc % pred: 82 %
DLCO unc: 16.48 ml/min/mmHg
FEF 25-75 Post: 0.97 L/sec
FEF 25-75 Pre: 0.92 L/sec
FEF2575-%Change-Post: 5 %
FEF2575-%Pred-Post: 53 %
FEF2575-%Pred-Pre: 50 %
FEV1-%Change-Post: 1 %
FEV1-%Pred-Post: 68 %
FEV1-%Pred-Pre: 66 %
FEV1-Post: 1.55 L
FEV1-Pre: 1.52 L
FEV1FVC-%Change-Post: 1 %
FEV1FVC-%Pred-Pre: 90 %
FEV6-%Change-Post: 1 %
FEV6-%Pred-Post: 77 %
FEV6-%Pred-Pre: 76 %
FEV6-Post: 2.24 L
FEV6-Pre: 2.21 L
FEV6FVC-%Change-Post: 0 %
FEV6FVC-%Pred-Post: 104 %
FEV6FVC-%Pred-Pre: 103 %
FVC-%Change-Post: 0 %
FVC-%Pred-Post: 74 %
FVC-%Pred-Pre: 74 %
FVC-Post: 2.25 L
FVC-Pre: 2.23 L
Post FEV1/FVC ratio: 69 %
Post FEV6/FVC ratio: 100 %
Pre FEV1/FVC ratio: 68 %
Pre FEV6/FVC Ratio: 99 %
RV % pred: 138 %
RV: 3.18 L
TLC % pred: 104 %
TLC: 5.44 L

## 2022-07-01 MED ORDER — ALBUTEROL SULFATE (2.5 MG/3ML) 0.083% IN NEBU
2.5000 mg | INHALATION_SOLUTION | Freq: Once | RESPIRATORY_TRACT | Status: AC
  Administered 2022-07-01: 2.5 mg via RESPIRATORY_TRACT

## 2022-07-17 ENCOUNTER — Ambulatory Visit: Payer: Medicare HMO | Admitting: Pulmonary Disease

## 2022-07-17 ENCOUNTER — Encounter: Payer: Self-pay | Admitting: Pulmonary Disease

## 2022-07-17 VITALS — BP 124/72 | HR 66 | Temp 97.5°F | Ht 65.0 in | Wt 101.6 lb

## 2022-07-17 DIAGNOSIS — R918 Other nonspecific abnormal finding of lung field: Secondary | ICD-10-CM | POA: Diagnosis not present

## 2022-07-17 DIAGNOSIS — J4540 Moderate persistent asthma, uncomplicated: Secondary | ICD-10-CM

## 2022-07-17 MED ORDER — TRELEGY ELLIPTA 100-62.5-25 MCG/ACT IN AEPB: 1.0000 | INHALATION_SPRAY | Freq: Every day | RESPIRATORY_TRACT | 0 refills | Status: DC

## 2022-07-17 MED ORDER — TRELEGY ELLIPTA 100-62.5-25 MCG/ACT IN AEPB: 1.0000 | INHALATION_SPRAY | Freq: Every day | RESPIRATORY_TRACT | 11 refills | Status: DC

## 2022-07-17 NOTE — Patient Instructions (Signed)
We are giving you a trial of an inhaler called Trelegy Ellipta.  This is 1 puff daily.  Make sure you rinse your mouth well after you use it.  We will see you in follow-up in 6 to 8 weeks time call sooner should any new problems arise.

## 2022-07-17 NOTE — Progress Notes (Signed)
Subjective:    Patient ID: Kristina Jordan, female    DOB: August 28, 1951, 71 y.o.   MRN: [id number] Patient Care Team: Wilford Corner, PA-C as PCP - General (Family Medicine) Rollene Rotunda, MD as PCP - Cardiology (Cardiology) Dingeldein, Viviann Spare, MD as Consulting Physician (Ophthalmology) Dasher, Cliffton Asters, MD (Dermatology) Salena Saner, MD as Consulting Physician (Pulmonary Disease) Rollene Rotunda, MD as Consulting Physician (Cardiology)  Chief Complaint  Patient presents with   Follow-up    No SOB of wheezing. Has her normal dry cough.    HPI Kristina Jordan is a 71 year old lifelong never smoker who presents for follow-up of pulmonary nodules incidentally and initially found on a CT scan of the chest performed on 31 July 2020.  She was last seen on 16 April 2022, at that time she had completed a CT chest on 02 April 2022 that showed stability of the nodules and no need to continue surveillance as given stability, these are benign.  During that visit we also addressed the issue of a dry nonproductive cough that has plagued her number of years.  She had PFTs performed 01 July 2022 the findings are consistent with moderate airway obstruction digestive of small airways disease.  Patient has not noticed any shortness of breath level ground, no wheezing or sputum production. She has not had any hemoptysis. She has not had any fevers, chills or sweats.  No chest pain. She has not had any weight loss or anorexia.  Mild shortness of breath on inclines and stairs.  No issues with GERD.     She does not endorse any other symptomatology today.  Review of Systems A 10 point review of systems was performed and it is as noted above otherwise negative.  Patient Active Problem List   Diagnosis Date Noted   Atrial septal aneurysm    Dilated cardiomyopathy (HCC) 11/08/2020   Ganglion cyst of flexor tendon sheath of finger 12/30/2016   Hypercholesterolemia without hypertriglyceridemia  11/19/2015   Avitaminosis D 11/19/2015   Brachial neuritis 04/16/2010   MITRAL INSUFFICIENCY 12/26/2009   MVP (mitral valve prolapse) 12/24/2009   FIBRILLATION, ATRIAL 12/24/2009   Heart disease 11/01/2009   Leg varices 03/19/2009   Acquired trigger finger 01/02/2009   Need for prophylactic hormone replacement therapy (postmenopausal) 10/09/2006   OP (osteoporosis) 10/09/2006   Social History   Tobacco Use   Smoking status: Former    Years: 0.00    Types: Cigarettes   Smokeless tobacco: Never   Tobacco comments:    only socially in high school.   Substance Use Topics   Alcohol use: No   Allergies  Allergen Reactions   Sulfonamide Derivatives Rash   Current Meds  Medication Sig   alendronate (FOSAMAX) 70 MG tablet TAKE 1 TABLET BY MOUTH ONCE A WEEK WITH  A  FULL  GLASS  OF  WATER  ON  AN  EMPTY  STOMACH.  DO  NOT  LIE  DOWN  UNTIL  AFTER  BREAKFAST   aspirin 81 MG tablet Take 81 mg by mouth daily.   calcium carbonate (OSCAL) 1500 (600 Ca) MG TABS tablet Take 1,200 mg by mouth daily with breakfast.   Cholecalciferol (VITAMIN D) 50 MCG (2000 UT) tablet Take 4,000 Units by mouth daily.   diltiazem (CARDIZEM) 120 MG tablet Take 1 tablet (120 mg total) by mouth daily.   propranolol (INDERAL) 40 MG tablet Take 1 tablet (40 mg total) by mouth 2 (two) times daily.   Propylene Glycol (  SYSTANE COMPLETE) 0.6 % SOLN Place 1 drop into both eyes daily as needed (dry eyes).   Immunization History  Administered Date(s) Administered   Fluad Quad(high Dose 65+) 06/29/2019, 07/20/2020, 07/22/2021   H1N1 10/12/2008   Influenza, High Dose Seasonal PF 07/31/2014, 08/02/2015, 07/18/2016, 08/04/2017, 07/14/2018   Pneumococcal Conjugate-13 11/17/2014   Pneumococcal Polysaccharide-23 06/02/2011, 11/07/2016   Tdap 10/21/2010   Zoster, Live 10/27/2011       Objective:   Physical Exam BP 124/72 (BP Location: Left Arm, Cuff Size: Normal)   Pulse 66   Temp (!) 97.5 F (36.4 C)   Ht 5\' 5"   (1.651 m)   Wt 101 lb 9.6 oz (46.1 kg)   SpO2 98%   BMI 16.91 kg/m  GENERAL: Slender, well-developed woman in no acute respiratory distress.  Fully ambulatory. HEAD: Normocephalic, atraumatic. EYES: Pupils equal, round, reactive to light.  No scleral icterus. MOUTH: Nose/mouth/throat not examined due to masking requirements for COVID 19. NECK: Supple. No thyromegaly. Trachea midline. No JVD.  No adenopathy. PULMONARY: Good air entry bilaterally.  No adventitious sounds. CARDIOVASCULAR: S1 and S2. Regular rate and rhythm.  Possible midsystolic click, no other murmurs or extra heart sounds. ABDOMEN: Benign. MUSCULOSKELETAL: No joint deformity, no clubbing, no edema. NEUROLOGIC: No focal deficits, no gait disturbance, speech is fluent. SKIN: Intact,warm,dry. PSYCH: Mood and behavior normal.  Recent Results (from the past 2160 hour(s))  ECHOCARDIOGRAM COMPLETE     Status: None   Collection Time: 06/23/22 11:33 AM  Result Value Ref Range   Area-P 1/2 2.42 cm2   S' Lateral 3.20 cm   Radius 0.60 cm   MV M vel 5.52 m/s   MV Peak grad 121.9 mmHg  Pulmonary Function Test [facility] Only     Status: None   Collection Time: 07/01/22 10:39 AM  Result Value Ref Range   FVC-Pre 2.23 L   FVC-%Pred-Pre 74 %   FVC-Post 2.25 L   FVC-%Pred-Post 74 %   FVC-%Change-Post 0 %   FEV1-Pre 1.52 L   FEV1-%Pred-Pre 66 %   FEV1-Post 1.55 L   FEV1-%Pred-Post 68 %   FEV1-%Change-Post 1 %   FEV6-Pre 2.21 L   FEV6-%Pred-Pre 76 %   FEV6-Post 2.24 L   FEV6-%Pred-Post 77 %   FEV6-%Change-Post 1 %   Pre FEV1/FVC ratio 68 %   FEV1FVC-%Pred-Pre 90 %   Post FEV1/FVC ratio 69 %   FEV1FVC-%Change-Post 1 %   Pre FEV6/FVC Ratio 99 %   FEV6FVC-%Pred-Pre 103 %   Post FEV6/FVC ratio 100 %   FEV6FVC-%Pred-Post 104 %   FEV6FVC-%Change-Post 0 %   FEF 25-75 Pre 0.92 L/sec   FEF2575-%Pred-Pre 50 %   FEF 25-75 Post 0.97 L/sec   FEF2575-%Pred-Post 53 %   FEF2575-%Change-Post 5 %   RV 3.18 L   RV % pred 138 %   TLC  5.44 L   TLC % pred 104 %   DLCO unc 16.48 ml/min/mmHg   DLCO unc % pred 82 %   DL/VA 1.61 ml/min/mmHg/L   DL/VA % pred 92 %  Moderate obstruction indicative of small airways disease      Assessment & Plan:     ICD-10-CM   1. Moderate persistent asthma without complication  J45.40    Evidence of small airways obstruction in PFTs Lack of reversibility suggestive of airway remodeling Trial of Trelegy Ellipta 100,1 puff daily    2. Lung nodules  R91.8    No need for further imaging per last CT  Meds ordered this encounter  Medications   Fluticasone-Umeclidin-Vilant (TRELEGY ELLIPTA) 100-62.5-25 MCG/ACT AEPB    Sig: Inhale 1 puff into the lungs daily.    Dispense:  14 each    Refill:  0    Order Specific Question:   Lot Number?    Answer:   [id number]    Order Specific Question:   Expiration Date?    Answer:   10/17/2023    Order Specific Question:   Quantity    Answer:   1   Fluticasone-Umeclidin-Vilant (TRELEGY ELLIPTA) 100-62.5-25 MCG/ACT AEPB    Sig: Inhale 1 puff into the lungs daily.    Dispense:  28 each    Refill:  11   We have provided the patient with samples of Trelegy Ellipta as well as a prescription.  We will see her in follow-up in 6 to 8 weeks time she is to contact us prior to that time should any new difficulties arise.  Gailen Shelter, MD Advanced Bronchoscopy PCCM [facility]    *This note was dictated using voice recognition software/Dragon.  Despite best efforts to proofread, errors can occur which can change the meaning. Any transcriptional errors that result from this process are unintentional and may not be fully corrected at the time of dictation.

## 2022-07-31 ENCOUNTER — Other Ambulatory Visit: Payer: Self-pay | Admitting: Family Medicine

## 2022-07-31 DIAGNOSIS — Z1231 Encounter for screening mammogram for malignant neoplasm of breast: Secondary | ICD-10-CM

## 2022-08-06 ENCOUNTER — Ambulatory Visit
Admission: RE | Admit: 2022-08-06 | Discharge: 2022-08-06 | Disposition: A | Discharge status: Home / Self Care | Payer: Medicare HMO | Source: Ambulatory Visit | Attending: Family Medicine | Admitting: Family Medicine

## 2022-08-06 DIAGNOSIS — Z1231 Encounter for screening mammogram for malignant neoplasm of breast: Secondary | ICD-10-CM | POA: Insufficient documentation

## 2022-09-03 ENCOUNTER — Encounter: Payer: Self-pay | Admitting: Pulmonary Disease

## 2022-09-03 ENCOUNTER — Ambulatory Visit: Payer: Medicare HMO | Admitting: Pulmonary Disease

## 2022-09-03 VITALS — BP 110/60 | HR 69 | Temp 97.5°F | Ht 65.0 in | Wt 101.6 lb

## 2022-09-03 DIAGNOSIS — R0602 Shortness of breath: Secondary | ICD-10-CM | POA: Diagnosis not present

## 2022-09-03 DIAGNOSIS — J4540 Moderate persistent asthma, uncomplicated: Secondary | ICD-10-CM | POA: Diagnosis not present

## 2022-09-03 DIAGNOSIS — R918 Other nonspecific abnormal finding of lung field: Secondary | ICD-10-CM | POA: Diagnosis not present

## 2022-09-03 DIAGNOSIS — I340 Nonrheumatic mitral (valve) insufficiency: Secondary | ICD-10-CM | POA: Diagnosis not present

## 2022-09-03 LAB — NITRIC OXIDE: Nitric Oxide: 5

## 2022-09-03 NOTE — Progress Notes (Signed)
Subjective:    Patient ID: Kristina Jordan, female    DOB: [birth year], 71 y.o.   MRN: [id number] Patient Care Team: Wilford Corner, PA-C as PCP - General (Family Medicine) Rollene Rotunda, MD as PCP - Cardiology (Cardiology) Dingeldein, Viviann Spare, MD as Consulting Physician (Ophthalmology) Dasher, Cliffton Asters, MD (Dermatology) Salena Saner, MD as Consulting Physician (Pulmonary Disease) Rollene Rotunda, MD as Consulting Physician (Cardiology)  Chief Complaint  Patient presents with   Follow-up    No SOB or wheezing. Cough with clear or white sputum.    HPI Kristina Jordan is a 71 year old lifelong never smoker who presents for follow-up of pulmonary nodules incidentally and initially found on a CT scan of the chest performed on 31 July 2020.  The patient was initially seen here in January 2022 for the issue of the nodules.  The patient was noted to have stable findings and she was set for a follow-up with a CT in a year.  She done presented in August 2023 that showed stability of the nodules and likely benign etiology given the stability of over 2 years.  At that time she also complained of a nonproductive cough PFTs were subsequently performed in October 2023 showing moderate airway obstruction with significant small airways disease.  She was last seen on 17 July 2022, and the PFTs finding she was given a trial of Trelegy Ellipta 100, 1 inhalation daily.  The patient has not noticed any shortness of breath level ground, no wheezing or sputum production. She has not had any hemoptysis. She has not had any fevers, chills or sweats.  No chest pain. She has not had any weight loss or anorexia.  Mild shortness of breath on inclines and stairs.  No issues with GERD.  Trelegy has helped her cough however she wants a trial off of the medication.  I advised against this given the moderate obstruction noted on PFTs however she would want to give this a try.  Overall she feels well and looks  well.   Review of Systems A 10 point review of systems was performed and it is as noted above otherwise negative. Patient Active Problem List   Diagnosis Date Noted   Atrial septal aneurysm    Dilated cardiomyopathy (HCC) 11/08/2020   Ganglion cyst of flexor tendon sheath of finger 12/30/2016   Hypercholesterolemia without hypertriglyceridemia 11/19/2015   Avitaminosis D 11/19/2015   Brachial neuritis 04/16/2010   MITRAL INSUFFICIENCY 12/26/2009   MVP (mitral valve prolapse) 12/24/2009   FIBRILLATION, ATRIAL 12/24/2009   Heart disease 11/01/2009   Leg varices 03/19/2009   Acquired trigger finger 01/02/2009   Need for prophylactic hormone replacement therapy (postmenopausal) 10/09/2006   OP (osteoporosis) 10/09/2006   Social History   Tobacco Use   Smoking status: Former    Years: 0.00    Types: Cigarettes   Smokeless tobacco: Never   Tobacco comments:    only socially in high school.   Substance Use Topics   Alcohol use: No   Allergies  Allergen Reactions   Sulfonamide Derivatives Rash   Current Meds  Medication Sig   alendronate (FOSAMAX) 70 MG tablet TAKE 1 TABLET BY MOUTH ONCE A WEEK WITH  A  FULL  GLASS  OF  WATER  ON  AN  EMPTY  STOMACH.  DO  NOT  LIE  DOWN  UNTIL  AFTER  BREAKFAST   aspirin 81 MG tablet Take 81 mg by mouth daily.   calcium carbonate (OSCAL) 1500 (600 Ca)  MG TABS tablet Take 1,200 mg by mouth daily with breakfast.   Cholecalciferol (VITAMIN D) 50 MCG (2000 UT) tablet Take 4,000 Units by mouth daily.   diltiazem (CARDIZEM) 120 MG tablet Take 1 tablet (120 mg total) by mouth daily.   Fluticasone-Umeclidin-Vilant (TRELEGY ELLIPTA) 100-62.5-25 MCG/ACT AEPB Inhale 1 puff into the lungs daily.   propranolol (INDERAL) 40 MG tablet Take 1 tablet (40 mg total) by mouth 2 (two) times daily.   Propylene Glycol (SYSTANE COMPLETE) 0.6 % SOLN Place 1 drop into both eyes daily as needed (dry eyes).       Objective:   Physical Exam BP 110/60 (BP Location:  Left Arm, Cuff Size: Normal)   Pulse 69   Temp (!) 97.5 F (36.4 C)   Ht 5\' 5"  (1.651 m)   Wt 101 lb 9.6 oz (46.1 kg)   SpO2 99%   BMI 16.91 kg/m   SpO2: 99 % O2 Device: None (Room air)  GENERAL: Slender, well-developed woman in no acute respiratory distress.  Fully ambulatory. HEAD: Normocephalic, atraumatic. EYES: Pupils equal, round, reactive to light.  No scleral icterus. MOUTH: Dentition intact.  No thrush. NECK: Supple. No thyromegaly. Trachea midline. No JVD.  No adenopathy. PULMONARY: Good air entry bilaterally.  No adventitious sounds. CARDIOVASCULAR: S1 and S2. Regular rate and rhythm.  Grade 2/6 mitral regurgitation murmur, no other murmurs or extra heart sounds. ABDOMEN: Benign. MUSCULOSKELETAL: No joint deformity, no clubbing, no edema. NEUROLOGIC: No focal deficits, no gait disturbance, speech is fluent. SKIN: Intact,warm,dry. PSYCH: Mood and behavior normal.  Lab Results  Component Value Date   NITRICOXIDE 5 09/03/2022       Assessment & Plan:     ICD-10-CM   1. Moderate persistent asthma without complication  J45.40 Nitric oxide   I recommend she continue Trelegy She wants a trial off of this Recommend a short period of trial but resume if cough/shortness of breath ensues    2. Lung nodules  R91.8    No need for further imaging    3. SOB (shortness of breath)  R06.02 Nitric oxide   Has been markedly improved on Trelegy    4. Mitral valve insufficiency, unspecified etiology  I34.0    This issue adds complexity to her management Follows with cardiology     Orders Placed This Encounter  Procedures   Nitric oxide   Will see the patient in follow-up in 6 months time she is to call sooner should any problems arise.  Gailen Shelter, MD Advanced Bronchoscopy PCCM [facility]    *This note was dictated using voice recognition software/Dragon.  Despite best efforts to proofread, errors can occur which can change the meaning. Any  transcriptional errors that result from this process are unintentional and may not be fully corrected at the time of dictation.

## 2022-09-03 NOTE — Patient Instructions (Addendum)
After you finish your current Trelegy, you may give a trial of being off of it however if you notice increasing shortness of breath or cough you should go back to taking it.  We will see you in follow-up in 6 months time call sooner should any new problems arise.

## 2022-12-01 ENCOUNTER — Telehealth: Payer: Self-pay | Admitting: Cardiology

## 2022-12-01 DIAGNOSIS — I080 Rheumatic disorders of both mitral and aortic valves: Secondary | ICD-10-CM

## 2022-12-01 DIAGNOSIS — I341 Nonrheumatic mitral (valve) prolapse: Secondary | ICD-10-CM

## 2022-12-01 NOTE — Telephone Encounter (Signed)
Patient is requesting call back to discuss possible echo that may need to be scheduled. She states she normally has them done before her appt and would like to make sure she doesn't need one again. Please advise.

## 2022-12-01 NOTE — Telephone Encounter (Signed)
Called patient, she states she is to follow up with Dr.Hochrein for 6 month visit on 04/12- last OV states would repeat at time of the visit for an ECHO.   Patient would like to know if he would want this before or after the visit.   Advised I would check in with MD. Thanks!

## 2022-12-04 NOTE — Telephone Encounter (Signed)
Spoke with pt, echo scheduled for tomorrow at 7:30 am at [facility].

## 2022-12-05 ENCOUNTER — Ambulatory Visit: Payer: Medicare HMO | Attending: Cardiology

## 2022-12-05 DIAGNOSIS — I080 Rheumatic disorders of both mitral and aortic valves: Secondary | ICD-10-CM

## 2022-12-05 DIAGNOSIS — I341 Nonrheumatic mitral (valve) prolapse: Secondary | ICD-10-CM

## 2022-12-05 LAB — ECHOCARDIOGRAM COMPLETE
AR max vel: 1.85 cm2
AV Area VTI: 1.73 cm2
AV Area mean vel: 1.69 cm2
AV Mean grad: 2 mmHg
AV Peak grad: 3.7 mmHg
Ao pk vel: 0.96 m/s
Area-P 1/2: 3.99 cm2
Calc EF: 50.8 %
S' Lateral: 2.7 cm
Single Plane A2C EF: 52.1 %
Single Plane A4C EF: 51.9 %

## 2022-12-25 DIAGNOSIS — I340 Nonrheumatic mitral (valve) insufficiency: Secondary | ICD-10-CM | POA: Insufficient documentation

## 2022-12-25 DIAGNOSIS — R634 Abnormal weight loss: Secondary | ICD-10-CM | POA: Insufficient documentation

## 2022-12-25 NOTE — Progress Notes (Signed)
  Cardiology Office Note:   Date:  12/26/2022  ID:  Kristina Jordan, DOB 02-12-1951, MRN [id number]  History of Present Illness:   Kristina Jordan is a 72 y.o. 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   A cardiac catheterization demonstrated normal coronary arteries. Her ejection fraction was low normal at that time although it was normal on the echo in 2021.  She had mild MR.   There was moderate late systolic prolapse of the middle scallop of the posterior mitral leaflet.   She has had a TEE in the fall 2022 which suggested it to be moderate to moderately severe.  Her LV function was somewhat low normal.  Her echo last week demonstrates an LVEF of 57%.  MR is reported to be moderate.     Since I last saw her she has had no new cardiovascular complaints. The patient denies any new symptoms such as chest discomfort, neck or arm discomfort. There has been no new shortness of breath, PND or orthopnea. There have been no reported palpitations, presyncope or syncope.    ROS: As stated in the HPI and negative for all other systems.  Studies Reviewed:    EKG:  NA    Risk Assessment/Calculations:     Physical Exam:   VS:  BP (!) 92/58 (BP Location: Left Arm, Patient Position: Sitting, Cuff Size: Normal)   Pulse (!) 55   Ht 5\' 5"  (1.651 m)   Wt 104 lb 12.8 oz (47.5 kg)   SpO2 97%   BMI 17.44 kg/m    Wt Readings from Last 3 Encounters:  12/26/22 104 lb 12.8 oz (47.5 kg)  09/03/22 101 lb 9.6 oz (46.1 kg)  07/17/22 101 lb 9.6 oz (46.1 kg)     GEN: Well nourished, well developed in no acute distress NECK: No JVD; No carotid bruits CARDIAC: RRR, 2 out of 6 apical systolic murmur, late systolic and heard best anteriorly, no diastolic murmurs, rubs, gallops RESPIRATORY:  Clear to auscultation without rales, wheezing or rhonchi  ABDOMEN: Soft, non-tender, non-distended EXTREMITIES:  No edema; No deformity   ASSESSMENT AND PLAN:   MITRAL VALVE PROLAPSE -   She has mitral valve prolapse with moderate to moderately severe MR. My plan is to see her back in October.  I will likely plan for repeat TEE after that visit.  She has moderately severe MR that is probably a repairable valve but would likely need surgical repair  CARDIOMYOPATHY Her EF was preserved on the last echo.  No change in therapy          Signed, Rollene Rotunda, MD

## 2022-12-26 ENCOUNTER — Encounter: Payer: Self-pay | Admitting: Cardiology

## 2022-12-26 ENCOUNTER — Ambulatory Visit: Payer: Medicare HMO | Attending: Cardiology | Admitting: Cardiology

## 2022-12-26 VITALS — BP 92/58 | HR 55 | Ht 65.0 in | Wt 104.8 lb

## 2022-12-26 DIAGNOSIS — R634 Abnormal weight loss: Secondary | ICD-10-CM | POA: Diagnosis not present

## 2022-12-26 DIAGNOSIS — I340 Nonrheumatic mitral (valve) insufficiency: Secondary | ICD-10-CM | POA: Diagnosis not present

## 2022-12-26 NOTE — Patient Instructions (Signed)
Medication Instructions:   Not needed *If you need a refill on your cardiac medications before your next appointment, please call your pharmacy*   Lab Work: Not needed    Testing/Procedures:  Not needed  Follow-Up: At [facility], you and your health needs are our priority.  As part of our continuing mission to provide you with exceptional heart care, we have created designated Provider Care Teams.  These Care Teams include your primary Cardiologist (physician) and Advanced Practice Providers (APPs -  Physician Assistants and Nurse Practitioners) who all work together to provide you with the care you need, when you need it.     Your next appointment:   6 month(s)  The format for your next appointment:   In Person  Provider:   Rollene Rotunda, MD

## 2023-01-16 ENCOUNTER — Encounter: Payer: Self-pay | Admitting: Pulmonary Disease

## 2023-03-16 ENCOUNTER — Encounter: Payer: Self-pay | Admitting: Pulmonary Disease

## 2023-03-18 ENCOUNTER — Ambulatory Visit: Payer: Medicare HMO | Admitting: Pulmonary Disease

## 2023-03-18 ENCOUNTER — Encounter: Payer: Self-pay | Admitting: Pulmonary Disease

## 2023-03-18 VITALS — BP 120/70 | HR 60 | Temp 97.7°F | Ht 65.0 in | Wt 104.0 lb

## 2023-03-18 DIAGNOSIS — I340 Nonrheumatic mitral (valve) insufficiency: Secondary | ICD-10-CM | POA: Diagnosis not present

## 2023-03-18 DIAGNOSIS — R0602 Shortness of breath: Secondary | ICD-10-CM

## 2023-03-18 DIAGNOSIS — R918 Other nonspecific abnormal finding of lung field: Secondary | ICD-10-CM | POA: Diagnosis not present

## 2023-03-18 DIAGNOSIS — J4540 Moderate persistent asthma, uncomplicated: Secondary | ICD-10-CM | POA: Diagnosis not present

## 2023-03-18 NOTE — Progress Notes (Addendum)
Subjective:    Patient ID: Kristina Jordan, female    DOB: 15-Nov-1950, 72 y.o.   MRN: [id number]  Patient Care Team: Wilford Corner, PA-C as PCP - General (Family Medicine) Rollene Rotunda, MD as PCP - Cardiology (Cardiology) Dingeldein, Viviann Spare, MD as Consulting Physician (Ophthalmology) Dasher, Cliffton Asters, MD (Dermatology) Salena Saner, MD as Consulting Physician (Pulmonary Disease) Rollene Rotunda, MD as Consulting Physician (Cardiology)  Chief Complaint  Patient presents with   Follow-up    No SOB or wheezing. Cough with clear sputum.   HPI Kristina Jordan is a 72 year old lifelong never smoker who presents for follow-up of pulmonary nodules incidentally and initially found on a CT scan of the chest performed on 31 July 2020.  The patient was initially seen here in January 2022 for the issue of the nodules.  The patient was noted to have stable findings and she was set for a follow-up with a CT in a year.  She then follow-up CT chest in July 2023 that showed stability of the nodules and likely benign etiology given the stability of over 2 years.  At her August follow-up visit she also complained of a nonproductive cough.  PFTs were subsequently performed in October 2023 showing moderate airway obstruction with significant small airways disease.  She was last seen on 17 July 2022, and the PFTs finding she was given a trial of Trelegy Ellipta 100, 1 inhalation daily.  The patient has not noticed any shortness of breath level ground, no wheezing or sputum production. She has not had any hemoptysis. She has not had any fevers, chills or sweats.  No chest pain. She has not had any weight loss or anorexia.  Mild shortness of breath on inclines and stairs.  No issues with GERD.  At her request she tried being off of Trelegy and is still off of the medication.  She has had no wheezing however she has had cough productive of clear white sputum which has recurred.  She does note occasional  shortness of breath particular when she is under stress or when exerting.  She notes that this is "her normal".  She does not endorse any fevers, chills or sweats.  No hemoptysis.  No orthopnea or paroxysmal nocturnal dyspnea.  No gastroesophageal reflux symptoms.   Overall she feels well and looks well.   Review of Systems A 10 point review of systems was performed and it is as noted above otherwise negative.   Patient Active Problem List   Diagnosis Date Noted   Nonrheumatic mitral valve regurgitation 12/25/2022   Weight loss 12/25/2022   Atrial septal aneurysm    Dilated cardiomyopathy (HCC) 11/08/2020   Ganglion cyst of flexor tendon sheath of finger 12/30/2016   Hypercholesterolemia without hypertriglyceridemia 11/19/2015   Avitaminosis D 11/19/2015   Brachial neuritis 04/16/2010   MITRAL INSUFFICIENCY 12/26/2009   MVP (mitral valve prolapse) 12/24/2009   FIBRILLATION, ATRIAL 12/24/2009   Heart disease 11/01/2009   Leg varices 03/19/2009   Acquired trigger finger 01/02/2009   Need for prophylactic hormone replacement therapy (postmenopausal) 10/09/2006   OP (osteoporosis) 10/09/2006    Social History   Tobacco Use   Smoking status: Former    Years: 0    Types: Cigarettes   Smokeless tobacco: Never   Tobacco comments:    only socially in high school.   Substance Use Topics   Alcohol use: No    Allergies  Allergen Reactions   Sulfonamide Derivatives Rash    Current Meds  Medication Sig   alendronate (FOSAMAX) 70 MG tablet TAKE 1 TABLET BY MOUTH ONCE A WEEK WITH  A  FULL  GLASS  OF  WATER  ON  AN  EMPTY  STOMACH.  DO  NOT  LIE  DOWN  UNTIL  AFTER  BREAKFAST   aspirin 81 MG tablet Take 81 mg by mouth daily.   calcium carbonate (OSCAL) 1500 (600 Ca) MG TABS tablet Take 1,200 mg by mouth daily with breakfast.   Cholecalciferol (VITAMIN D) 50 MCG (2000 UT) tablet Take 4,000 Units by mouth daily.   diltiazem (CARDIZEM) 120 MG tablet Take 1 tablet (120 mg total) by  mouth daily.   propranolol (INDERAL) 40 MG tablet Take 1 tablet (40 mg total) by mouth 2 (two) times daily.   Propylene Glycol (SYSTANE COMPLETE) 0.6 % SOLN Place 1 drop into both eyes daily as needed (dry eyes).    Immunization History  Administered Date(s) Administered   Fluad Quad(high Dose 65+) 06/29/2019, 07/20/2020, 07/22/2021   H1N1 10/12/2008   Influenza Inj Mdck Quad Pf 07/30/2022   Influenza, High Dose Seasonal PF 07/31/2014, 08/02/2015, 07/18/2016, 08/04/2017, 07/14/2018   Pneumococcal Conjugate-13 11/17/2014   Pneumococcal Polysaccharide-23 06/02/2011, 11/07/2016   Tdap 10/21/2010   Zoster, Live 10/27/2011        Objective:   BP 120/70 (BP Location: Left Arm, Cuff Size: Normal)   Pulse 60   Temp 97.7 F (36.5 C)   Ht 5\' 5"  (1.651 m)   Wt 104 lb (47.2 kg)   SpO2 100%   BMI 17.31 kg/m   SpO2: 100 % O2 Device: None (Room air)  GENERAL: Slender, well-developed woman in no acute respiratory distress.  Fully ambulatory. HEAD: Normocephalic, atraumatic. EYES: Pupils equal, round, reactive to light.  No scleral icterus. MOUTH: Dentition intact.  No thrush. NECK: Supple. No thyromegaly. Trachea midline. No JVD.  No adenopathy. PULMONARY: Good air entry bilaterally.  No adventitious sounds. CARDIOVASCULAR: S1 and S2. Regular rate and rhythm.  Grade 2/6 mitral regurgitation murmur, no other murmurs or extra heart sounds. ABDOMEN: Benign. MUSCULOSKELETAL: No joint deformity, no clubbing, no edema. NEUROLOGIC: No focal deficits, no gait disturbance, speech is fluent. SKIN: Intact,warm,dry. PSYCH: Mood and behavior normal.  Ambulatory oximetry was performed today: At rest on room air oxygen saturation was 100%, the patient ambulated at a moderate pace, completed 3 laps, O2 nadir 99%, no shortness of breath.  Resting heart rate was 62 bpm at maximum for this exercise 83 bpm.       Assessment & Plan:     ICD-10-CM   1. Moderate persistent asthma without complication   J45.40 Pulmonary Function Test [facility] Only   She has moderate obstruction on PFTs Suspect she has some airway remodeling She is reluctant to use daily ICS/LABA Will reassess    2. Lung nodules  R91.8    Last CT July 2023 No change over 2 years on the nodules Presumed benign    3. SOB (shortness of breath)  R06.02    Multifactorial Airway remodeling from persistent asthma/ Mitral regurg/deconditioning    4. Mitral valve insufficiency, unspecified etiology  I34.0    Moderate by echo performed March 2024 This issue adds complexity to her management Follows with cardiology      Orders Placed This Encounter  Procedures   Pulmonary Function Test [facility] Only    Standing Status:   Future    Standing Expiration Date:   09/18/2023    Order Specific Question:   Full PFT: includes  the following: basic spirometry, spirometry pre & post bronchodilator, diffusion capacity (DLCO), lung volumes    Answer:   Full PFT    Order Specific Question:   This test can only be performed at    Answer:   [facility]   I discussed with the patient and the implications of not treating asthma as airway remodeling can ensue.  I suspect she already has some element of airway remodeling given her prior PFTs.  She is however reluctant to take ICS/LABA daily.  She was unable to perform nitric oxide testing today.  We will reconvene in 4 to 6 months time.  Will repeat PFTs.  I have advised her that if this shows decline in function she will need to reconsider the use of ICS/LABA.  Offered albuterol inhaler for rescue however, the patient declined.   Gailen Shelter, MD Advanced Bronchoscopy PCCM  [facility]ulmonary-[place]    *This note was dictated using voice recognition software/Dragon.  Despite best efforts to proofread, errors can occur which can change the meaning. Any transcriptional errors that result from this process are unintentional and may not be fully corrected at the time of dictation.

## 2023-03-18 NOTE — Patient Instructions (Addendum)
You did well with your walking test today.  We will see him in follow-up in 4 to 6 months time with repeat breathing test at that time.  If the test continues to show that your airways are limiting flow at that time, I would recommend restarting a daily inhaler.  Please call sooner should you have any new issues with cough or shortness of breath.

## 2023-05-22 ENCOUNTER — Telehealth: Payer: Self-pay | Admitting: Cardiology

## 2023-05-22 NOTE — Telephone Encounter (Signed)
Returned pt call with Dr. Jenene Slicker advice below. Patient verbalized understanding   If the BP is OK likely would not make any changes in her meds at this point.  She can keep the October appointment.  If she starts having more low blood pressures or symptomatic low heart rates I would have to stop the Cardizem and she can let us know via MyChart if this is happening

## 2023-05-22 NOTE — Telephone Encounter (Signed)
Returned call to pt. Pt has an appointment with Dr. Antoine Poche in October and she is wanting to know if he would want to see her sooner because her HR has been running 40-50 since last night. Patient has no symptoms. At the time of the call HR is 47. She states it went into the 30's last night and once it went up to 105. Please advise.

## 2023-05-22 NOTE — Telephone Encounter (Signed)
  STAT if HR is under 50 or over 120 (normal HR is 60-100 beats per minute)  What is your heart rate? 40-50  Do you have a log of your heart rate readings (document readings)?   Do you have any other symptoms?  Pt said, for the past week her HR been fluctuating and today for the pas hour her HR staying around 40 to 45. She doesn't have any symptoms. She wants to know if she need to see Dr. Antoine Poche sooner

## 2023-07-08 NOTE — Progress Notes (Unsigned)
  Cardiology Office Note:   Date:  07/09/2023  ID:  Kristina Jordan, DOB [birth year], MRN [id number] PCP: Wilford Corner, PA-C  [facility] Providers Cardiologist:  Rollene Rotunda, MD {  History of Present Illness:   Kristina Jordan is a 72 y.o. 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   A cardiac catheterization demonstrated normal coronary arteries. Her ejection fraction was low normal at that time although it was normal on the echo in 2021.  She had mild MR.   There was moderate late systolic prolapse of the middle scallop of the posterior mitral leaflet.   She has had a TEE in the fall 2022 which suggested it to be moderate to moderately severe.  Her LV function was somewhat low normal.  Her echo March 2024 demonstrated an LVEF of 57%.  MR is reported to be moderate.     Since I last saw her she has had no new cardiovascular problems.  She vacuums and does her household chores.  She occasionally rides a lawnmower and even trims a little bit and says she is not getting any cardiovascular symptoms.   The patient denies any new symptoms such as chest discomfort, neck or arm discomfort. There has been no new shortness of breath, PND or orthopnea. There have been no reported palpitations, presyncope or syncope.    ROS: As stated in the HPI and negative for all other systems.   Studies Reviewed:    EKG:   EKG Interpretation Date/Time:  Thursday July 09 2023 10:56:42 EDT Ventricular Rate:  66 PR Interval:  194 QRS Duration:  88 QT Interval:  392 QTC Calculation: 410 R Axis:   65  Text Interpretation: Normal sinus rhythm Normal ECG When compared with ECG of 31-Jul-2020 09:36, Premature atrial complexes are no longer Present Confirmed by Rollene Rotunda ([id number]) on 07/09/2023 11:29:17 AM   Risk Assessment/Calculations:              Physical Exam:   VS:  BP 102/68   Pulse 66   Ht 5\' 5"  (1.651 m)   Wt 104 lb 3.2 oz (47.3 kg)    SpO2 98%   BMI 17.34 kg/m    Wt Readings from Last 3 Encounters:  07/09/23 104 lb 3.2 oz (47.3 kg)  03/18/23 104 lb (47.2 kg)  12/26/22 104 lb 12.8 oz (47.5 kg)     GEN: Well nourished, well developed in no acute distress NECK: No JVD; No carotid bruits CARDIAC: RRR, 3 out of 6 late systolic murmur radiating to the axilla, no diastolic murmurs, rubs, gallops RESPIRATORY:  Clear to auscultation without rales, wheezing or rhonchi  ABDOMEN: Soft, non-tender, non-distended EXTREMITIES:  No edema; No deformity   ASSESSMENT AND PLAN:   MITRAL VALVE PROLAPSE -  She has mitral valve prolapse with moderate to moderately severe MR. I will follow this with another transthoracic echo in March.  She has no symptoms.   CARDIOMYOPATHY Her EF was preserved on the last echo.  I will follow this as above.   PULM HTN - This was mildly elevated on the last echo and will be followed as above.     Follow up with me in one year.    Signed, Rollene Rotunda, MD

## 2023-07-09 ENCOUNTER — Ambulatory Visit: Payer: Medicare HMO | Attending: Cardiology | Admitting: Cardiology

## 2023-07-09 ENCOUNTER — Encounter: Payer: Self-pay | Admitting: Cardiology

## 2023-07-09 VITALS — BP 102/68 | HR 66 | Ht 65.0 in | Wt 104.2 lb

## 2023-07-09 DIAGNOSIS — I420 Dilated cardiomyopathy: Secondary | ICD-10-CM | POA: Diagnosis not present

## 2023-07-09 DIAGNOSIS — I341 Nonrheumatic mitral (valve) prolapse: Secondary | ICD-10-CM

## 2023-07-09 NOTE — Patient Instructions (Signed)
  Testing/Procedures: Your physician has requested that you have an echocardiogram. Echocardiography is a painless test that uses sound waves to create images of your heart. It provides your doctor with information about the size and shape of your heart and how well your heart's chambers and valves are working. This procedure takes approximately one hour. There are no restrictions for this procedure. To be done at [facility] in March of 2025. Please do NOT wear cologne, perfume, aftershave, or lotions (deodorant is allowed). Please arrive 15 minutes prior to your appointment time.    Follow-Up: At [facility], you and your health needs are our priority.  As part of our continuing mission to provide you with exceptional heart care, we have created designated Provider Care Teams.  These Care Teams include your primary Cardiologist (physician) and Advanced Practice Providers (APPs -  Physician Assistants and Nurse Practitioners) who all work together to provide you with the care you need, when you need it.  We recommend signing up for the patient portal called "MyChart".  Sign up information is provided on this After Visit Summary.  MyChart is used to connect with patients for Virtual Visits (Telemedicine).  Patients are able to view lab/test results, encounter notes, upcoming appointments, etc.  Non-urgent messages can be sent to your provider as well.   To learn more about what you can do with MyChart, go to [email or web address].    Your next appointment:   12 month(s)  Provider:   Rollene Rotunda, MD

## 2023-07-27 ENCOUNTER — Ambulatory Visit: Payer: Medicare HMO

## 2023-08-06 ENCOUNTER — Ambulatory Visit: Payer: Medicare HMO | Attending: Pulmonary Disease

## 2023-08-06 DIAGNOSIS — J4540 Moderate persistent asthma, uncomplicated: Secondary | ICD-10-CM | POA: Diagnosis present

## 2023-08-06 LAB — PULMONARY FUNCTION TEST ARMC ONLY
DL/VA % pred: 95 %
DL/VA: 3.89 ml/min/mmHg/L
DLCO unc % pred: 83 %
DLCO unc: 16.63 ml/min/mmHg
FEF 25-75 Post: 1.06 L/s
FEF 25-75 Pre: 0.92 L/s
FEF2575-%Change-Post: 15 %
FEF2575-%Pred-Post: 60 %
FEF2575-%Pred-Pre: 51 %
FEV1-%Change-Post: 3 %
FEV1-%Pred-Post: 70 %
FEV1-%Pred-Pre: 67 %
FEV1-Post: 1.57 L
FEV1-Pre: 1.52 L
FEV1FVC-%Change-Post: 3 %
FEV1FVC-%Pred-Pre: 90 %
FEV6-%Change-Post: 0 %
FEV6-%Pred-Post: 78 %
FEV6-%Pred-Pre: 78 %
FEV6-Post: 2.23 L
FEV6-Pre: 2.22 L
FEV6FVC-%Pred-Post: 104 %
FEV6FVC-%Pred-Pre: 104 %
FVC-%Change-Post: 0 %
FVC-%Pred-Post: 74 %
FVC-%Pred-Pre: 74 %
FVC-Post: 2.23 L
FVC-Pre: 2.22 L
Post FEV1/FVC ratio: 70 %
Post FEV6/FVC ratio: 100 %
Pre FEV1/FVC ratio: 68 %
Pre FEV6/FVC Ratio: 100 %
RV % pred: 142 %
RV: 3.3 L
TLC % pred: 105 %
TLC: 5.5 L

## 2023-08-06 MED ORDER — ALBUTEROL SULFATE (2.5 MG/3ML) 0.083% IN NEBU
2.5000 mg | INHALATION_SOLUTION | Freq: Once | RESPIRATORY_TRACT | Status: AC
  Administered 2023-08-06: 2.5 mg via RESPIRATORY_TRACT
  Filled 2023-08-06: qty 3

## 2023-08-11 ENCOUNTER — Other Ambulatory Visit: Payer: Self-pay | Admitting: Family Medicine

## 2023-08-11 DIAGNOSIS — Z1231 Encounter for screening mammogram for malignant neoplasm of breast: Secondary | ICD-10-CM

## 2023-08-12 ENCOUNTER — Ambulatory Visit: Payer: Medicare HMO | Admitting: Pulmonary Disease

## 2023-08-12 ENCOUNTER — Encounter: Payer: Self-pay | Admitting: Pulmonary Disease

## 2023-08-12 VITALS — BP 120/70 | HR 72 | Temp 97.1°F | Ht 65.0 in | Wt 103.8 lb

## 2023-08-12 DIAGNOSIS — I340 Nonrheumatic mitral (valve) insufficiency: Secondary | ICD-10-CM | POA: Diagnosis not present

## 2023-08-12 DIAGNOSIS — R053 Chronic cough: Secondary | ICD-10-CM

## 2023-08-12 DIAGNOSIS — J4530 Mild persistent asthma, uncomplicated: Secondary | ICD-10-CM

## 2023-08-12 DIAGNOSIS — R918 Other nonspecific abnormal finding of lung field: Secondary | ICD-10-CM | POA: Diagnosis not present

## 2023-08-12 MED ORDER — ALBUTEROL SULFATE HFA 108 (90 BASE) MCG/ACT IN AERS: 2.0000 | INHALATION_SPRAY | Freq: Four times a day (QID) | RESPIRATORY_TRACT | 2 refills | Status: AC | PRN

## 2023-08-12 NOTE — Progress Notes (Signed)
Subjective:    Patient ID: Kristina Jordan, female    DOB: [birth year], 72 y.o.   MRN: [id number]  Patient Care Team: Wilford Corner, PA-C as PCP - General (Family Medicine) Rollene Rotunda, MD as PCP - Cardiology (Cardiology) Dingeldein, Viviann Spare, MD as Consulting Physician (Ophthalmology) Dasher, Cliffton Asters, MD (Dermatology) Salena Saner, MD as Consulting Physician (Pulmonary Disease) Rollene Rotunda, MD as Consulting Physician (Cardiology)  Chief Complaint  Patient presents with   Follow-up    No SOB, no wheezing, occ. cough with clear sputum    BACKGROUND/INTERVAL:Kristina Jordan is a 72 year old lifelong never smoker who presents for follow-up of pulmonary nodules incidentally and initially found on a CT scan of the chest performed on 31 July 2020.  The patient was initially seen here in January 2022 for the issue of the nodules.  The patient was noted to have stable findings and she was set for a follow-up with a CT in a year.  She then follow-up CT chest in July 2023 that showed stability of the nodules and likely benign etiology given the stability of over 2 years.  Patient also has mild to moderate persistent asthma but is reluctant to use maintenance inhalers.  HPI  Discussed the use of AI scribe software for clinical note transcription with the patient, who gave verbal consent to proceed.  History of Present Illness    The patient, with a known diagnosis of mild to moderate persistent asthma, presents with a persistent cough described as a 'hacky cough.' The cough is intermittent and has been present for a long duration. The patient denies any associated shortness of breath.  She believes that while she was taking Trelegy regularly this was better however, she requested to come off Trelegy previously and prefers not to be on maintenance inhalers.  I explained to her the connection between asthma and her cough.  The patient's last pulmonary function test showed no  decline from the previous one, indicating stable lung function. However, she has been informed of the presence of some asthma, which may necessitate ongoing control with medication to prevent airway inflammation and potential deterioration of lung function.  The patient also mentions having received a flu shot . She has been followed for small nodules in the lung, which have been confirmed as non-cancerous.      Review of Systems A 10 point review of systems was performed and it is as noted above otherwise negative.   Patient Active Problem List   Diagnosis Date Noted   Moderate persistent asthma without complication 08/06/2023   Nonrheumatic mitral valve regurgitation 12/25/2022   Weight loss 12/25/2022   Atrial septal aneurysm    Dilated cardiomyopathy (HCC) 11/08/2020   Ganglion cyst of flexor tendon sheath of finger 12/30/2016   Hypercholesterolemia without hypertriglyceridemia 11/19/2015   Avitaminosis D 11/19/2015   Brachial neuritis 04/16/2010   MITRAL INSUFFICIENCY 12/26/2009   MVP (mitral valve prolapse) 12/24/2009   FIBRILLATION, ATRIAL 12/24/2009   Heart disease 11/01/2009   Leg varices 03/19/2009   Acquired trigger finger 01/02/2009   Need for prophylactic hormone replacement therapy (postmenopausal) 10/09/2006   OP (osteoporosis) 10/09/2006    Social History   Tobacco Use   Smoking status: Former    Types: Cigarettes   Smokeless tobacco: Never   Tobacco comments:    only socially in high school.   Substance Use Topics   Alcohol use: No    Allergies  Allergen Reactions   Sulfonamide Derivatives Rash    Current  Meds  Medication Sig   albuterol (VENTOLIN HFA) 108 (90 Base) MCG/ACT inhaler Inhale 2 puffs into the lungs every 6 (six) hours as needed for wheezing or shortness of breath.   alendronate (FOSAMAX) 70 MG tablet TAKE 1 TABLET BY MOUTH ONCE A WEEK WITH  A  FULL  GLASS  OF  WATER  ON  AN  EMPTY  STOMACH.  DO  NOT  LIE  DOWN  UNTIL  AFTER  BREAKFAST    aspirin 81 MG tablet Take 81 mg by mouth daily.   calcium carbonate (OSCAL) 1500 (600 Ca) MG TABS tablet Take 1,200 mg by mouth daily with breakfast.   Cholecalciferol (VITAMIN D) 50 MCG (2000 UT) tablet Take 4,000 Units by mouth daily.   diltiazem (CARDIZEM) 120 MG tablet Take 1 tablet (120 mg total) by mouth daily.   propranolol (INDERAL) 40 MG tablet Take 1 tablet (40 mg total) by mouth 2 (two) times daily.   Propylene Glycol (SYSTANE COMPLETE) 0.6 % SOLN Place 1 drop into both eyes daily as needed (dry eyes).    Immunization History  Administered Date(s) Administered   Fluad Quad(high Dose 65+) 06/29/2019, 07/20/2020, 07/22/2021   H1N1 10/12/2008   Influenza Inj Mdck Quad Pf 07/30/2022   Influenza, High Dose Seasonal PF 07/31/2014, 08/02/2015, 07/18/2016, 08/04/2017, 07/14/2018   Influenza, Mdck, Trivalent,PF 6+ MOS(egg free) 08/03/2023   Pneumococcal Conjugate-13 11/17/2014   Pneumococcal Polysaccharide-23 06/02/2011, 11/07/2016   Tdap 10/21/2010, 07/29/2021   Zoster, Live 10/27/2011        Objective:     BP 120/70 (BP Location: Right Arm, Cuff Size: Normal)   Pulse 72   Temp (!) 97.1 F (36.2 C)   Ht 5\' 5"  (1.651 m)   Wt 103 lb 12.8 oz (47.1 kg)   SpO2 97%   BMI 17.27 kg/m   SpO2: 97 % O2 Device: None (Room air)  GENERAL: Slender, well-developed woman in no acute respiratory distress.  Fully ambulatory. HEAD: Normocephalic, atraumatic. EYES: Pupils equal, round, reactive to light.  No scleral icterus. MOUTH: Dentition intact.  No thrush. NECK: Supple. No thyromegaly. Trachea midline. No JVD.  No adenopathy. PULMONARY: Good air entry bilaterally.  No adventitious sounds. CARDIOVASCULAR: S1 and S2. Regular rate and rhythm.  Grade 2/6 mitral regurgitation murmur, no other murmurs or extra heart sounds. ABDOMEN: Benign. MUSCULOSKELETAL: No joint deformity, no clubbing, no edema. NEUROLOGIC: No focal deficits, no gait disturbance, speech is fluent. SKIN:  Intact,warm,dry. PSYCH: Mood and behavior normal.   Attempted nitric oxide determination however, patient could not perform maneuver.  Assessment & Plan:     ICD-10-CM   1. Mild persistent asthma without complication  J45.30    Mild to moderate in degree    2. Chronic cough  R05.3    Suspect related to asthma Patient declines maintenance inhaler    3. Mitral valve insufficiency, unspecified etiology  I34.0     4. Multiple lung nodules on CT- BENIGN  R91.8       Meds ordered this encounter  Medications   albuterol (VENTOLIN HFA) 108 (90 Base) MCG/ACT inhaler    Sig: Inhale 2 puffs into the lungs every 6 (six) hours as needed for wheezing or shortness of breath.    Dispense:  8 g    Refill:  2   Discussion:    Mild to Moderate Persistent Asthma Mild to moderate persistent asthma with chronic cough. Reports intermittent 'hacky cough' and non-adherence to inhaler. Pulmonary function tests show stable lung function. No dyspnea  reported. Aim to manage airway inflammation with the mildest effective treatment to prevent lung deterioration. Discussed as-needed albuterol inhaler for worsening cough or wheezing. Explained potential need for daily medication if symptoms persist. - Prescribe albuterol inhaler as needed for worsening cough or wheezing - Check airway inflammation level, patient was unable to perform nitric oxide test - Schedule follow-up in six months - Advise to call if symptoms worsen or wheezing occurs  General Health Maintenance Received flu shot. Unclear if RSV vaccine was administered correctly. - Verify RSV vaccination status.      Gailen Shelter, MD Advanced Bronchoscopy PCCM [facility]    *This note was generated using voice recognition software/Dragon and/or AI transcription program.  Despite best efforts to proofread, errors can occur which can change the meaning. Any transcriptional errors that result from this process are  unintentional and may not be fully corrected at the time of dictation.

## 2023-08-12 NOTE — Patient Instructions (Signed)
VISIT SUMMARY:  During today's visit, we discussed your mild persistent asthma and the persistent cough you have been experiencing. We reviewed your recent pulmonary function test results, which show stable lung function. We also talked about your previous use of an inhaler and the need for ongoing management of your asthma. Additionally, we confirmed that you received a flu shot and discussed the need to verify your RSV vaccination status.  YOUR PLAN:  -MILD PERSISTENT ASTHMA: Mild persistent asthma is a condition where the airways in your lungs are inflamed and can cause symptoms like coughing and wheezing. To manage your asthma and prevent lung deterioration, we have prescribed an albuterol inhaler to use as needed for worsening cough or wheezing. We will also check your airway inflammation level and schedule a follow-up appointment in six months. Please call us if your symptoms worsen or if you experience wheezing.  -GENERAL HEALTH MAINTENANCE: You have received a flu shot, which helps protect against the influenza virus. We need to verify whether you have received the RSV vaccine, which protects against respiratory syncytial virus. Keeping up with vaccinations is important for your overall health.  INSTRUCTIONS:  Please use the albuterol inhaler as needed for worsening cough or wheezing. We will check your airway inflammation level and schedule a follow-up appointment in six months. Call us if your symptoms worsen or if you experience wheezing. Additionally, we need to verify your RSV vaccination status.

## 2023-09-03 ENCOUNTER — Ambulatory Visit
Admission: RE | Admit: 2023-09-03 | Discharge: 2023-09-03 | Disposition: A | Discharge status: Home / Self Care | Payer: Medicare HMO | Source: Ambulatory Visit | Attending: Family Medicine | Admitting: Family Medicine

## 2023-09-03 DIAGNOSIS — Z1231 Encounter for screening mammogram for malignant neoplasm of breast: Secondary | ICD-10-CM | POA: Diagnosis present

## 2023-11-11 ENCOUNTER — Telehealth: Payer: Self-pay | Admitting: Cardiology

## 2023-11-11 NOTE — Telephone Encounter (Signed)
 Pt c/o medication issue:  1. Name of Medication:   diltiazem (CARDIZEM) 120 MG tablet   2. How are you currently taking this medication (dosage and times per day)?   As prescribed  3. Are you having a reaction (difficulty breathing--STAT)?   4. What is your medication issue?   Patient stated she takes this medication and she wants to know if she can use a nasal spray Kristina Jordan) which contains grapefruit seed extract.

## 2023-11-11 NOTE — Telephone Encounter (Signed)
 Pavero, Christopher, RPH  You1 minute ago (2:23 PM)    Yes, should be fine   Patient identification verified by 2 forms. Marilynn Rail, RN    Called and spoke to patient  Relayed pharmacy message  Patient verbalized understanding, no questions at this time

## 2023-11-16 ENCOUNTER — Ambulatory Visit (HOSPITAL_COMMUNITY): Payer: Medicare HMO | Attending: Cardiology

## 2023-11-16 DIAGNOSIS — I341 Nonrheumatic mitral (valve) prolapse: Secondary | ICD-10-CM

## 2023-11-16 LAB — ECHOCARDIOGRAM COMPLETE
Area-P 1/2: 1.93 cm2
MV M vel: 5.45 m/s
MV Peak grad: 118.8 mmHg
Radius: 0.7 cm
S' Lateral: 3 cm

## 2024-02-04 NOTE — Progress Notes (Signed)
  Cardiology Office Note:   Date:  02/05/2024  ID:  Kristina Jordan, DOB 1951-07-01, MRN [id number] PCP: Lenell Query, PA-C  [facility] Providers Cardiologist:  Eilleen Grates, MD {  History of Present Illness:   Kristina Jordan is a 73 y.o. 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   A cardiac catheterization demonstrated normal coronary arteries. Her ejection fraction was low normal at that time although it was normal on the echo in 2021.  She had mild MR.   There was moderate late systolic prolapse of the middle scallop of the posterior mitral leaflet.   She has had a TEE in the fall 2022 which suggested it to be moderate to moderately severe.  Her LV function was somewhat low normal.  Her echo March 2024 demonstrated an LVEF of 57%.  MR is reported to be severe.     Since I last saw her she has had no new cardiovascular complaints.  She still stays very active in the yard and doing walking.  She denies any cardiovascular symptoms. The patient denies any new symptoms such as chest discomfort, neck or arm discomfort. There has been no new shortness of breath, PND or orthopnea. There have been no reported palpitations, presyncope or syncope.   Although the echo does suggest that the regurgitation is severe with moderate late systolic prolapse of both leaflets.  She does not have worsening LV function.  There is no evidence of pulmonary hypertension.  She has no new symptoms.  ROS: As stated in the HPI and negative for all other systems.  Studies Reviewed:    EKG:    07/09/2023 sinus rhythm, rate 66, axis within normal limits, intervals within normal limits, no acute ST-T wave changes.  Risk Assessment/Calculations:              Physical Exam:   VS:  BP (!) 108/58   Pulse 63   Ht 5\' 5"  (1.651 m)   Wt 103 lb 12.8 oz (47.1 kg)   SpO2 98%   BMI 17.27 kg/m    Wt Readings from Last 3 Encounters:  02/05/24 103 lb 12.8 oz (47.1 kg)   08/12/23 103 lb 12.8 oz (47.1 kg)  07/09/23 104 lb 3.2 oz (47.3 kg)     GEN: Well nourished, well developed in no acute distress NECK: No JVD; No carotid bruits CARDIAC: RRR, late systolic murmur heard best in the left lateral position, no diastolic murmurs, rubs, gallops RESPIRATORY:  Clear to auscultation without rales, wheezing or rhonchi  ABDOMEN: Soft, non-tender, non-distended EXTREMITIES:  No edema; No deformity   ASSESSMENT AND PLAN:   MITRAL VALVE PROLAPSE -  She has severe MR on echo in March.  However, she has no symptoms and no change in her LV function and I still think we can follow this clinically for a little while.  I will repeat a transthoracic echo in 6 months.  We had a long conversation with this and I reviewed the anatomy using a heart model.   PULM HTN - This was normal on last echo.  Is having mention previously.  I will follow this clinically with repeat echoes as above   Follow up with me after the echo in October.  Signed, Eilleen Grates, MD

## 2024-02-05 ENCOUNTER — Encounter: Payer: Self-pay | Admitting: Cardiology

## 2024-02-05 ENCOUNTER — Ambulatory Visit: Attending: Cardiology | Admitting: Cardiology

## 2024-02-05 VITALS — BP 108/58 | HR 63 | Ht 65.0 in | Wt 103.8 lb

## 2024-02-05 DIAGNOSIS — I341 Nonrheumatic mitral (valve) prolapse: Secondary | ICD-10-CM | POA: Diagnosis not present

## 2024-02-05 DIAGNOSIS — I420 Dilated cardiomyopathy: Secondary | ICD-10-CM

## 2024-02-05 NOTE — Patient Instructions (Signed)
 Medication Instructions:  The current medical regimen is effective;  continue present plan and medications.  *If you need a refill on your cardiac medications before your next appointment, please call your pharmacy*  Testing/Procedures: Your physician has requested that you have an echocardiogram in October. Echocardiography is a painless test that uses sound waves to create images of your heart. It provides your doctor with information about the size and shape of your heart and how well your heart's chambers and valves are working. This procedure takes approximately one hour. There are no restrictions for this procedure. Please do NOT wear cologne, perfume, aftershave, or lotions (deodorant is allowed). Please arrive 15 minutes prior to your appointment time.  Please note: We ask at that you not bring children with you during ultrasound (echo/ vascular) testing. Due to room size and safety concerns, children are not allowed in the ultrasound rooms during exams. Our front office staff cannot provide observation of children in our lobby area while testing is being conducted. An adult accompanying a patient to their appointment will only be allowed in the ultrasound room at the discretion of the ultrasound technician under special circumstances. We apologize for any inconvenience.  Follow-Up: At [facility], you and your health needs are our priority.  As part of our continuing mission to provide you with exceptional heart care, our providers are all part of one team.  This team includes your primary Cardiologist (physician) and Advanced Practice Providers or APPs (Physician Assistants and Nurse Practitioners) who all work together to provide you with the care you need, when you need it.  Your next appointment:   6 month(s)  Provider:   Eilleen Grates, MD    We recommend signing up for the patient portal called "MyChart".  Sign up information is provided on this After Visit Summary.   MyChart is used to connect with patients for Virtual Visits (Telemedicine).  Patients are able to view lab/test results, encounter notes, upcoming appointments, etc.  Non-urgent messages can be sent to your provider as well.   To learn more about what you can do with MyChart, go to [email or web address].

## 2024-04-14 ENCOUNTER — Encounter: Payer: Self-pay | Admitting: Pulmonary Disease

## 2024-04-14 ENCOUNTER — Ambulatory Visit: Admitting: Pulmonary Disease

## 2024-04-14 VITALS — BP 110/60 | HR 60 | Temp 97.5°F | Ht 65.0 in | Wt 107.4 lb

## 2024-04-14 DIAGNOSIS — R918 Other nonspecific abnormal finding of lung field: Secondary | ICD-10-CM | POA: Diagnosis not present

## 2024-04-14 DIAGNOSIS — J4530 Mild persistent asthma, uncomplicated: Secondary | ICD-10-CM

## 2024-04-14 DIAGNOSIS — I340 Nonrheumatic mitral (valve) insufficiency: Secondary | ICD-10-CM

## 2024-04-14 NOTE — Progress Notes (Unsigned)
 Subjective:    Patient ID: Kristina Jordan, female    DOB: Apr 26, 1951, 73 y.o.   MRN: [id number]  Patient Care Team: Cyrus Selinda Moose, PA-C as PCP - General (Family Medicine) Lavona Agent, MD as PCP - Cardiology (Cardiology) Dingeldein, Elspeth, MD as Consulting Physician (Ophthalmology) Dasher, Alm LABOR, MD (Dermatology) Tamea Dedra CROME, MD as Consulting Physician (Pulmonary Disease) Lavona Agent, MD as Consulting Physician (Cardiology)  Chief Complaint  Patient presents with   Follow-up    No SOB, wheezing or cough.     BACKGROUND/INTERVAL:  HPI Discussed the use of AI scribe software for clinical note transcription with the patient, who gave verbal consent to proceed.  History of Present Illness   Kristina Jordan is a 73 year old female with mild intermittent asthma and benign lung nodules who presents for follow-up.  She has mild intermittent asthma and has not used her albuterol  inhaler recently. She is not on any other inhalers. She does not feel short of breath, although she acknowledges that she might not notice it much if it occurs. She states, 'I guess anybody gets short of breath sometimes,' but does not pay much attention to it.  She has a history of lung nodules which have deemed to be benign as they have been stable.   She has significant mitral regurgitation and is followed by cardiology.  Prior PFTs showed moderate obstruction but the patient has been reluctant to be on a maintenance inhaler.  Attempted nitric oxide  determination today but the patient could not perform.  She does not endorse any other symptomatology.  Overall she feels well and looks well.       Review of Systems A 10 point review of systems was performed and it is as noted above otherwise negative.   Patient Active Problem List   Diagnosis Date Noted   Moderate persistent asthma without complication 08/06/2023   Nonrheumatic mitral valve regurgitation 12/25/2022   Weight loss  12/25/2022   Atrial septal aneurysm    Dilated cardiomyopathy (HCC) 11/08/2020   Ganglion cyst of flexor tendon sheath of finger 12/30/2016   Hypercholesterolemia without hypertriglyceridemia 11/19/2015   Avitaminosis D 11/19/2015   Brachial neuritis 04/16/2010   MITRAL INSUFFICIENCY 12/26/2009   MVP (mitral valve prolapse) 12/24/2009   FIBRILLATION, ATRIAL 12/24/2009   Heart disease 11/01/2009   Leg varices 03/19/2009   Acquired trigger finger 01/02/2009   Need for prophylactic hormone replacement therapy (postmenopausal) 10/09/2006   OP (osteoporosis) 10/09/2006    Social History   Tobacco Use   Smoking status: Former    Types: Cigarettes   Smokeless tobacco: Never   Tobacco comments:    only socially in high school.   Substance Use Topics   Alcohol use: No    Allergies  Allergen Reactions   Sulfonamide Derivatives Rash    Current Meds  Medication Sig   albuterol  (VENTOLIN  HFA) 108 (90 Base) MCG/ACT inhaler Inhale 2 puffs into the lungs every 6 (six) hours as needed for wheezing or shortness of breath.   alendronate  (FOSAMAX ) 70 MG tablet TAKE 1 TABLET BY MOUTH ONCE A WEEK WITH  A  FULL  GLASS  OF  WATER  ON  AN  EMPTY  STOMACH.  DO  NOT  LIE  DOWN  UNTIL  AFTER  BREAKFAST   aspirin  81 MG tablet Take 81 mg by mouth daily.   calcium  carbonate (OSCAL) 1500 (600 Ca) MG TABS tablet Take 1,200 mg by mouth daily with breakfast.  Cholecalciferol  (VITAMIN D ) 50 MCG (2000 UT) tablet Take 4,000 Units by mouth daily.   diltiazem  (CARDIZEM ) 120 MG tablet Take 1 tablet (120 mg total) by mouth daily.   propranolol  (INDERAL ) 40 MG tablet Take 1 tablet (40 mg total) by mouth 2 (two) times daily.   Propylene Glycol (SYSTANE COMPLETE) 0.6 % SOLN Place 1 drop into both eyes daily as needed (dry eyes).    Immunization History  Administered Date(s) Administered   Fluad Quad(high Dose 65+) 06/29/2019, 07/20/2020, 07/22/2021   H1N1 10/12/2008   Influenza Inj Mdck Quad Pf 07/30/2022    Influenza, High Dose Seasonal PF 07/31/2014, 08/02/2015, 07/18/2016, 08/04/2017, 07/14/2018   Influenza, Mdck, Trivalent,PF 6+ MOS(egg free) 08/03/2023   Pneumococcal Conjugate-13 11/17/2014   Pneumococcal Polysaccharide-23 06/02/2011, 11/07/2016   Tdap 10/21/2010, 07/29/2021   Zoster, Live 10/27/2011        Objective:     BP 110/60 (BP Location: Right Arm, Cuff Size: Normal)   Pulse 60   Temp (!) 97.5 F (36.4 C)   Ht 5' 5 (1.651 m)   Wt 107 lb 6.4 oz (48.7 kg)   SpO2 100%   BMI 17.87 kg/m   SpO2: 100 % O2 Device: None (Room air)  GENERAL: Slender, well-developed woman in no acute respiratory distress.  Fully ambulatory. HEAD: Normocephalic, atraumatic. EYES: Pupils equal, round, reactive to light.  No scleral icterus. MOUTH: Dentition intact.  No thrush. NECK: Supple. No thyromegaly. Trachea midline. No JVD.  No adenopathy. PULMONARY: Good air entry bilaterally.  No adventitious sounds. CARDIOVASCULAR: S1 and S2. Regular rate and rhythm.  Grade 2/6 mitral regurgitation murmur, no other murmurs or extra heart sounds. ABDOMEN: Benign. MUSCULOSKELETAL: No joint deformity, no clubbing, no edema. NEUROLOGIC: No focal deficits, no gait disturbance, speech is fluent. SKIN: Intact,warm,dry. PSYCH: Mood and behavior normal.       Assessment & Plan:     ICD-10-CM   1. Mild persistent asthma without complication  J45.30 Pulmonary function test    2. Mitral valve insufficiency, unspecified etiology  I34.0     3. Multiple lung nodules on CT- BENIGN  R91.8       Orders Placed This Encounter  Procedures   Pulmonary function test    Standing Status:   Future    Expiration Date:   04/14/2025    Where should this test be performed?:   Outpatient Pulmonary    What type of PFT is being ordered?:   Full PFT   Discussion:    Mild intermittent asthma Mild intermittent asthma with no recent albuterol  use and no significant dyspnea. Nitric oxide  test for airway inflammation  was not performed as the patient could not perform the maneuver. - Schedule pulmonary function tests (PFTs) - Follow up in six months - Instruct to contact if new difficulties arise  Benign lung nodules Lung nodules deemed benign after multiple chest CTs showed stability.      Advised if symptoms do not improve or worsen, to please contact office for sooner follow up or seek emergency care.    I spent 30 minutes of dedicated to the care of this patient on the date of this encounter to include pre-visit review of records, face-to-face time with the patient discussing conditions above, post visit ordering of testing, clinical documentation with the electronic health record, making appropriate referrals as documented, and communicating necessary findings to members of the patients care team.     C. Leita Sanders, MD Advanced Bronchoscopy PCCM [facility]    *This  note was generated using voice recognition software/Dragon and/or AI transcription program.  Despite best efforts to proofread, errors can occur which can change the meaning. Any transcriptional errors that result from this process are unintentional and may not be fully corrected at the time of dictation.

## 2024-04-14 NOTE — Patient Instructions (Signed)
 VISIT SUMMARY:  Today, you came in for a follow-up visit regarding your mild intermittent asthma and benign lung nodules. You reported that you have not used your albuterol  inhaler recently and have not experienced significant shortness of breath.  YOUR PLAN:  -MILD INTERMITTENT ASTHMA: Mild intermittent asthma is a condition where the airways occasionally become inflamed, causing breathing difficulties. You have not used your albuterol  inhaler recently and have not experienced significant shortness of breath. We will schedule pulmonary function tests (PFTs) to assess your lung function. Please follow up in six months and contact us  if you experience any new difficulties.  -BENIGN LUNG NODULES: Benign lung nodules are small, non-cancerous growths in the lungs. No changes or new treatments are needed at this time.  INSTRUCTIONS:  Please schedule pulmonary function tests (PFTs) and follow up in six months. Contact us  if you experience any new difficulties.

## 2024-04-15 ENCOUNTER — Encounter: Payer: Self-pay | Admitting: Pulmonary Disease

## 2024-07-11 ENCOUNTER — Ambulatory Visit (HOSPITAL_COMMUNITY)
Admission: RE | Admit: 2024-07-11 | Discharge: 2024-07-11 | Disposition: A | Discharge status: Home / Self Care | Source: Ambulatory Visit | Attending: Cardiology | Admitting: Cardiology

## 2024-07-11 DIAGNOSIS — I420 Dilated cardiomyopathy: Secondary | ICD-10-CM | POA: Insufficient documentation

## 2024-07-11 DIAGNOSIS — I341 Nonrheumatic mitral (valve) prolapse: Secondary | ICD-10-CM | POA: Insufficient documentation

## 2024-07-12 LAB — ECHOCARDIOGRAM COMPLETE
Area-P 1/2: 1.72 cm2
MV M vel: 5.34 m/s
MV Peak grad: 114.1 mmHg
Radius: 0.65 cm
S' Lateral: 3 cm

## 2024-07-17 ENCOUNTER — Ambulatory Visit: Payer: Self-pay | Admitting: Cardiology

## 2024-08-05 ENCOUNTER — Other Ambulatory Visit: Payer: Self-pay | Admitting: Family Medicine

## 2024-08-05 DIAGNOSIS — Z1231 Encounter for screening mammogram for malignant neoplasm of breast: Secondary | ICD-10-CM

## 2024-08-09 DIAGNOSIS — I272 Pulmonary hypertension, unspecified: Secondary | ICD-10-CM | POA: Insufficient documentation

## 2024-08-09 NOTE — Progress Notes (Unsigned)
  Cardiology Office Note:   Date:  08/10/2024  ID:  Kristina Jordan, DOB [birth year], MRN [id number] PCP: Cyrus Selinda Moose, PA-C  [facility] Providers Cardiologist:  Lynwood Schilling, MD {  History of Present Illness:   Kristina Jordan is a 73 y.o. female  for followup after a non-Q-wave myocardial infarction in 2012.   A cardiac catheterization demonstrated normal coronary arteries. Her ejection fraction was low normal at that time although it was normal on the echo in 2021.  She had mild MR.   There was moderate late systolic prolapse of the middle scallop of the posterior mitral leaflet.   She has had a TEE in the fall 2022 which suggested it to be moderate to moderately severe.  Her LV function was somewhat low normal.  Her echo March 2024 demonstrated an LVEF of 57%.  MR is reported to be severe.     Since I last saw her she has had no new cardiovascular complaints.  She works in the yard sometimes pushing a hospital doctor.  She vacuums routinely. The patient denies any new symptoms such as chest discomfort, neck or arm discomfort. There has been no new shortness of breath, PND or orthopnea. There have been no reported palpitations, presyncope or syncope.  Her husband says she never sits down.   ROS: As stated in the HPI and negative for all other systems.  Studies Reviewed:    EKG:   EKG Interpretation Date/Time:  Wednesday August 10 2024 11:07:13 EST Ventricular Rate:  63 PR Interval:  188 QRS Duration:  86 QT Interval:  394 QTC Calculation: 403 R Axis:   59  Text Interpretation: Sinus rhythm with Premature atrial complexes When compared with ECG of 09-Jul-2023 10:56, Premature atrial complexes are now Present Confirmed by Schilling Lynwood ([id number]) on 08/10/2024 11:47:40 AM   Risk Assessment/Calculations:              Physical Exam:   VS:  BP 112/70 (BP Location: Left Arm, Patient Position: Sitting, Cuff Size: Normal)   Pulse 63   Ht 5' 5 (1.651 m)   Wt 107 lb 8 oz  (48.8 kg)   SpO2 98%   BMI 17.89 kg/m    Wt Readings from Last 3 Encounters:  08/10/24 107 lb 8 oz (48.8 kg)  04/14/24 107 lb 6.4 oz (48.7 kg)  02/05/24 103 lb 12.8 oz (47.1 kg)     GEN: Well nourished, well developed in no acute distress NECK: No JVD; No carotid bruits CARDIAC: RRR, 2 out of 6 apical systolic murmur slightly radiating to the axilla, no diastolic murmurs.  Murmurs, rubs, gallops RESPIRATORY:  Clear to auscultation without rales, wheezing or rhonchi  ABDOMEN: Soft, non-tender, non-distended EXTREMITIES:  No edema; No deformity   ASSESSMENT AND PLAN:   MITRAL VALVE PROLAPSE -  This was moderate to severe on 07/17/2024.  I am and follows very closely with another 33-month echo.  She has no class I indication for valve intervention at this..  We talked about this at length.  I reviewed the anatomy with her previously with a heart model.   PULM HTN - This was normal on last echo.  I will follow this with repeat echoes as above.  Follow up with me in 6 months after the next echo  Signed, Lynwood Schilling, MD

## 2024-08-10 ENCOUNTER — Encounter: Payer: Self-pay | Admitting: Cardiology

## 2024-08-10 ENCOUNTER — Ambulatory Visit: Attending: Internal Medicine | Admitting: Cardiology

## 2024-08-10 VITALS — BP 112/70 | HR 63 | Ht 65.0 in | Wt 107.5 lb

## 2024-08-10 DIAGNOSIS — I341 Nonrheumatic mitral (valve) prolapse: Secondary | ICD-10-CM

## 2024-08-10 DIAGNOSIS — I272 Pulmonary hypertension, unspecified: Secondary | ICD-10-CM

## 2024-08-10 DIAGNOSIS — I340 Nonrheumatic mitral (valve) insufficiency: Secondary | ICD-10-CM | POA: Diagnosis not present

## 2024-08-10 NOTE — Patient Instructions (Signed)
 Medication Instructions:  Your physician recommends that you continue on your current medications as directed. Please refer to the Current Medication list given to you today.  *If you need a refill on your cardiac medications before your next appointment, please call your pharmacy*  Lab Work: NONE If you have labs (blood work) drawn today and your tests are completely normal, you will receive your results only by: MyChart Message (if you have MyChart) OR A paper copy in the mail If you have any lab test that is abnormal or we need to change your treatment, we will call you to review the results.  Testing/Procedures: Echocardiogram in 6 months Your physician has requested that you have an echocardiogram. Echocardiography is a painless test that uses sound waves to create images of your heart. It provides your doctor with information about the size and shape of your heart and how well your heart's chambers and valves are working. This procedure takes approximately one hour. There are no restrictions for this procedure. Please do NOT wear cologne, perfume, aftershave, or lotions (deodorant is allowed). Please arrive 15 minutes prior to your appointment time.  Please note: We ask at that you not bring children with you during ultrasound (echo/ vascular) testing. Due to room size and safety concerns, children are not allowed in the ultrasound rooms during exams. Our front office staff cannot provide observation of children in our lobby area while testing is being conducted. An adult accompanying a patient to their appointment will only be allowed in the ultrasound room at the discretion of the ultrasound technician under special circumstances. We apologize for any inconvenience.   Follow-Up: At [facility], you and your health needs are our priority.  As part of our continuing mission to provide you with exceptional heart care, our providers are all part of one team.  This team includes  your primary Cardiologist (physician) and Advanced Practice Providers or APPs (Physician Assistants and Nurse Practitioners) who all work together to provide you with the care you need, when you need it.  Your next appointment:   6 month(s)  Provider:   Lynwood Schilling, MD    We recommend signing up for the patient portal called MyChart.  Sign up information is provided on this After Visit Summary.  MyChart is used to connect with patients for Virtual Visits (Telemedicine).  Patients are able to view lab/test results, encounter notes, upcoming appointments, etc.  Non-urgent messages can be sent to your provider as well.   To learn more about what you can do with MyChart, go to [email or web address].

## 2024-09-09 ENCOUNTER — Ambulatory Visit
Admission: RE | Admit: 2024-09-09 | Discharge: 2024-09-09 | Disposition: A | Discharge status: Home / Self Care | Source: Ambulatory Visit | Attending: Family Medicine | Admitting: Family Medicine

## 2024-09-09 DIAGNOSIS — Z1231 Encounter for screening mammogram for malignant neoplasm of breast: Secondary | ICD-10-CM | POA: Diagnosis present

## 2024-10-04 ENCOUNTER — Other Ambulatory Visit: Payer: Self-pay | Admitting: Family Medicine

## 2024-10-04 DIAGNOSIS — R413 Other amnesia: Secondary | ICD-10-CM

## 2024-10-06 ENCOUNTER — Ambulatory Visit

## 2024-10-06 ENCOUNTER — Telehealth: Payer: Self-pay

## 2024-10-06 ENCOUNTER — Ambulatory Visit: Admitting: Pulmonary Disease

## 2024-10-06 ENCOUNTER — Encounter: Payer: Self-pay | Admitting: Pulmonary Disease

## 2024-10-06 VITALS — BP 106/60 | HR 70 | Temp 97.7°F | Ht 65.0 in | Wt 107.0 lb

## 2024-10-06 DIAGNOSIS — R918 Other nonspecific abnormal finding of lung field: Secondary | ICD-10-CM | POA: Diagnosis not present

## 2024-10-06 DIAGNOSIS — I340 Nonrheumatic mitral (valve) insufficiency: Secondary | ICD-10-CM

## 2024-10-06 DIAGNOSIS — J4540 Moderate persistent asthma, uncomplicated: Secondary | ICD-10-CM | POA: Diagnosis not present

## 2024-10-06 DIAGNOSIS — J4530 Mild persistent asthma, uncomplicated: Secondary | ICD-10-CM

## 2024-10-06 LAB — PULMONARY FUNCTION TEST
DL/VA % pred: 100 %
DL/VA: 4.12 ml/min/mmHg/L
DLCO unc % pred: 88 %
DLCO unc: 17.18 ml/min/mmHg
FEF 25-75 Post: 0.93 L/s
FEF 25-75 Pre: 0.74 L/s
FEF2575-%Change-Post: 24 %
FEF2575-%Pred-Post: 54 %
FEF2575-%Pred-Pre: 43 %
FEV1-%Change-Post: 6 %
FEV1-%Pred-Post: 69 %
FEV1-%Pred-Pre: 65 %
FEV1-Post: 1.54 L
FEV1-Pre: 1.45 L
FEV1FVC-%Change-Post: 4 %
FEV1FVC-%Pred-Pre: 86 %
FEV6-%Change-Post: 2 %
FEV6-%Pred-Post: 80 %
FEV6-%Pred-Pre: 79 %
FEV6-Post: 2.26 L
FEV6-Pre: 2.22 L
FEV6FVC-%Change-Post: 0 %
FEV6FVC-%Pred-Post: 104 %
FEV6FVC-%Pred-Pre: 104 %
FVC-%Change-Post: 2 %
FVC-%Pred-Post: 77 %
FVC-%Pred-Pre: 75 %
FVC-Post: 2.27 L
FVC-Pre: 2.23 L
Post FEV1/FVC ratio: 68 %
Post FEV6/FVC ratio: 100 %
Pre FEV1/FVC ratio: 65 %
Pre FEV6/FVC Ratio: 100 %
RV % pred: 129 %
RV: 3.03 L
TLC % pred: 101 %
TLC: 5.28 L

## 2024-10-06 MED ORDER — BREO ELLIPTA 100-25 MCG/ACT IN AEPB: 1.0000 | INHALATION_SPRAY | Freq: Every day | RESPIRATORY_TRACT | 6 refills | Status: DC

## 2024-10-06 NOTE — Patient Instructions (Signed)
 Full PFT completed today ? ?

## 2024-10-06 NOTE — Progress Notes (Signed)
 Full PFT completed today ? ?

## 2024-10-06 NOTE — Telephone Encounter (Signed)
 Copied from CRM #[id number]. Topic: Clinical - Prescription Issue >> Oct 06, 2024  3:05 PM Corean SAUNDERS wrote: Reason for CRM: Patient states the BREO ELLIPTA  100-25 MCG/ACT AEPB that Dr. Tamea ordered for her today had a $400 co pay and cannot afford that and is requesting an alternative.   Please call patient when this is completed . (Can leave VM)

## 2024-10-06 NOTE — Patient Instructions (Addendum)
 VISIT SUMMARY:  During your visit, we discussed your moderate persistent asthma and reviewed your recent pulmonary function tests. Your lung function has slightly declined, but your symptoms remain minimal. We also discussed your current medications and their potential impact on your asthma.  YOUR PLAN:  -MODERATE PERSISTENT ASTHMA: Moderate persistent asthma is a condition where the airways in your lungs are inflamed and narrowed, causing breathing difficulties. Your recent tests show a slight decline in lung function. We have prescribed a Breo inhaler to be used once daily to help manage your asthma. Please remember to rinse your mouth after using the Breo inhaler to prevent any potential side effects. You may use your emergency inhaler(albuterol ) as needed, up to four times a day in case of shortness of breath or wheezing..  INSTRUCTIONS:  Please follow up in six months for a re-evaluation of your asthma management.

## 2024-10-06 NOTE — Progress Notes (Unsigned)
 "  Subjective:    Patient ID: Kristina Jordan, female    DOB: Nov 14, 1950, 74 y.o.   MRN: [id number]  Patient Care Team: Cyrus Selinda Moose, PA-C as PCP - General (Family Medicine) Lavona Agent, MD as PCP - Cardiology (Cardiology) Dingeldein, Elspeth, MD as Consulting Physician (Ophthalmology) Dasher, Alm LABOR, MD (Dermatology) Tamea Dedra CROME, MD as Consulting Physician (Pulmonary Disease) Lavona Agent, MD as Consulting Physician (Cardiology)  Chief Complaint  Patient presents with   Asthma    Occasional dry cough. Has not used Ventolin .     BACKGROUND/INTERVAL:  HPI   Review of Systems A 10 point review of systems was performed and it is as noted above otherwise negative.   Patient Active Problem List   Diagnosis Date Noted   Pulmonary HTN (HCC) 08/09/2024   Moderate persistent asthma without complication 08/06/2023   Nonrheumatic mitral valve regurgitation 12/25/2022   Weight loss 12/25/2022   Atrial septal aneurysm    Dilated cardiomyopathy (HCC) 11/08/2020   Ganglion cyst of flexor tendon sheath of finger 12/30/2016   Hypercholesterolemia without hypertriglyceridemia 11/19/2015   Avitaminosis D 11/19/2015   Brachial neuritis 04/16/2010   MITRAL INSUFFICIENCY 12/26/2009   MVP (mitral valve prolapse) 12/24/2009   FIBRILLATION, ATRIAL 12/24/2009   Heart disease 11/01/2009   Leg varices 03/19/2009   Acquired trigger finger 01/02/2009   Need for prophylactic hormone replacement therapy (postmenopausal) 10/09/2006   OP (osteoporosis) 10/09/2006    Social History   Tobacco Use   Smoking status: Former    Types: Cigarettes   Smokeless tobacco: Never   Tobacco comments:    only socially in high school.   Substance Use Topics   Alcohol use: No    Allergies[1]  Active Medications[2]  Immunization History  Administered Date(s) Administered    sv, Bivalent, Protein Subunit Rsvpref,pf (Abrysvo) 10/15/2023   Fluad Quad(high Dose 65+) 06/29/2019,  07/20/2020, 07/22/2021   H1N1 10/12/2008   INFLUENZA, HIGH DOSE SEASONAL PF 07/31/2014, 08/02/2015, 07/18/2016, 08/04/2017, 07/14/2018   Influenza Inj Mdck Quad Pf 07/30/2022   Influenza, Mdck, Trivalent,PF 6+ MOS(egg free) 08/03/2023   Pneumococcal Conjugate-13 11/17/2014   Pneumococcal Polysaccharide-23 06/02/2011, 11/07/2016   Tdap 10/21/2010, 07/29/2021   Zoster Recombinant(Shingrix) 10/30/2023, 12/29/2023   Zoster, Live 10/27/2011        Objective:     Vitals:   10/06/24 1011  BP: 106/60  Pulse: 70  Temp: 97.7 F (36.5 C)  Height: 5' 5 (1.651 m)  Weight: 107 lb (48.5 kg)  SpO2: 100%  TempSrc: Temporal  BMI (Calculated): 17.81     SpO2: 100 %  GENERAL: HEAD: Normocephalic, atraumatic.  EYES: Pupils equal, round, reactive to light.  No scleral icterus.  MOUTH:  NECK: Supple. No thyromegaly. Trachea midline. No JVD.  No adenopathy. PULMONARY: Good air entry bilaterally.  No adventitious sounds. CARDIOVASCULAR: S1 and S2. Regular rate and rhythm.  ABDOMEN: MUSCULOSKELETAL: No joint deformity, no clubbing, no edema.  NEUROLOGIC:  SKIN: Intact,warm,dry. PSYCH:        Assessment & Plan:   No diagnosis found.  No orders of the defined types were placed in this encounter.   No orders of the defined types were placed in this encounter.     Advised if symptoms do not improve or worsen, to please contact office for sooner follow up or seek emergency care.    I spent xxx minutes of dedicated to the care of this patient on the date of this encounter to include pre-visit review of records, face-to-face time  with the patient discussing conditions above, post visit ordering of testing, clinical documentation with the electronic health record, making appropriate referrals as documented, and communicating necessary findings to members of the patients care team.     C. Leita Sanders, MD Advanced Bronchoscopy [facility]    *This note  was generated using voice recognition software/Dragon and/or AI transcription program.  Despite best efforts to proofread, errors can occur which can change the meaning. Any transcriptional errors that result from this process are unintentional and may not be fully corrected at the time of dictation.    [1]  Allergies Allergen Reactions   Sulfonamide Derivatives Rash  [2]  Current Meds  Medication Sig   albuterol  (VENTOLIN  HFA) 108 (90 Base) MCG/ACT inhaler Inhale 2 puffs into the lungs every 6 (six) hours as needed for wheezing or shortness of breath.   alendronate  (FOSAMAX ) 70 MG tablet TAKE 1 TABLET BY MOUTH ONCE A WEEK WITH  A  FULL  GLASS  OF  WATER  ON  AN  EMPTY  STOMACH.  DO  NOT  LIE  DOWN  UNTIL  AFTER  BREAKFAST   aspirin  81 MG tablet Take 81 mg by mouth daily.   calcium  carbonate (OSCAL) 1500 (600 Ca) MG TABS tablet Take 1,200 mg by mouth daily with breakfast.   Cholecalciferol  (VITAMIN D ) 50 MCG (2000 UT) tablet Take 4,000 Units by mouth daily.   diltiazem  (CARDIZEM ) 120 MG tablet Take 1 tablet (120 mg total) by mouth daily.   donepezil (ARICEPT) 5 MG tablet Take 5 mg by mouth at bedtime.   propranolol  (INDERAL ) 40 MG tablet Take 1 tablet (40 mg total) by mouth 2 (two) times daily.   Propylene Glycol (SYSTANE COMPLETE) 0.6 % SOLN Place 1 drop into both eyes daily as needed (dry eyes).   "

## 2024-10-07 ENCOUNTER — Telehealth: Payer: Self-pay

## 2024-10-07 ENCOUNTER — Ambulatory Visit
Admission: RE | Admit: 2024-10-07 | Discharge: 2024-10-07 | Disposition: A | Source: Ambulatory Visit | Attending: Family Medicine | Admitting: Family Medicine

## 2024-10-07 ENCOUNTER — Encounter: Payer: Self-pay | Admitting: Pulmonary Disease

## 2024-10-07 ENCOUNTER — Other Ambulatory Visit (HOSPITAL_COMMUNITY): Payer: Self-pay

## 2024-10-07 DIAGNOSIS — J4540 Moderate persistent asthma, uncomplicated: Secondary | ICD-10-CM

## 2024-10-07 DIAGNOSIS — R0602 Shortness of breath: Secondary | ICD-10-CM

## 2024-10-07 DIAGNOSIS — R413 Other amnesia: Secondary | ICD-10-CM | POA: Diagnosis present

## 2024-10-07 DIAGNOSIS — R053 Chronic cough: Secondary | ICD-10-CM

## 2024-10-07 DIAGNOSIS — J4530 Mild persistent asthma, uncomplicated: Secondary | ICD-10-CM

## 2024-10-07 MED ORDER — FLUTICASONE-SALMETEROL 115-21 MCG/ACT IN AERO: 1.0000 | INHALATION_SPRAY | Freq: Two times a day (BID) | RESPIRATORY_TRACT | 6 refills | Status: DC

## 2024-10-07 NOTE — Telephone Encounter (Signed)
*  Pulm  Brand Breo- $400.41 Generic Breo- not covered Generic Advair Diskus/Wixela- $12.00 Brand Advair Diskus- not covered Brand Advair HFA- not covered Generic Advair HFA- $346.63 Generic Symbicort- $141.27 Brand Symbicort- not covered Breyna- not covered

## 2024-10-07 NOTE — Telephone Encounter (Signed)
 Lets try Advair Diskus generic 100/25, 1 inhalation twice a day.

## 2024-10-07 NOTE — Addendum Note (Signed)
 Addended by: Beautiful Pensyl J on: 10/07/2024 01:37 PM   Modules accepted: Orders

## 2024-10-11 NOTE — Telephone Encounter (Signed)
 I have notified the patient. Nothing further needed.

## 2024-10-11 NOTE — Telephone Encounter (Signed)
 Copied from CRM [id number]. Topic: Clinical - Prescription Issue >> Oct 11, 2024  1:57 PM Rilla B wrote: Reason for CRM: Patient states she wants to ask Dr Tamea if she can just continue to use the albuterol  as needed as verses starting the new medication. Please call patient @ [phone].

## 2024-10-11 NOTE — Telephone Encounter (Signed)
 As we discussed during the visit, she has moderate obstruction her pulmonary function test and it is getting worse.  I recommend that she start a maintenance inhaler as prescribed.

## 2024-10-21 MED ORDER — FLUTICASONE-SALMETEROL 100-50 MCG/ACT IN AEPB: 1.0000 | INHALATION_SPRAY | Freq: Two times a day (BID) | RESPIRATORY_TRACT | 5 refills | Status: AC

## 2024-10-21 NOTE — Telephone Encounter (Unsigned)
 Copied from CRM [id number]. Topic: Clinical - Prescription Issue >> Oct 21, 2024 12:11 PM Benton KIDD wrote: Reason for CRM: patient husband is calling because [facility] say they are waiting on more documentation for the prescription advair  Fax [phone]  [employer] ID number [id number]

## 2025-01-25 ENCOUNTER — Ambulatory Visit (HOSPITAL_COMMUNITY)
